# Patient Record
Sex: Female | Born: 1960 | Race: Black or African American | Hispanic: No | Marital: Married | Smoking: Never smoker
Health system: Southern US, Community
[De-identification: ages and names within clinical notes are randomized; demographics above are authoritative.]

## PROBLEM LIST (undated history)

## (undated) DIAGNOSIS — T7840XA Allergy, unspecified, initial encounter: Secondary | ICD-10-CM

## (undated) DIAGNOSIS — G4730 Sleep apnea, unspecified: Secondary | ICD-10-CM

## (undated) DIAGNOSIS — J45909 Unspecified asthma, uncomplicated: Secondary | ICD-10-CM

## (undated) DIAGNOSIS — I10 Essential (primary) hypertension: Secondary | ICD-10-CM

## (undated) DIAGNOSIS — M1990 Unspecified osteoarthritis, unspecified site: Secondary | ICD-10-CM

## (undated) HISTORY — DX: Unspecified osteoarthritis, unspecified site: M19.90

## (undated) HISTORY — DX: Allergy, unspecified, initial encounter: T78.40XA

## (undated) HISTORY — PX: BREAST BIOPSY: SHX20

## (undated) HISTORY — PX: OTHER SURGICAL HISTORY: SHX169

## (undated) HISTORY — DX: Sleep apnea, unspecified: G47.30

## (undated) HISTORY — PX: TRIGGER FINGER RELEASE: SHX641

## (undated) HISTORY — DX: Essential (primary) hypertension: I10

## (undated) HISTORY — DX: Unspecified asthma, uncomplicated: J45.909

---

## 2012-10-27 LAB — HEPATIC FUNCTION PANEL: ALT: 18 (ref 7–35)

## 2012-10-27 LAB — LIPID PANEL
CHOLESTEROL: 156 (ref 0–200)
HDL: 39 (ref 35–70)
LDL CALC: 103
TRIGLYCERIDES: 68 (ref 40–160)

## 2012-10-27 LAB — BASIC METABOLIC PANEL
Creatinine: 0.8 (ref 0.5–1.1)
GLUCOSE: 89
Potassium: 3.8 (ref 3.4–5.3)
SODIUM: 143 (ref 137–147)

## 2012-10-27 LAB — CBC AND DIFFERENTIAL
HEMATOCRIT: 43 (ref 36–46)
HEMOGLOBIN: 15.3 (ref 12.0–16.0)
PLATELETS: 292 (ref 150–399)
WBC: 5.8

## 2012-10-27 LAB — HEMOGLOBIN A1C: Hemoglobin A1C: 4.8

## 2014-05-05 LAB — HM MAMMOGRAPHY

## 2016-11-12 ENCOUNTER — Ambulatory Visit (INDEPENDENT_AMBULATORY_CARE_PROVIDER_SITE_OTHER): Payer: Managed Care, Other (non HMO) | Admitting: Family Medicine

## 2016-11-12 ENCOUNTER — Encounter: Payer: Self-pay | Admitting: Family Medicine

## 2016-11-12 VITALS — BP 130/80 | HR 95 | Resp 12 | Ht 62.0 in | Wt 201.5 lb

## 2016-11-12 DIAGNOSIS — Z6837 Body mass index (BMI) 37.0-37.9, adult: Secondary | ICD-10-CM | POA: Insufficient documentation

## 2016-11-12 DIAGNOSIS — Z9989 Dependence on other enabling machines and devices: Secondary | ICD-10-CM

## 2016-11-12 DIAGNOSIS — I10 Essential (primary) hypertension: Secondary | ICD-10-CM | POA: Diagnosis not present

## 2016-11-12 DIAGNOSIS — J309 Allergic rhinitis, unspecified: Secondary | ICD-10-CM

## 2016-11-12 DIAGNOSIS — G4733 Obstructive sleep apnea (adult) (pediatric): Secondary | ICD-10-CM | POA: Insufficient documentation

## 2016-11-12 DIAGNOSIS — E669 Obesity, unspecified: Secondary | ICD-10-CM | POA: Insufficient documentation

## 2016-11-12 DIAGNOSIS — J302 Other seasonal allergic rhinitis: Secondary | ICD-10-CM | POA: Insufficient documentation

## 2016-11-12 DIAGNOSIS — E876 Hypokalemia: Secondary | ICD-10-CM

## 2016-11-12 DIAGNOSIS — E559 Vitamin D deficiency, unspecified: Secondary | ICD-10-CM | POA: Diagnosis not present

## 2016-11-12 DIAGNOSIS — Z6836 Body mass index (BMI) 36.0-36.9, adult: Secondary | ICD-10-CM

## 2016-11-12 DIAGNOSIS — J3089 Other allergic rhinitis: Secondary | ICD-10-CM

## 2016-11-12 DIAGNOSIS — J4540 Moderate persistent asthma, uncomplicated: Secondary | ICD-10-CM

## 2016-11-12 LAB — BASIC METABOLIC PANEL
BUN: 11 mg/dL (ref 6–23)
CHLORIDE: 107 meq/L (ref 96–112)
CO2: 30 meq/L (ref 19–32)
CREATININE: 0.53 mg/dL (ref 0.40–1.20)
Calcium: 9.3 mg/dL (ref 8.4–10.5)
GFR: 155.07 mL/min (ref >60.00)
Glucose, Bld: 73 mg/dL (ref 70–99)
POTASSIUM: 3.7 meq/L (ref 3.5–5.1)
Sodium: 143 mEq/L (ref 135–145)

## 2016-11-12 LAB — VITAMIN D 25 HYDROXY (VIT D DEFICIENCY, FRACTURES): VITD: 33.06 ng/mL (ref 30.00–100.00)

## 2016-11-12 MED ORDER — ALBUTEROL SULFATE HFA 108 (90 BASE) MCG/ACT IN AERS: 2.0000 | INHALATION_SPRAY | Freq: Four times a day (QID) | RESPIRATORY_TRACT | 1 refills | Status: DC | PRN

## 2016-11-12 MED ORDER — FLUTICASONE-SALMETEROL 250-50 MCG/DOSE IN AEPB: 1.0000 | INHALATION_SPRAY | Freq: Two times a day (BID) | RESPIRATORY_TRACT | 2 refills | Status: DC

## 2016-11-12 NOTE — Progress Notes (Signed)
Pre visit review using our clinic review tool, if applicable. No additional management support is needed unless otherwise documented below in the visit note. 

## 2016-11-12 NOTE — Progress Notes (Signed)
HPI:   [name] is a 56 y.o. female, who is here today to establish care with me.  Former PCP: [place], she just moved to the area in 06/2016. Last preventive routine visit: 11/2015. Pap smear 2017 negative. Colonoscopy at age 28, negative per pt report.   Chronic medical problems: HTN,asthma,OSA,allergies, vit D deficiency among some.  She lives with her husband. Her son lives in [place].  Concerns today: Med refills.   Hypertension:  Dx in her mid 89's.  Currently on HCTZ 25 mg daily and amlodipine 10 mg daily. HypoK+ on KCL 10 meq daily.   She is taking medications as instructed, no side effects reported.  She has not noted unusual headache, visual changes, exertional chest pain, dyspnea,  focal weakness, or edema.  She has not been consistent with regular exercise and a healthy diet since she moved, October last year, planning on going back to a healthier lifestyle.  OSA on CPAP.  Vitamin D deficiency: Currently she is on OTC vitamin D supplementation 2000 units daily.  Asthma: Currently she is on albuterol inhaler, which she uses as needed. Last exacerbation after about a year of not having one was about 1-2 weeks ago. She is on Advair 250-50 mcg bid.  She is also on Singulair 10 mg daily and Flonase intranasal spray daily. She takes Allegra and 80 mg daily.    Review of Systems  Constitutional: Negative for activity change, appetite change, fatigue, fever and unexpected weight change.  HENT: Negative for mouth sores, nosebleeds, sinus pain, sore throat, trouble swallowing and voice change.   Eyes: Negative for pain, redness and visual disturbance.  Respiratory: Negative for cough, shortness of breath and wheezing.   Cardiovascular: Negative for chest pain, palpitations and leg swelling.  Gastrointestinal: Negative for abdominal pain, nausea and vomiting.       Negative for changes in bowel habits.  Genitourinary: Negative for  decreased urine volume and hematuria.  Musculoskeletal: Negative for gait problem and myalgias.  Skin: Negative for rash.  Allergic/Immunologic: Positive for environmental allergies.  Neurological: Negative for syncope, weakness and headaches.  Psychiatric/Behavioral: Negative for confusion. The patient is not nervous/anxious.       No current outpatient prescriptions on file prior to visit.   No current facility-administered medications on file prior to visit.      Past Medical History:  Diagnosis Date  . Allergy   . Hypertension    Not on File  Family History  Problem Relation Age of Onset  . Hyperlipidemia Mother   . Hypertension Mother   . Hypertension Father   . Hyperlipidemia Father   . Diabetes Sister   . Cancer Sister     breast  . Diabetes Sister   . Cancer Sister     breast    Social History   Social History  . Marital status: Married    Spouse name: N/A  . Number of children: N/A  . Years of education: N/A   Social History Main Topics  . Smoking status: Never Smoker  . Smokeless tobacco: Never Used  . Alcohol use None  . Drug use: No  . Sexual activity: Yes   Other Topics Concern  . None   Social History Narrative  . None    Vitals:   11/12/16 0855  BP: 130/80  Pulse: 95  Resp: 12  O2 sat at RA 98%.  Body mass index is 36.85 kg/m.   Physical Exam  Nursing note and  vitals reviewed. Constitutional: She is oriented to person, place, and time. She appears well-developed. No distress.  HENT:  Head: Atraumatic.  Mouth/Throat: Oropharynx is clear and moist and mucous membranes are normal.  Hypertrophic turbinates.  Eyes: Conjunctivae and EOM are normal. Pupils are equal, round, and reactive to light.  Cardiovascular: Normal rate and regular rhythm.   No murmur heard. Pulses:      Dorsalis pedis pulses are 2+ on the right side, and 2+ on the left side.  Respiratory: Effort normal and breath sounds normal. No respiratory distress.    GI: Soft. She exhibits no mass. There is no hepatomegaly. There is no tenderness.  Musculoskeletal: She exhibits no edema or tenderness.  Lymphadenopathy:    She has no cervical adenopathy.  Neurological: She is alert and oriented to person, place, and time. She has normal strength. Coordination and gait normal.  Skin: Skin is warm. No erythema.  Psychiatric: She has a normal mood and affect.  Well groomed, good eye contact.      ASSESSMENT AND PLAN:   Wanda Thornton was seen today for establish care.  Diagnoses and all orders for this visit:  Asthma, extrinsic, moderate persistent, uncomplicated  Overall well controlled. No changes in current management. Given her history of OSA, referral to pulmonology was placed.  -     Ambulatory referral to Pulmonology -     albuterol (PROVENTIL HFA;VENTOLIN HFA) 108 (90 Base) MCG/ACT inhaler; Inhale 2 puffs into the lungs every 6 (six) hours as needed for wheezing or shortness of breath. -     Fluticasone-Salmeterol (ADVAIR) 250-50 MCG/DOSE AEPB; Inhale 1 puff into the lungs 2 (two) times daily.  Vitamin D deficiency  No changes in current management, will follow labs done today and will give further recommendations accordingly.  -     VITAMIN D 25 Hydroxy (Vit-D Deficiency, Fractures)  Hypertension, essential, benign  Adequately controlled. No changes in current management. DASH-low salt diet recommended. Eye exam recommended annually. F/U in 6 months, before if needed.  -     Basic metabolic panel  Chronic allergic rhinitis, unspecified seasonality, unspecified trigger  Stable. Continue current management. Follow-up in 6 months.  OSA on CPAP  She will continue following with pulmonologist.  -     Ambulatory referral to Pulmonology  BMI 36.0-36.9,adult  We discussed benefits of wt loss as well as adverse effects of obesity. Consistency with healthy diet and physical activity recommended.  Hypokalemia  No changes in  current management, will follow labs done today and will give further recommendations accordingly.     Betty G. Martinique, MD  [facility]. [place] office.

## 2016-11-12 NOTE — Patient Instructions (Signed)
A few things to remember from today's visit:   OSA on CPAP - Plan: Ambulatory referral to Pulmonology  Vitamin D deficiency - Plan: VITAMIN D 25 Hydroxy (Vit-D Deficiency, Fractures)  Asthma, extrinsic, moderate persistent, uncomplicated - Plan: Ambulatory referral to Pulmonology, albuterol (PROVENTIL HFA;VENTOLIN HFA) 108 (90 Base) MCG/ACT inhaler, Fluticasone-Salmeterol (ADVAIR) 250-50 MCG/DOSE AEPB  Chronic allergic rhinitis, unspecified seasonality, unspecified trigger  Hypertension, essential, benign - Plan: Basic metabolic panel  No changes today.  We have ordered labs or studies at this visit.  It can take up to 1-2 weeks for results and processing. IF results require follow up or explanation, we will call you with instructions. Clinically stable results will be released to your [facility]. If you have not heard from [place] or cannot find your results in [facility] in 2 weeks please contact our office at [phone].  If you are not yet signed up for [facility], please consider signing up  Please be sure medication list is accurate. If a new problem present, please set up appointment sooner than planned today.

## 2016-12-25 ENCOUNTER — Institutional Professional Consult (permissible substitution): Payer: Managed Care, Other (non HMO) | Admitting: Pulmonary Disease

## 2017-02-05 ENCOUNTER — Other Ambulatory Visit: Payer: Self-pay

## 2017-02-05 DIAGNOSIS — J4540 Moderate persistent asthma, uncomplicated: Secondary | ICD-10-CM

## 2017-02-05 MED ORDER — FLUTICASONE PROPIONATE 50 MCG/ACT NA SUSP: 2.0000 | Freq: Every day | NASAL | 1 refills | Status: DC

## 2017-02-05 MED ORDER — FLUTICASONE-SALMETEROL 250-50 MCG/DOSE IN AEPB: 1.0000 | INHALATION_SPRAY | Freq: Two times a day (BID) | RESPIRATORY_TRACT | 1 refills | Status: DC

## 2017-02-13 ENCOUNTER — Other Ambulatory Visit (INDEPENDENT_AMBULATORY_CARE_PROVIDER_SITE_OTHER): Payer: 59

## 2017-02-13 ENCOUNTER — Ambulatory Visit (INDEPENDENT_AMBULATORY_CARE_PROVIDER_SITE_OTHER): Payer: 59 | Admitting: Internal Medicine

## 2017-02-13 ENCOUNTER — Encounter: Payer: Self-pay | Admitting: Internal Medicine

## 2017-02-13 VITALS — BP 128/88 | HR 90 | Resp 16 | Ht 62.0 in | Wt 206.8 lb

## 2017-02-13 DIAGNOSIS — G4733 Obstructive sleep apnea (adult) (pediatric): Secondary | ICD-10-CM

## 2017-02-13 DIAGNOSIS — J4540 Moderate persistent asthma, uncomplicated: Secondary | ICD-10-CM

## 2017-02-13 DIAGNOSIS — J3089 Other allergic rhinitis: Secondary | ICD-10-CM | POA: Diagnosis not present

## 2017-02-13 DIAGNOSIS — J4530 Mild persistent asthma, uncomplicated: Secondary | ICD-10-CM | POA: Diagnosis not present

## 2017-02-13 DIAGNOSIS — J302 Other seasonal allergic rhinitis: Secondary | ICD-10-CM

## 2017-02-13 DIAGNOSIS — Z9989 Dependence on other enabling machines and devices: Secondary | ICD-10-CM | POA: Diagnosis not present

## 2017-02-13 LAB — CBC WITH DIFFERENTIAL/PLATELET
BASOS ABS: 0.1 10*3/uL (ref 0.0–0.1)
Basophils Relative: 1.1 % (ref 0.0–3.0)
Eosinophils Absolute: 0.3 10*3/uL (ref 0.0–0.7)
Eosinophils Relative: 3.1 % (ref 0.0–5.0)
HCT: 43.5 % (ref 36.0–46.0)
Hemoglobin: 15.3 g/dL — ABNORMAL HIGH (ref 12.0–15.0)
LYMPHS PCT: 26.5 % (ref 12.0–46.0)
Lymphs Abs: 2.5 10*3/uL (ref 0.7–4.0)
MCHC: 35.1 g/dL (ref 30.0–36.0)
MCV: 89.7 fl (ref 78.0–100.0)
Monocytes Absolute: 0.8 10*3/uL (ref 0.1–1.0)
Monocytes Relative: 8.4 % (ref 3.0–12.0)
NEUTROS PCT: 60.9 % (ref 43.0–77.0)
Neutro Abs: 5.8 10*3/uL (ref 1.4–7.7)
PLATELETS: 332 10*3/uL (ref 150.0–400.0)
RBC: 4.85 Mil/uL (ref 3.87–5.11)
RDW: 13.9 % (ref 11.5–15.5)
WBC: 9.6 10*3/uL (ref 4.0–10.5)

## 2017-02-13 NOTE — Patient Instructions (Addendum)
Please see if you can get [place] contact information for [facility] so we can request a copy of your original diagnostic HST sleep study from around 2013. We will have you sign a record release.  Order- new DME to continue CPAP auto 5-15, mask of choice, humidifier, supplies, AirView or download card  Dx OSA  Order- office spirometry    Dx Asthma mild persistent  Order lab- CBC w diff, Allergy profile      Dx Asthma mild persistent

## 2017-02-13 NOTE — Assessment & Plan Note (Signed)
We are requesting her original diagnostic sleep study results for documentation. She is well-controlled and clearly benefiting from CPAP with no changes required. She does need to establish with a DME company. Plan-continue CPAP auto 5-15

## 2017-02-13 NOTE — Assessment & Plan Note (Signed)
Current control measures with environmental precautions, Flonase, Singulair and Zyrte appear to be sufficient

## 2017-02-13 NOTE — Progress Notes (Signed)
02/13/17-56 year old female never smoker consult pulmonary to est care with Dr. Annamaria Boots referring Dr is Dr. Betty Martinique. Medical history of asthma.and obstructive sleep apnea. We have requested diagnostic npsg records. Original home sleep test in [place] at [place] around 2013. She had an unattended sleep study in 2015 but couldn't sleep. Original problems were loud witnessed snoring, nonrestorative sleep and daytime sleepiness. She has been using CPAP since 2013 continuously and reports she sleeps well with this with no snoring and much less daytime sleepiness. Download indicates 100% 4 hour compliance with pressure auto 5-15 and AHI 1.4/hour. Nasal mask. This machine is about 13-year-old. She does need to establish with a local DME company for supplies. ENT surgery-none. She denies history of heart disease. Second Problem: Asthma-adult onset, associated with environmental allergy in [facility] including olive trees. Skin testing was positive for grass, trees and weeds. Qvar was insufficient but Advair 250 controls her quite well, needing rescue inhaler only rarely. She recognizes seasonal and perennial allergic rhinitis symptoms with itching of eyes and nose, nasal congestion and drainage. These are usually controlled with Flonase, Singulair and Zyrtec. 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 Office Spirometry-  Prior to Admission medications   Medication Sig Start Date End Date Taking? Authorizing Provider  albuterol (PROVENTIL HFA;VENTOLIN HFA) 108 (90 Base) MCG/ACT inhaler Inhale 2 puffs into the lungs every 6 (six) hours as needed for wheezing or shortness of breath. 11/12/16  Yes Martinique, Betty G, MD  amLODipine (NORVASC) 10 MG tablet Take 10 mg by mouth daily.   Yes [provider]  CALCIUM CITRATE PO Take 2 tablets by mouth 2 (two) times daily.   Yes [provider]  fexofenadine (ALLEGRA) 180  MG tablet Take 180 mg by mouth daily.   Yes [provider]  fluticasone (FLONASE) 50 MCG/ACT nasal spray Place 2 sprays into both nostrils daily. 02/05/17  Yes Martinique, Betty G, MD  Fluticasone-Salmeterol (ADVAIR) 250-50 MCG/DOSE AEPB Inhale 1 puff into the lungs 2 (two) times daily. 02/05/17  Yes Martinique, Betty G, MD  hydrochlorothiazide (HYDRODIURIL) 25 MG tablet Take 25 mg by mouth daily.   Yes [provider]  montelukast (SINGULAIR) 10 MG tablet Take 10 mg by mouth at bedtime.   Yes [provider]  potassium chloride (K-DUR) 10 MEQ tablet Take 10 mEq by mouth 2 (two) times daily.   Yes [provider]   Past Medical History:  Diagnosis Date  . Allergy   . Hypertension    No past surgical history on file. Family History  Problem Relation Age of Onset  . Hyperlipidemia Mother   . Hypertension Mother   . Hypertension Father   . Hyperlipidemia Father   . Diabetes Sister   . Cancer Sister        breast  . Diabetes Sister   . Cancer Sister        breast   Social History   Social History  . Marital status: Married    Spouse name: N/A  . Number of children: N/A  . Years of education: N/A   Occupational History  . Not on file.   Social History Main Topics  . Smoking status: Never Smoker  . Smokeless tobacco: Never Used  . Alcohol use No  . Drug use: No  . Sexual activity: Yes   Other Topics Concern  . Not on file   Social History Narrative  .  No narrative on file   ROS-see HPI    "+" = POS Constitutional:    weight loss, night sweats, fevers, chills, fatigue, lassitude. HEENT:    headaches, difficulty swallowing, tooth/dental problems, sore throat,       + sneezing, itching, ear ache, + nasal congestion, post nasal drip, snoring CV:    chest pain, orthopnea, PND, swelling in lower extremities, anasarca,                                                         dizziness, palpitations Resp:   + shortness of breath with exertion or at  rest.                productive cough,   non-productive cough, coughing up of blood.              change in color of mucus.  wheezing.   Skin:    rash or lesions. GI:  No-   heartburn, indigestion, abdominal pain, nausea, vomiting, diarrhea,                 change in bowel habits, loss of appetite GU: dysuria, change in color of urine, no urgency or frequency.   flank pain. MS:   joint pain, stiffness, decreased range of motion, back pain. Neuro-     nothing unusual Psych:  change in mood or affect.  depression or anxiety.   memory loss.  OBJ- Physical Exam General- Alert, Oriented, Affect-appropriate, Distress- none acute, + overweight Skin- rash-none, lesions- none, excoriation- none Lymphadenopathy- none Head- atraumatic            Eyes- Gross vision intact, PERRLA, conjunctivae and secretions clear            Ears- Hearing, canals-normal            Nose- + turbinate edema, no-Septal dev, mucus, polyps, erosion, perforation             Throat- Mallampati III-IV , mucosa clear , drainage- none, tonsils- atrophic Neck- flexible , trachea midline, no stridor , thyroid nl, carotid no bruit Chest - symmetrical excursion , unlabored           Heart/CV- RRR , no murmur , no gallop  , no rub, nl s1 s2                           - JVD- none , edema- none, stasis changes- none, varices- none           Lung- clear to P&A, wheeze- none, cough- none , dullness-none, rub- none           Chest wall-  Abd-  Br/ Gen/ Rectal- Not done, not indicated Extrem- cyanosis- none, clubbing, none, atrophy- none, strength- nl Neuro- grossly intact to observation

## 2017-02-13 NOTE — Assessment & Plan Note (Signed)
Control has been good with Advair and occasional use of rescue inhaler. Plan-Office Spirometry, labs to establish importance of allergic triggers-CBC with differential and allergy profile

## 2017-02-14 LAB — RESPIRATORY ALLERGY PROFILE REGION II ~~LOC~~
Allergen, C. Herbarum, M2: <0.1 kU/L
Allergen, Comm Silver Birch, t9: <0.1 kU/L
Allergen, Cottonwood, t14: <0.1 kU/L
Allergen, D pternoyssinus,d7: <0.1 kU/L
Allergen, Mulberry, t76: <0.1 kU/L
Allergen, P. notatum, m1: <0.1 kU/L
Aspergillus fumigatus, m3: <0.1 kU/L
Bermuda Grass: <0.1 kU/L
Cockroach: <0.1 kU/L
Common Ragweed: <0.1 kU/L
Dog Dander: <0.1 kU/L
Elm IgE: 0.14 kU/L — ABNORMAL HIGH
IgE (Immunoglobulin E), Serum: 66 kU/L (ref <115)
Johnson Grass: <0.1 kU/L
Pecan/Hickory Tree IgE: <0.1 kU/L
Rough Pigweed  IgE: <0.1 kU/L
Timothy Grass: 2.74 kU/L — ABNORMAL HIGH

## 2017-02-14 NOTE — Addendum Note (Signed)
Addended by: Parke Poisson E on: 02/14/2017 04:28 PM   Modules accepted: Orders

## 2017-02-14 NOTE — Addendum Note (Signed)
Addended by: Clayborne Dana C on: 02/14/2017 02:50 PM   Modules accepted: Orders

## 2017-02-21 ENCOUNTER — Telehealth: Payer: Self-pay | Admitting: Internal Medicine

## 2017-02-21 NOTE — Telephone Encounter (Signed)
Notes recorded by Deneise Lever, MD on 02/17/2017 at 10:31 AM EDT Allergy labs- - increased allergy antibodies to grass pollen and Elm tree pollen. Total allergy antibodies are not very high. This suggests symptoms may bother mainly in Spring pollen season, but shouldn't be a big issue interfering with CPAP.   Spoke with patient regarding results. Patient verbalized understanding. Nothing further was needed at time of call.

## 2017-05-06 ENCOUNTER — Other Ambulatory Visit: Payer: Self-pay

## 2017-05-06 DIAGNOSIS — J4540 Moderate persistent asthma, uncomplicated: Secondary | ICD-10-CM

## 2017-05-06 MED ORDER — MONTELUKAST SODIUM 10 MG PO TABS: 10.0000 mg | ORAL_TABLET | Freq: Every day | ORAL | 2 refills | Status: DC

## 2017-05-26 ENCOUNTER — Telehealth: Payer: Self-pay | Admitting: Family Medicine

## 2017-05-26 NOTE — Telephone Encounter (Signed)
Pt brought disk of medical records for Wanda Thornton to copy. Please call pt when done so she can come pick it up. [phone]. cb

## 2017-05-28 NOTE — Telephone Encounter (Signed)
Pts husband state the pass code is her date birth

## 2017-06-03 ENCOUNTER — Encounter: Payer: Self-pay | Admitting: Family Medicine

## 2017-06-03 NOTE — Telephone Encounter (Signed)
Pt husband came by and picked up CD's.

## 2017-06-03 NOTE — Telephone Encounter (Signed)
I called and let patient's husband know that the CD's are up front & ready to be picked up.

## 2017-06-17 ENCOUNTER — Telehealth: Payer: Self-pay

## 2017-06-17 ENCOUNTER — Ambulatory Visit (INDEPENDENT_AMBULATORY_CARE_PROVIDER_SITE_OTHER): Payer: 59 | Admitting: Internal Medicine

## 2017-06-17 ENCOUNTER — Encounter: Payer: Self-pay | Admitting: Internal Medicine

## 2017-06-17 DIAGNOSIS — J302 Other seasonal allergic rhinitis: Secondary | ICD-10-CM | POA: Diagnosis not present

## 2017-06-17 DIAGNOSIS — Z23 Encounter for immunization: Secondary | ICD-10-CM

## 2017-06-17 DIAGNOSIS — G4733 Obstructive sleep apnea (adult) (pediatric): Secondary | ICD-10-CM | POA: Diagnosis not present

## 2017-06-17 DIAGNOSIS — J4540 Moderate persistent asthma, uncomplicated: Secondary | ICD-10-CM

## 2017-06-17 DIAGNOSIS — Z1231 Encounter for screening mammogram for malignant neoplasm of breast: Secondary | ICD-10-CM

## 2017-06-17 DIAGNOSIS — Z9989 Dependence on other enabling machines and devices: Secondary | ICD-10-CM

## 2017-06-17 DIAGNOSIS — J3089 Other allergic rhinitis: Secondary | ICD-10-CM

## 2017-06-17 MED ORDER — ALBUTEROL SULFATE HFA 108 (90 BASE) MCG/ACT IN AERS: 2.0000 | INHALATION_SPRAY | Freq: Four times a day (QID) | RESPIRATORY_TRACT | 3 refills | Status: DC | PRN

## 2017-06-17 NOTE — Assessment & Plan Note (Signed)
She was recently back in [place] where local environmental triggers were again noted. Back in [place], she is mostly noticing spring seasonal rhinitis symptoms which is consistent with her allergy profile blood work. Plan-symptomatic therapy with Flonase and Claritin as needed.

## 2017-06-17 NOTE — Telephone Encounter (Signed)
Order can be placed. She can have it done wherever is more convenient for her. I know [facility].  Thanks, BJ

## 2017-06-17 NOTE — Assessment & Plan Note (Signed)
She is describing mild, intermittent, uncomplicated asthma pattern currently. Advair works well for her.

## 2017-06-17 NOTE — Assessment & Plan Note (Signed)
She sleeps better with CPAP and download confirms excellent compliance and control. No changes are needed. Plan-continue auto 5-15

## 2017-06-17 NOTE — Progress Notes (Signed)
[birth year]/17-56 year old female never smoker consult pulmonary to est care with Dr. Annamaria Boots referring Dr is Dr. Betty Martinique. Medical history of asthma.and obstructive sleep apnea. We have requested diagnostic npsg records. Original home sleep test in [place] at [place] around 2013. She had an unattended sleep study in 2015 but couldn't sleep. Original problems were loud witnessed snoring, nonrestorative sleep and daytime sleepiness. She has been using CPAP since 2013 continuously and reports she sleeps well with this with no snoring and much less daytime sleepiness. Download indicates 100% 4 hour compliance with pressure auto 5-15 and AHI 1.4/hour. Nasal mask. This machine is about 40-year-old. She does need to establish with a local DME company for supplies. ENT surgery-none. She denies history of heart disease. Second Problem: Asthma-adult onset, associated with environmental allergy in [facility] including olive trees. Skin testing was positive for grass, trees and weeds. Qvar was insufficient but Advair 250 controls her quite well, needing rescue inhaler only rarely. She recognizes seasonal and perennial allergic rhinitis symptoms with itching of eyes and nose, nasal congestion and drainage. These are usually controlled with Flonase, Singulair and Zyrtec. 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 Office Spirometry- WNL ratio 90%  06/17/17-56 year old female never smoker followed for asthma, allergic rhinitis,  OSA CPAP auto 5-15/ FOLLOWS FOR: DME: [facility]. Pt wears CPAP nightly. DL attached.  Allergy Profile 02/13/17- total IgE 66, specific elevations for Timothy grass and Elm pollen. Eosinophils normal 0.3 K/UL Download confirms 100% compliance averaging 7 hours 46 minutes with AHI 1.6/hour. She says she sleeps "so much better" with CPAP. No more daytime sleepiness. She still feels Advair doesn't nice job of controlling  asthma. No recent wheezing. She has Flonase and Claritin available if needed for spring allergy season.  ROS-see HPI    "+" = POS Constitutional:    weight loss, night sweats, fevers, chills, fatigue, lassitude. HEENT:    headaches, difficulty swallowing, tooth/dental problems, sore throat,        sneezing, itching, ear ache,  nasal congestion, post nasal drip, snoring CV:    chest pain, orthopnea, PND, swelling in lower extremities, anasarca,                                                         dizziness, palpitations Resp:   + shortness of breath with exertion or at rest.                productive cough,   non-productive cough, coughing up of blood.              change in color of mucus.  wheezing.   Skin:    rash or lesions. GI:  No-   heartburn, indigestion, abdominal pain, nausea, vomiting, diarrhea,                 change in bowel habits, loss of appetite GU: dysuria, change in color of urine, no urgency or frequency.   flank pain. MS:   joint pain, stiffness, decreased range of motion, back pain. Neuro-     nothing unusual Psych:  change in mood or affect.  depression or anxiety.   memory loss.  OBJ- Physical Exam  stable baseline exam. She looks comfortable. General- Alert, Oriented, Affect-appropriate, Distress- none  acute, + overweight Skin- rash-none, lesions- none, excoriation- none Lymphadenopathy- none Head- atraumatic            Eyes- Gross vision intact, PERRLA, conjunctivae and secretions clear            Ears- Hearing, canals-normal            Nose- + turbinate edema, no-Septal dev, mucus, polyps, erosion, perforation             Throat- Mallampati III-IV , mucosa clear , drainage- none, tonsils- atrophic Neck- flexible , trachea midline, no stridor , thyroid nl, carotid no bruit Chest - symmetrical excursion , unlabored           Heart/CV- RRR , no murmur , no gallop  , no rub, nl s1 s2                           - JVD- none , edema- none, stasis changes- none,  varices- none           Lung- clear to P&A, wheeze- none, cough- none , dullness-none, rub- none           Chest wall-  Abd-  Br/ Gen/ Rectal- Not done, not indicated Extrem- cyanosis- none, clubbing, none, atrophy- none, strength- nl Neuro- grossly intact to observation

## 2017-06-17 NOTE — Telephone Encounter (Signed)
Patient called to state that she is due for annual mammogram. She would like to know where you would recommend she go and if you could order.  Dr. Martinique - Please advise. Thanks!

## 2017-06-17 NOTE — Patient Instructions (Signed)
Flu vax standard  Script sent refilling ProAir  Ok to continue Advair  We can continue CPAP auto 5-15, mask of choice, humidifier, supplies, AirView for dx OSA  Please call if we can help

## 2017-06-18 NOTE — Telephone Encounter (Signed)
LMTCB to find out if pt has preference for imaging location for mammo

## 2017-06-18 NOTE — Telephone Encounter (Signed)
PT called back and had no preference. Order placed with [facility]. Pt aware someone will call her to schedule. Nothing further needed at this time.

## 2017-06-30 ENCOUNTER — Ambulatory Visit
Admission: RE | Admit: 2017-06-30 | Discharge: 2017-06-30 | Disposition: A | Discharge status: Home / Self Care | Payer: 59 | Source: Ambulatory Visit | Attending: Family Medicine | Admitting: Family Medicine

## 2017-06-30 DIAGNOSIS — Z1231 Encounter for screening mammogram for malignant neoplasm of breast: Secondary | ICD-10-CM

## 2017-08-04 ENCOUNTER — Other Ambulatory Visit: Payer: Self-pay | Admitting: Family Medicine

## 2017-08-14 ENCOUNTER — Other Ambulatory Visit: Payer: Self-pay | Admitting: Family Medicine

## 2017-08-14 DIAGNOSIS — J4540 Moderate persistent asthma, uncomplicated: Secondary | ICD-10-CM

## 2017-08-15 ENCOUNTER — Other Ambulatory Visit: Payer: Self-pay | Admitting: *Deleted

## 2017-08-15 DIAGNOSIS — E876 Hypokalemia: Secondary | ICD-10-CM

## 2017-08-15 DIAGNOSIS — I10 Essential (primary) hypertension: Secondary | ICD-10-CM

## 2017-08-15 MED ORDER — POTASSIUM CHLORIDE ER 10 MEQ PO TBCR: 10.0000 meq | EXTENDED_RELEASE_TABLET | Freq: Two times a day (BID) | ORAL | 2 refills | Status: DC

## 2017-08-15 MED ORDER — HYDROCHLOROTHIAZIDE 25 MG PO TABS: 25.0000 mg | ORAL_TABLET | Freq: Every day | ORAL | 2 refills | Status: DC

## 2017-08-15 MED ORDER — AMLODIPINE BESYLATE 10 MG PO TABS: 10.0000 mg | ORAL_TABLET | Freq: Every day | ORAL | 2 refills | Status: DC

## 2017-08-26 ENCOUNTER — Other Ambulatory Visit: Payer: Self-pay | Admitting: *Deleted

## 2017-08-26 DIAGNOSIS — E876 Hypokalemia: Secondary | ICD-10-CM

## 2017-08-26 MED ORDER — POTASSIUM CHLORIDE ER 10 MEQ PO TBCR: 10.0000 meq | EXTENDED_RELEASE_TABLET | Freq: Two times a day (BID) | ORAL | 2 refills | Status: DC

## 2018-01-26 ENCOUNTER — Other Ambulatory Visit: Payer: Self-pay | Admitting: Family Medicine

## 2018-01-26 DIAGNOSIS — J4540 Moderate persistent asthma, uncomplicated: Secondary | ICD-10-CM

## 2018-02-21 ENCOUNTER — Other Ambulatory Visit: Payer: Self-pay | Admitting: Family Medicine

## 2018-02-21 DIAGNOSIS — J4540 Moderate persistent asthma, uncomplicated: Secondary | ICD-10-CM

## 2018-03-11 ENCOUNTER — Encounter: Payer: Self-pay | Admitting: Family Medicine

## 2018-03-11 ENCOUNTER — Ambulatory Visit (INDEPENDENT_AMBULATORY_CARE_PROVIDER_SITE_OTHER): Payer: 59 | Admitting: Family Medicine

## 2018-03-11 VITALS — BP 120/82 | HR 95 | Temp 98.3°F | Resp 12 | Ht 62.0 in | Wt 200.5 lb

## 2018-03-11 DIAGNOSIS — Z13220 Encounter for screening for lipoid disorders: Secondary | ICD-10-CM | POA: Diagnosis not present

## 2018-03-11 DIAGNOSIS — J4540 Moderate persistent asthma, uncomplicated: Secondary | ICD-10-CM

## 2018-03-11 DIAGNOSIS — Z0000 Encounter for general adult medical examination without abnormal findings: Secondary | ICD-10-CM | POA: Diagnosis not present

## 2018-03-11 DIAGNOSIS — Z9189 Other specified personal risk factors, not elsewhere classified: Secondary | ICD-10-CM | POA: Diagnosis not present

## 2018-03-11 DIAGNOSIS — I10 Essential (primary) hypertension: Secondary | ICD-10-CM

## 2018-03-11 DIAGNOSIS — Z1159 Encounter for screening for other viral diseases: Secondary | ICD-10-CM | POA: Diagnosis not present

## 2018-03-11 DIAGNOSIS — Z131 Encounter for screening for diabetes mellitus: Secondary | ICD-10-CM

## 2018-03-11 LAB — COMPREHENSIVE METABOLIC PANEL
ALBUMIN: 4.3 g/dL (ref 3.5–5.2)
ALT: 15 U/L (ref 0–35)
AST: 16 U/L (ref 0–37)
Alkaline Phosphatase: 105 U/L (ref 39–117)
BUN: 12 mg/dL (ref 6–23)
CALCIUM: 9.4 mg/dL (ref 8.4–10.5)
CHLORIDE: 103 meq/L (ref 96–112)
CO2: 30 meq/L (ref 19–32)
Creatinine, Ser: 0.71 mg/dL (ref 0.40–1.20)
GFR: 110.11 mL/min (ref >60.00)
Glucose, Bld: 90 mg/dL (ref 70–99)
POTASSIUM: 3.4 meq/L — AB (ref 3.5–5.1)
Sodium: 142 mEq/L (ref 135–145)
Total Bilirubin: 1 mg/dL (ref 0.2–1.2)
Total Protein: 7.1 g/dL (ref 6.0–8.3)

## 2018-03-11 LAB — LIPID PANEL
CHOLESTEROL: 170 mg/dL (ref 0–200)
HDL: 36.5 mg/dL — AB (ref >39.00)
LDL CALC: 117 mg/dL — AB (ref 0–99)
NonHDL: 133.39
TRIGLYCERIDES: 83 mg/dL (ref 0.0–149.0)
Total CHOL/HDL Ratio: 5
VLDL: 16.6 mg/dL (ref 0.0–40.0)

## 2018-03-11 LAB — TSH: TSH: 1.45 u[IU]/mL (ref 0.35–4.50)

## 2018-03-11 MED ORDER — MONTELUKAST SODIUM 10 MG PO TABS: 10.0000 mg | ORAL_TABLET | Freq: Every day | ORAL | 3 refills | Status: DC

## 2018-03-11 NOTE — Progress Notes (Signed)
HPI:   Wanda Thornton is a 57 y.o. female, who is here today for her routine physical.  Last CPE: 11/2015  Regular exercise 3 or more time per week: Not consistently due to work schedule. Following a healthy diet: Yes She lives with her husband.  Chronic medical problems: Vit D def,asthma,HTN,OSA,and allergic rhinitis among some.  Pap smear done last in 11/2015, negative. Hx of abnormal pap smears: Negative. Hx of STD's: not in years, was treated for STD in her early 20's.  M: 17 LMP hysterectomy 2005 because fibroid.   Immunization History  Administered Date(s) Administered  . Influenza,inj,Quad PF,6+ Mos 10/16/1998, 06/17/2017  . Pneumococcal Polysaccharide-23 11/07/2015  . Tdap 09/15/2009    Mammogram: 06/2017 Colonoscopy: 4 years ago, DEXA: N/A  Hep C screening: Not done before.  She has no concerns today.  HTN: She is on HCTZ 25 mg daily and Amlodipine 10 mg daily.  Asthma: On Albuterol inh and Advair 250-50 mcg bid. She is also on Singulair 10 mg daily. She does not need Albuterol inh often.  Tolerating medications, no side effects reported.  Review of Systems  Constitutional: Negative for appetite change, fatigue and fever.  HENT: Negative for hearing loss, mouth sores, trouble swallowing and voice change.   Eyes: Negative for redness and visual disturbance.  Respiratory: Negative for cough, shortness of breath and wheezing.   Cardiovascular: Negative for chest pain and leg swelling.  Gastrointestinal: Negative for abdominal pain, nausea and vomiting.       No changes in bowel habits.  Endocrine: Negative for cold intolerance, heat intolerance, polydipsia, polyphagia and polyuria.  Genitourinary: Negative for decreased urine volume, dysuria, hematuria, vaginal bleeding and vaginal discharge.  Musculoskeletal: Negative for gait problem and myalgias.  Skin: Negative for color change and rash.  Allergic/Immunologic: Positive for  environmental allergies.  Neurological: Negative for syncope, weakness and headaches.  Hematological: Negative for adenopathy. Does not bruise/bleed easily.  Psychiatric/Behavioral: Negative for confusion and sleep disturbance. The patient is not nervous/anxious.   All other systems reviewed and are negative.     Current Outpatient Medications on File Prior to Visit  Medication Sig Dispense Refill  . ADVAIR DISKUS 250-50 MCG/DOSE AEPB USE 1 INHALATION TWICE A DAY 180 each 1  . albuterol (PROVENTIL HFA;VENTOLIN HFA) 108 (90 Base) MCG/ACT inhaler Inhale 2 puffs into the lungs every 6 (six) hours as needed for wheezing or shortness of breath. 3 Inhaler 3  . amLODipine (NORVASC) 10 MG tablet Take 1 tablet (10 mg total) daily by mouth. 90 tablet 2  . CALCIUM CITRATE PO Take 2 tablets by mouth 2 (two) times daily.    . fexofenadine (ALLEGRA) 180 MG tablet Take 180 mg by mouth daily.    . fluticasone (FLONASE) 50 MCG/ACT nasal spray USE 2 SPRAYS IN EACH NOSTRIL DAILY 48 g 1  . hydrochlorothiazide (HYDRODIURIL) 25 MG tablet Take 1 tablet (25 mg total) daily by mouth. 90 tablet 2  . potassium chloride (K-DUR) 10 MEQ tablet Take 1 tablet (10 mEq total) by mouth 2 (two) times daily. 90 tablet 2   No current facility-administered medications on file prior to visit.      Past Medical History:  Diagnosis Date  . Allergy   . Hypertension     Past Surgical History:  Procedure Laterality Date  . BREAST BIOPSY      Not on File  Family History  Problem Relation Age of Onset  . Hyperlipidemia Mother   . Hypertension  Mother   . Hypertension Father   . Hyperlipidemia Father   . Diabetes Sister   . Cancer Sister        breast  . Breast cancer Sister   . Diabetes Sister   . Cancer Sister        breast  . Breast cancer Sister   . Breast cancer Maternal Aunt     Social History   Socioeconomic History  . Marital status: Married    Spouse name: Not on file  . Number of children: Not on  file  . Years of education: Not on file  . Highest education level: Not on file  Occupational History  . Not on file  Social Needs  . Financial resource strain: Not on file  . Food insecurity:    Worry: Not on file    Inability: Not on file  . Transportation needs:    Medical: Not on file    Non-medical: Not on file  Tobacco Use  . Smoking status: Never Smoker  . Smokeless tobacco: Never Used  Substance and Sexual Activity  . Alcohol use: No  . Drug use: No  . Sexual activity: Yes  Lifestyle  . Physical activity:    Days per week: Not on file    Minutes per session: Not on file  . Stress: Not on file  Relationships  . Social connections:    Talks on phone: Not on file    Gets together: Not on file    Attends religious service: Not on file    Active member of club or organization: Not on file    Attends meetings of clubs or organizations: Not on file    Relationship status: Not on file  Other Topics Concern  . Not on file  Social History Narrative  . Not on file     Vitals:   03/11/18 0834  BP: 120/82  Pulse: 95  Resp: 12  Temp: 98.3 F (36.8 C)  SpO2: 97%   Body mass index is 36.67 kg/m.   Wt Readings from Last 3 Encounters:  03/11/18 200 lb 8 oz (90.9 kg)  06/17/17 206 lb 3.2 oz (93.5 kg)  02/13/17 206 lb 12.8 oz (93.8 kg)     Physical Exam  Nursing note and vitals reviewed. Constitutional: She is oriented to person, place, and time. She appears well-developed. No distress.  HENT:  Head: Normocephalic and atraumatic.  Right Ear: Hearing, tympanic membrane, external ear and ear canal normal.  Left Ear: Hearing, tympanic membrane, external ear and ear canal normal.  Mouth/Throat: Uvula is midline, oropharynx is clear and moist and mucous membranes are normal.  Eyes: Pupils are equal, round, and reactive to light. Conjunctivae and EOM are normal.  Neck: No tracheal deviation present. No thyromegaly present.  Cardiovascular: Normal rate and regular  rhythm.  No murmur heard. Pulses:      Dorsalis pedis pulses are 2+ on the right side, and 2+ on the left side.  Respiratory: Effort normal and breath sounds normal. No respiratory distress.  GI: Soft. She exhibits no mass. There is no hepatomegaly. There is no tenderness.  Genitourinary:  Genitourinary Comments: Breast: No masses, nipple discharge,or skin abnormalities bilateral.  Musculoskeletal: She exhibits no edema.  No major deformity or signs of synovitis appreciated.  Lymphadenopathy:    She has no cervical adenopathy.       Right: No supraclavicular adenopathy present.       Left: No supraclavicular adenopathy present.  Neurological: She is  alert and oriented to person, place, and time. She has normal strength. No cranial nerve deficit. Coordination and gait normal.  Reflex Scores:      Bicep reflexes are 2+ on the right side and 2+ on the left side.      Patellar reflexes are 2+ on the right side and 2+ on the left side. Skin: Skin is warm. No rash noted. No erythema.  Psychiatric: She has a normal mood and affect. Her speech is normal.  Well groomed, good eye contact.      ASSESSMENT AND PLAN:  Ms. Shanyce Daris was here today annual physical examination.    Orders Placed This Encounter  Procedures  . Comprehensive metabolic panel  . Lipid panel  . Hepatitis C antibody screen  . TSH    Lab Results  Component Value Date   CHOL 170 03/11/2018   HDL 36.50 (L) 03/11/2018   LDLCALC 117 (H) 03/11/2018   TRIG 83.0 03/11/2018   CHOLHDL 5 03/11/2018   Lab Results  Component Value Date   TSH 1.45 03/11/2018   Lab Results  Component Value Date   ALT 15 03/11/2018   AST 16 03/11/2018   ALKPHOS 105 03/11/2018   BILITOT 1.0 03/11/2018   Lab Results  Component Value Date   CREATININE 0.71 03/11/2018   BUN 12 03/11/2018   NA 142 03/11/2018   K 3.4 (L) 03/11/2018   CL 103 03/11/2018   CO2 30 03/11/2018     Routine general medical examination at a  health care facility  We discussed the importance of regular physical activity and healthy diet for prevention of chronic illness and/or complications. Preventive guidelines reviewed. Vaccination up to date.  Ca++ and vit D supplementation recommended. Next CPE in a year.  The 10-year ASCVD risk score Mikey Bussing DC Brooke Bonito., et al., 2013) is: 4.8%   Values used to calculate the score:     Age: 19 years     Sex: Female     Is Non-Hispanic African American: Yes     Diabetic: No     Tobacco smoker: No     Systolic Blood Pressure: 378 mmHg     Is BP treated: Yes     HDL Cholesterol: 36.5 mg/dL     Total Cholesterol: 170 mg/dL  Encounter for HCV screening test for high risk patient -     Hepatitis C antibody screen  Screening for lipid disorders -     Lipid panel  Diabetes mellitus screening -     Comprehensive metabolic panel  Hypertension, essential, benign  Adequately controlled. No changes in current management. DASH-low salt diet to continue. Eye exam recommended annually. F/U in 12 months, before if needed.  -     TSH  Asthma, extrinsic, moderate persistent, uncomplicated  Well controlled. No changes in current management. F/U annually, before if needed.  -     montelukast (SINGULAIR) 10 MG tablet; Take 1 tablet (10 mg total) by mouth at bedtime.      Return in 1 year (on 03/12/2019) for cmp and f/u.      Betty G. Martinique, MD  [facility]. [place] office.

## 2018-03-11 NOTE — Patient Instructions (Addendum)
A few things to remember from today's visit:   Routine general medical examination at a health care facility  Encounter for HCV screening test for high risk patient - Plan: Hepatitis C antibody screen  Screening for lipid disorders - Plan: Lipid panel  Diabetes mellitus screening - Plan: Comprehensive metabolic panel  Hypertension, essential, benign - Plan: TSH  Today you have you routine preventive visit.  At least 150 minutes of moderate exercise per week, daily brisk walking for 15-30 min is a good exercise option. Healthy diet low in saturated (animal) fats and sweets and consisting of fresh fruits and vegetables, lean meats such as fish and white chicken and whole grains.  These are some of recommendations for screening depending of age and risk factors:   - Vaccines:  Tdap vaccine every 10 years.  Shingles vaccine recommended at age 71, could be given after 55 years of age but not sure about insurance coverage.   Pneumonia vaccines:  Prevnar 13 at 65 and Pneumovax at 28. Sometimes Pneumovax is giving earlier if history of smoking, lung disease,diabetes,kidney disease among some.    Screening for diabetes at age 62 and every 3 years.  Cervical cancer prevention:  Pap smear starts at 54 years of age and continues periodically until 55 years old in low risk women. Pap smear every 3 years between 30 and 23 years old. Pap smear every 3-5 years between women 11 and older if pap smear negative and HPV screening negative.   -Breast cancer: Mammogram: There is disagreement between experts about when to start screening in low risk asymptomatic female but recent recommendations are to start screening at 70 and not later than 55 years old , every 1-2 years and after 55 yo q 2 years. Screening is recommended until 55 years old but some women can continue screening depending of healthy issues.   Colon cancer screening: starts at 55 years old until 55 years old.  Cholesterol  disorder screening at age 56 and every 3 years.  Also recommended:  1. Dental visit- Brush and floss your teeth twice daily; visit your dentist twice a year. 2. Eye doctor- Get an eye exam at least every 2 years. 3. Helmet use- Always wear a helmet when riding a bicycle, motorcycle, rollerblading or skateboarding. 4. Safe sex- If you may be exposed to sexually transmitted infections, use a condom. 5. Seat belts- Seat belts can save your live; always wear one. 6. Smoke/Carbon Monoxide detectors- These detectors need to be installed on the appropriate level of your home. Replace batteries at least once a year. 7. Skin cancer- When out in the sun please cover up and use sunscreen 15 SPF or higher. 8. Violence- If anyone is threatening or hurting you, please tell your healthcare provider.  9. Drink alcohol in moderation- Limit alcohol intake to one drink or less per day. Never drink and drive.  Please be sure medication list is accurate. If a new problem present, please set up appointment sooner than planned today.

## 2018-03-12 LAB — HEPATITIS C ANTIBODY
Hepatitis C Ab: NONREACTIVE
SIGNAL TO CUT-OFF: 0.01 (ref <1.00)

## 2018-03-16 ENCOUNTER — Telehealth: Payer: Self-pay | Admitting: Family Medicine

## 2018-03-16 NOTE — Telephone Encounter (Signed)
Copied from [place] [id number]. Topic: Quick Communication - Lab Results >> Mar 16, 2018  1:27 PM Zacarias Pontes, CMA wrote: Called patient to inform them of their lab results. When patient returns call, triage nurse may disclose results. >> Mar 16, 2018  3:07 PM Wynetta Emery, [place] C wrote: Pt is returning call for results.     Pt is currently at a eye Dr apt, she would like a call back at around 4 or so if possible.

## 2018-03-17 NOTE — Telephone Encounter (Signed)
Patient informed per [facility] 03/16/18.

## 2018-03-27 ENCOUNTER — Telehealth: Payer: Self-pay | Admitting: Internal Medicine

## 2018-03-27 ENCOUNTER — Other Ambulatory Visit: Payer: Self-pay | Admitting: Internal Medicine

## 2018-03-27 DIAGNOSIS — G4733 Obstructive sleep apnea (adult) (pediatric): Secondary | ICD-10-CM

## 2018-03-27 DIAGNOSIS — Z9989 Dependence on other enabling machines and devices: Principal | ICD-10-CM

## 2018-03-27 NOTE — Telephone Encounter (Signed)
Patient requesting to change DME from [facility] to [place].  DME order placed.  Left detailed message on voicemail for Patient.

## 2018-04-01 ENCOUNTER — Ambulatory Visit (INDEPENDENT_AMBULATORY_CARE_PROVIDER_SITE_OTHER): Payer: 59 | Admitting: Family Medicine

## 2018-04-01 ENCOUNTER — Encounter: Payer: Self-pay | Admitting: Family Medicine

## 2018-04-01 VITALS — BP 124/82 | HR 95 | Temp 98.4°F | Resp 12 | Ht 62.0 in | Wt 204.2 lb

## 2018-04-01 DIAGNOSIS — M25512 Pain in left shoulder: Secondary | ICD-10-CM | POA: Diagnosis not present

## 2018-04-01 NOTE — Progress Notes (Signed)
ACUTE VISIT   HPI:  Chief Complaint  Patient presents with  . Left shoulder pain    started Monday    Wanda Thornton is a 57 y.o. female, who is here today complaining of 3 days of intermittent left shoulder pain. Sudden onset of "excruciating" pain on anterior aspect of left shoulder and limitation of movement.  In the past she has had left shoulder pain but a it is usually milder. Pain is exacerbated by movement and alleviated by rest. Pain interferes with his sleep, aggravated when she sleeps on left side. Her husband has been helping her to dress because of limitation of movement due to pain.  She states that pain has improved after she felt like something "popped in place", it caused excruciating pain for a few seconds. Pain was 10/10 now it is 3/10.  The day before problem started she remembers "bumping" her shoulder against furniture when she was turning.  She did not have any problem at that time. Yesterday she noted mild tingling sensation on fingers left hand, resolved. She has not noted weakness, joint edema or erythema, deformity, or rash. Negative for cough, wheezing, dyspnea, or chest pain.   She took Motrin last night and has applied local ice. Today she took Tylenol.  Review of Systems  Constitutional: Negative for chills and fever.  Respiratory: Negative for cough, shortness of breath and wheezing.   Cardiovascular: Negative for chest pain, palpitations and leg swelling.  Musculoskeletal: Positive for arthralgias. Negative for joint swelling.  Skin: Negative for rash and wound.  Neurological: Negative for weakness and numbness.  Psychiatric/Behavioral: Positive for sleep disturbance. Negative for confusion.      Current Outpatient Medications on File Prior to Visit  Medication Sig Dispense Refill  . ADVAIR DISKUS 250-50 MCG/DOSE AEPB USE 1 INHALATION TWICE A DAY 180 each 1  . albuterol (PROVENTIL HFA;VENTOLIN HFA) 108 (90 Base) MCG/ACT  inhaler Inhale 2 puffs into the lungs every 6 (six) hours as needed for wheezing or shortness of breath. 3 Inhaler 3  . amLODipine (NORVASC) 10 MG tablet Take 1 tablet (10 mg total) daily by mouth. 90 tablet 2  . CALCIUM CITRATE PO Take 2 tablets by mouth 2 (two) times daily.    . fexofenadine (ALLEGRA) 180 MG tablet Take 180 mg by mouth daily.    . fluticasone (FLONASE) 50 MCG/ACT nasal spray USE 2 SPRAYS IN EACH NOSTRIL DAILY 48 g 1  . hydrochlorothiazide (HYDRODIURIL) 25 MG tablet Take 1 tablet (25 mg total) daily by mouth. 90 tablet 2  . montelukast (SINGULAIR) 10 MG tablet Take 1 tablet (10 mg total) by mouth at bedtime. 90 tablet 3  . potassium chloride (K-DUR) 10 MEQ tablet Take 1 tablet (10 mEq total) by mouth 2 (two) times daily. 90 tablet 2   No current facility-administered medications on file prior to visit.      Past Medical History:  Diagnosis Date  . Allergy   . Hypertension    Not on File  Social History   Socioeconomic History  . Marital status: Married    Spouse name: Not on file  . Number of children: Not on file  . Years of education: Not on file  . Highest education level: Not on file  Occupational History  . Not on file  Social Needs  . Financial resource strain: Not on file  . Food insecurity:    Worry: Not on file    Inability: Not on file  .  Transportation needs:    Medical: Not on file    Non-medical: Not on file  Tobacco Use  . Smoking status: Never Smoker  . Smokeless tobacco: Never Used  Substance and Sexual Activity  . Alcohol use: No  . Drug use: No  . Sexual activity: Yes  Lifestyle  . Physical activity:    Days per week: Not on file    Minutes per session: Not on file  . Stress: Not on file  Relationships  . Social connections:    Talks on phone: Not on file    Gets together: Not on file    Attends religious service: Not on file    Active member of club or organization: Not on file    Attends meetings of clubs or organizations:  Not on file    Relationship status: Not on file  Other Topics Concern  . Not on file  Social History Narrative  . Not on file    Vitals:   04/01/18 0938  BP: 124/82  Pulse: 95  Resp: 12  Temp: 98.4 F (36.9 C)  SpO2: 98%   Body mass index is 37.36 kg/m.   Physical Exam  Nursing note and vitals reviewed. Constitutional: She is oriented to person, place, and time. She appears well-developed. She does not appear ill. No distress.  HENT:  Head: Normocephalic and atraumatic.  Eyes: Conjunctivae are normal.  Cardiovascular: Normal rate and regular rhythm.  Pulses:      Radial pulses are 2+ on the left side.  Respiratory: Effort normal and breath sounds normal. No respiratory distress.  GI: Soft. She exhibits no mass. There is no tenderness.  Musculoskeletal: She exhibits no edema.  Left shoulder: No deformity, edema, or erythema appreciated.No muscle atrophy. Luan Pulling' test neg, drop arm rotator cuff test neg, empty can supraspinatus test neg, cross body adduction test neg, lift-Off Subscapularis test neg. ROM limited, abduction mainly. Pain elicited with external rotation,palpation of anterior aspect, abduction, and  + impingement.  Lymphadenopathy:    She has no cervical adenopathy.       Left: No supraclavicular adenopathy present.  Neurological: She is alert and oriented to person, place, and time. She has normal strength. Gait normal.  Skin: Skin is warm. No rash noted. No erythema.  Psychiatric: She has a normal mood and affect.  Well groomed, good eye contact.     ASSESSMENT AND PLAN:   Wanda Thornton was seen today for left shoulder pain.  Diagnoses and all orders for this visit:  Acute pain of left shoulder -     Ambulatory referral to Physical Therapy    We discussed possible etiologies, most rotator cuff maneuvers negative, so I do not think she would benefit from subacromial steroid injection at this time.    ?Mild rotator cuff tendinitis,long head biceps  tendinitis, OA among some to consider. I do not think imaging is needed today. Recommend continuing OTC ibuprofen 400 to 600 mg 3 times daily with food for up to 7 days.  We discussed some side effects of NSAIDs, recommend checking BP at home. Continue localized. Topical OTC IcyHot or Aspercreme with lidocaine may help. PT referral was placed but in the meantime recommend range of motion exercises.    Return if symptoms worsen or fail to improve.     Gumaro Brightbill G. Martinique, MD  [facility]. [place] office.

## 2018-04-01 NOTE — Patient Instructions (Addendum)
A few things to remember from today's visit:   Acute pain of left shoulder - Plan: Ambulatory referral to Physical Therapy  While physical therapy is being arranged range of motion exercises recommended, IcyHot or Aspercreme with lidocaine, local ice. Ibuprofen over-the-counter 400 to 600 mg 3 times per day with food, you need to change your blood pressure because this medication can increase blood pressure.  Please be sure medication list is accurate. If a new problem present, please set up appointment sooner than planned today.

## 2018-04-16 ENCOUNTER — Encounter: Payer: Self-pay | Admitting: Physical Therapy

## 2018-04-16 ENCOUNTER — Ambulatory Visit: Payer: 59 | Attending: Family Medicine | Admitting: Physical Therapy

## 2018-04-16 ENCOUNTER — Other Ambulatory Visit: Payer: Self-pay

## 2018-04-16 DIAGNOSIS — M25612 Stiffness of left shoulder, not elsewhere classified: Secondary | ICD-10-CM | POA: Insufficient documentation

## 2018-04-16 DIAGNOSIS — M25512 Pain in left shoulder: Secondary | ICD-10-CM | POA: Insufficient documentation

## 2018-04-16 DIAGNOSIS — M6281 Muscle weakness (generalized): Secondary | ICD-10-CM | POA: Diagnosis present

## 2018-04-16 NOTE — Patient Instructions (Addendum)
   Wanda Thornton would benefit from a standing-option desk to be used for standing at least 3-5 minutes every hour during the work day.          [name] PT [facility] [address] [place], [place] [address] Phone # [phone] Fax [phone]

## 2018-04-16 NOTE — Therapy (Signed)
[facility] [address], [place], [place], [address] Phone: [phone]   Fax:  [phone]  Physical Therapy Evaluation  Patient Details  Name: Wanda Thornton MRN: [id number] Date of Birth: 12/15/1960 Referring Provider: Dr. Betty Martinique    Encounter Date: 04/16/2018  PT End of Session - 04/16/18 1603    Visit Number  1    Date for PT Re-Evaluation  06/11/18    PT Start Time  0845    PT Stop Time  0930    PT Time Calculation (min)  45 min    Activity Tolerance  Patient tolerated treatment well       Past Medical History:  Diagnosis Date  . Allergy   . Hypertension     Past Surgical History:  Procedure Laterality Date  . BREAST BIOPSY      There were no vitals filed for this visit.   Subjective Assessment - 04/16/18 0849    Subjective  Left anterior shoulder pain since July 1st.  Excruciating pain and unable to elevate to 90 degrees.  Had to have her husband drive her because of pain and limited motion.  Reaching for phone bothered.   Left  hand dominant.  Left side sleeper which she feels aggravates her.  Now primarily sore and ROM has improved since on vacation last week.      Pertinent History  Right hand trigger finger surgery 4 fingers,  Left hand bracing no surgery;  Ergo set up at work; sleeps on wedge CPAP    Limitations  House hold activities    Diagnostic tests  no x-rays    Patient Stated Goals  get my motion back without pain;  what to do to prevent it;  exercises to do;  what is causing it?    Currently in Pain?  Yes    Pain Score  3     Pain Location  Shoulder    Pain Orientation  Left;Anterior    Pain Type  Acute pain    Pain Onset  1 to 4 weeks ago    Pain Frequency  Intermittent    Aggravating Factors   Left side sleeping;  sitting with shoulders hiked at work, reaching out to close car door, internally rotation     Pain Relieving Factors  ice; hot shower         [facility] PT  Assessment - 04/16/18 0001      Assessment   Medical Diagnosis  left shoulder pain     Referring Provider  Dr. Betty Martinique     Onset Date/Surgical Date  03/30/18    Hand Dominance  Left    Next MD Visit  as needed    Prior Therapy  for hands      Precautions   Precautions  None      Restrictions   Weight Bearing Restrictions  No      Balance Screen   Has the patient fallen in the past 6 months  No    Has the patient had a decrease in activity level because of a fear of falling?   No    Is the patient reluctant to leave their home because of a fear of falling?   No      Home Film/video editor residence    Living Arrangements  Spouse/significant other      Prior Function   Level of Gray  Full time employment    Pharmacologist work    Leisure  read, on tablet, walk, go to movies      Observation/Other Assessments   Focus on Therapeutic Outcomes (FOTO)   50% limitation       Posture/Postural Control   Posture/Postural Control  Postural limitations    Postural Limitations  Rounded Shoulders;Forward head      AROM   Right Shoulder Flexion  146 Degrees    Right Shoulder ABduction  160 Degrees    Right Shoulder Internal Rotation  -- T4    Right Shoulder External Rotation  76 Degrees    Left Shoulder Flexion  135 Degrees    Left Shoulder ABduction  134 Degrees    Left Shoulder Internal Rotation  -- L5    Left Shoulder External Rotation  83 Degrees      Strength   Right Shoulder Flexion  4+/5    Right Shoulder Extension  4+/5    Right Shoulder ABduction  4+/5    Right Shoulder Internal Rotation  4+/5    Right Shoulder External Rotation  4+/5    Left Shoulder Flexion  3+/5    Left Shoulder Extension  4/5    Left Shoulder ABduction  3-/5    Left Shoulder Internal Rotation  4-/5    Left Shoulder External Rotation  4-/5      Palpation   Palpation comment  bicipital groove tenderness       Hawkins-Kennedy test   Findings  Positive    Side  Left      Belly Press   Findings  Negative      Empty Can test   Findings  Positive    Side  Left      Lag time at 0 degrees   Findings  Positive    Side  Left      Drop Arm test   Findings  Positive    Side  Left    Comment  pain but good control                 Objective measurements completed on examination: See above findings.              PT Education - 04/16/18 0926    Education Details   Access Code: [email or web address] scapular retractions, postural correction sitting;  upper trap stretch     Person(s) Educated  Patient    Methods  Explanation;Demonstration;Handout    Comprehension  Verbalized understanding;Returned demonstration       PT Short Term Goals - 04/16/18 1805      PT SHORT TERM GOAL #1   Title  The patient will demonstrate basic concepts of postural correction to correct shoulder alignment and initial HEP    Time  4    Period  Weeks    Status  New    Target Date  05/14/18      PT SHORT TERM GOAL #2   Title  The patient will report a 30% reduction in left shoulder pain with work activities and driving    Time  4    Period  Weeks    Status  New      PT SHORT TERM GOAL #3   Title  The patient will have improved shoulder flexion and abduction to 145 degrees needed for reaching the car door    Time  4    Period  Days    Status  New  PT SHORT TERM GOAL #4   Title  The patient will have improved internal rotation ROM to 50 degrees and decreased pain with horizontal adduction needed for turning the steering wheel    Time  4    Period  Weeks    Status  New        PT Long Term Goals - 04/16/18 1811      PT LONG TERM GOAL #1   Title  The patient will be independent with safe self progression of HEP    Time  8    Period  Weeks    Status  New    Target Date  06/11/18      PT LONG TERM GOAL #2   Title  The patient will report a 60% improvement in left shoulder pain with  dressing, driving, reaching and work ADLS    Time  8    Period  Weeks    Status  New      PT LONG TERM GOAL #3   Title  Left shoulder elevation to 150 degrees needed for reaching    Time  8    Period  Weeks    Status  New      PT LONG TERM GOAL #4   Title  Left glenoumeral and scapular strength to grossly 4/5 to 4+/5 needed for lifting/carrying light to medium objects    Time  8    Period  Weeks    Status  New      PT LONG TERM GOAL #5   Title  FOTO functional outcome score improved from 50% limitation to 33% indicating improved function with less pain    Time  8    Period  Weeks    Status  New             Plan - 04/16/18 0926    Clinical Impression Statement  The patient reports the onset of left anterior shoulder pain (dominant arm) on July 1st for no clear reason.  She reports the pain was severe and her ROM was limited to to 90 degrees elevation.  She reports sidelying, reaching for things on her desk at work, turning the steering wheel and pulling the car door shut continue to be painful.  Her left shoulder ROM is significantly limited with flexion, abduction and internal rotation.  Decreased strength as well particularly with flexion and abduction.  Decreased scapular strength contributing to glenohumeral muscle overuse.  +Painful arc, active compression test, pain with drop arm lowering.  Poor sitting posture affects glenohumeral alignment.  Tenderness anterior shoulder.  She would benefit from PT to address these deficits.      History and Personal Factors relevant to plan of care:  good home support; minimal co-morbidities    Clinical Presentation  Stable    Clinical Decision Making  Low    Rehab Potential  Good    PT Frequency  2x / week    PT Duration  8 weeks    PT Treatment/Interventions  ADLs/Self Care Home Management;Iontophoresis 4mg /ml Dexamethasone;Cryotherapy;Electrical Stimulation;Ultrasound;Moist Heat;Therapeutic activities;Therapeutic  exercise;Patient/family education;Neuromuscular re-education;Manual techniques;Dry needling;Taping    PT Next Visit Plan  ionto if cert signed; scapular strengthening;  initiate shoulder ROM ex and add to HEP;  KT tape    PT Home Exercise Plan   Access Code: [email or web address]     Recommended Other Services  stand up desk for work     [employer] and Agree with Plan of Care  Patient  Patient will benefit from skilled therapeutic intervention in order to improve the following deficits and impairments:  Pain, Postural dysfunction, Decreased activity tolerance, Decreased range of motion, Decreased strength, Impaired UE functional use  Visit Diagnosis: Acute pain of left shoulder - Plan: PT plan of care cert/re-cert  Stiffness of left shoulder, not elsewhere classified - Plan: PT plan of care cert/re-cert  Muscle weakness (generalized) - Plan: PT plan of care cert/re-cert     Problem List Patient Active Problem List   Diagnosis Date Noted  . OSA on CPAP 11/12/2016  . Vitamin D deficiency 11/12/2016  . Asthma, extrinsic, moderate persistent, uncomplicated 57/97/2820  . Seasonal and perennial allergic rhinitis 11/12/2016  . Hypertension, essential, benign 11/12/2016  . BMI 36.0-36.9,adult 11/12/2016   [name], PT 04/16/18 6:18 PM Phone: [phone] Fax: [phone]  [name] 04/16/2018, 6:17 PM  [facility] [address] [place], [place], [address] Phone: [phone]   Fax:  [phone]  Name: Wanda Thornton MRN: [id number] Date of Birth: [birth year]

## 2018-04-19 ENCOUNTER — Other Ambulatory Visit: Payer: Self-pay | Admitting: Family Medicine

## 2018-04-19 ENCOUNTER — Ambulatory Visit (HOSPITAL_COMMUNITY)
Admission: EM | Admit: 2018-04-19 | Discharge: 2018-04-19 | Disposition: A | Discharge status: Home / Self Care | Payer: 59 | Attending: Family Medicine | Admitting: Family Medicine

## 2018-04-19 ENCOUNTER — Encounter (HOSPITAL_COMMUNITY): Payer: Self-pay

## 2018-04-19 DIAGNOSIS — T887XXA Unspecified adverse effect of drug or medicament, initial encounter: Secondary | ICD-10-CM

## 2018-04-19 DIAGNOSIS — L500 Allergic urticaria: Secondary | ICD-10-CM

## 2018-04-19 DIAGNOSIS — L509 Urticaria, unspecified: Secondary | ICD-10-CM

## 2018-04-19 DIAGNOSIS — T7840XA Allergy, unspecified, initial encounter: Secondary | ICD-10-CM

## 2018-04-19 MED ORDER — HYDROXYZINE HCL 25 MG PO TABS: 25.0000 mg | ORAL_TABLET | Freq: Four times a day (QID) | ORAL | 0 refills | Status: DC | PRN

## 2018-04-19 MED ORDER — METHYLPREDNISOLONE ACETATE 80 MG/ML IJ SUSP
80.0000 mg | Freq: Once | INTRAMUSCULAR | Status: AC
  Administered 2018-04-19: 80 mg via INTRAMUSCULAR

## 2018-04-19 MED ORDER — METHYLPREDNISOLONE ACETATE 80 MG/ML IJ SUSP
INTRAMUSCULAR | Status: AC
  Filled 2018-04-19: qty 1

## 2018-04-19 NOTE — ED Triage Notes (Signed)
Pt presents with rash over entire body. Reports new exfoliant at home. Friend recently also had a rash and was seen here. Pt is also on Amoxicillin for a tooth problem.

## 2018-04-19 NOTE — Discharge Instructions (Addendum)
Take the hydroxyzine for itching Update your allergy list to include amoxicillin Return if you are worse or have more swelling/trouble breathing

## 2018-04-19 NOTE — ED Provider Notes (Signed)
[facility]    CSN: [id number] Arrival date & time: 04/19/18  1556     History   Chief Complaint Chief Complaint  Patient presents with  . Rash    HPI Wanda Thornton is a 57 y.o. female.   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 Allergy   . Hypertension     Patient Active Problem List   Diagnosis Date Noted  . OSA on CPAP 11/12/2016  . Vitamin D deficiency 11/12/2016  . Asthma, extrinsic, moderate persistent, uncomplicated 62/37/6283  . Seasonal and perennial allergic rhinitis 11/12/2016  . Hypertension, essential, benign 11/12/2016  . BMI 36.0-36.9,adult 11/12/2016    Past Surgical History:  Procedure Laterality Date  . BREAST BIOPSY      OB History   None      Home Medications    Prior to Admission medications   Medication Sig Start Date End Date Taking? Authorizing Provider  ADVAIR DISKUS 250-50 MCG/DOSE AEPB USE 1 INHALATION TWICE A DAY 01/26/18   Martinique, Betty G, MD  albuterol (PROVENTIL HFA;VENTOLIN HFA) 108 (90 Base) MCG/ACT inhaler Inhale 2 puffs into the lungs every 6 (six) hours as needed for wheezing or shortness of breath. 06/17/17   Baird Lyons D, MD  amLODipine (NORVASC) 10 MG tablet Take 1 tablet (10 mg total) daily by mouth. 08/15/17   Martinique, Betty G, MD  CALCIUM CITRATE PO Take 2 tablets by mouth 2 (two) times daily.    [provider]  fexofenadine (ALLEGRA) 180 MG tablet Take 180 mg by mouth daily.    [provider]  fluticasone (FLONASE) 50 MCG/ACT nasal spray USE 2 SPRAYS IN EACH NOSTRIL DAILY 08/04/17   Martinique, Betty G, MD  hydrochlorothiazide (HYDRODIURIL) 25 MG tablet Take 1 tablet (25 mg total) daily by mouth. 08/15/17   Martinique, Betty G, MD  hydrOXYzine (ATARAX/VISTARIL) 25 MG tablet Take 1-2 tablets  (25-50 mg total) by mouth every 6 (six) hours as needed for itching. 04/19/18   Raylene Everts, MD  montelukast (SINGULAIR) 10 MG tablet Take 1 tablet (10 mg total) by mouth at bedtime. 03/11/18   Martinique, Betty G, MD  potassium chloride (K-DUR) 10 MEQ tablet Take 1 tablet (10 mEq total) by mouth 2 (two) times daily. 08/26/17   Martinique, Betty G, MD    Family History Family History  Problem Relation Age of Onset  . Hyperlipidemia Mother   . Hypertension Mother   . Hypertension Father   . Hyperlipidemia Father   . Diabetes Sister   . Cancer Sister        breast  . Breast cancer Sister   . Diabetes Sister   . Cancer Sister        breast  . Breast cancer Sister   . Breast cancer Maternal Aunt     Social History Social History   Tobacco Use  . Smoking status: Never Smoker  . Smokeless tobacco: Never Used  Substance Use Topics  . Alcohol use: No  . Drug use: No     Allergies   Amoxicillin; Bactrim [sulfamethoxazole-trimethoprim]; Clindamycin/lincomycin; Hydrocodone; Keflex [cephalexin]; and Lisinopril   Review of Systems Review of Systems  Constitutional: Negative for chills and fever.  HENT: Negative for ear pain and sore throat.   Eyes: Negative for pain  and visual disturbance.  Respiratory: Negative for cough and shortness of breath.   Cardiovascular: Negative for chest pain and palpitations.  Gastrointestinal: Negative for abdominal pain and vomiting.  Genitourinary: Negative for dysuria and hematuria.  Musculoskeletal: Negative for arthralgias and back pain.  Skin: Positive for rash. Negative for color change.  Neurological: Negative for seizures and syncope.  All other systems reviewed and are negative.    Physical Exam Triage Vital Signs ED Triage Vitals  Enc Vitals Group     BP 04/19/18 1625 (!) 152/88     Pulse Rate 04/19/18 1625 93     Resp 04/19/18 1625 18     Temp 04/19/18 1625 98.5 F (36.9 C)     Temp src --      SpO2 04/19/18 1625 99 %      Weight --      Height --      Head Circumference --      Peak Flow --      Pain Score 04/19/18 1626 0     Pain Loc --      Pain Edu? --      Excl. in [place]? --    No data found.  Updated Vital Signs BP (!) 152/88   Pulse 93   Temp 98.5 F (36.9 C)   Resp 18   SpO2 99%      Physical Exam  Constitutional: She appears well-developed and well-nourished. No distress.  HENT:  Head: Normocephalic and atraumatic.  Right Ear: External ear normal.  Left Ear: External ear normal.  Mouth/Throat: Oropharynx is clear and moist.  Oropharynx clear  Eyes: Pupils are equal, round, and reactive to light. Conjunctivae are normal.  Neck: Normal range of motion.  Cardiovascular: Normal rate, regular rhythm and normal heart sounds.  Pulmonary/Chest: Effort normal. No respiratory distress.  Lungs are clear  Abdominal: Soft. She exhibits no distension.  Musculoskeletal: Normal range of motion. She exhibits no edema.  Lymphadenopathy:    She has no cervical adenopathy.  Neurological: She is alert.  Skin: Skin is warm and dry.  Legs, trunk, arms, and few on neck-covered and scattered urticarial wheals that measure 2 to 3 cm across  Psychiatric: She has a normal mood and affect. Her behavior is normal.     UC Treatments / Results  Labs (all labs ordered are listed, but only abnormal results are displayed) Labs Reviewed - No data to display  EKG None  Radiology No results found.  Procedures Procedures (including critical care time)  Medications Ordered in UC Medications  methylPREDNISolone acetate (DEPO-MEDROL) injection 80 mg (80 mg Intramuscular Given 04/19/18 1732)    Initial Impression / Assessment and Plan / UC Course  I have reviewed the triage vital signs and the nursing notes.  Pertinent labs & imaging results that were available during my care of the patient were reviewed by me and considered in my medical decision making (see chart for details).     Discussed likely  penicillin allergy.  Discussed that the earlier courses of amoxicillin she may have had likely sensitizer to this eventual reaction.  It is possible she is on amoxicillin and she developed urticaria for another reason, but the most likely reason is a drug allergy.  If needed, she can have allergy skin testing in the future. Final Clinical Impressions(s) / UC Diagnoses   Final diagnoses:  Urticaria  Allergic reaction, initial encounter     Discharge Instructions     Take the hydroxyzine for itching  Update your allergy list to include amoxicillin Return if you are worse or have more swelling/trouble breathing    ED Prescriptions    Medication Sig Dispense Auth. Provider   hydrOXYzine (ATARAX/VISTARIL) 25 MG tablet Take 1-2 tablets (25-50 mg total) by mouth every 6 (six) hours as needed for itching. 20 tablet Raylene Everts, MD     Controlled Substance Prescriptions [place] Controlled Substance Registry consulted? Not Applicable   Raylene Everts, MD 04/19/18 2018

## 2018-04-20 ENCOUNTER — Other Ambulatory Visit: Payer: Self-pay

## 2018-04-20 ENCOUNTER — Encounter (HOSPITAL_COMMUNITY): Payer: Self-pay | Admitting: Emergency Medicine

## 2018-04-20 ENCOUNTER — Emergency Department (HOSPITAL_COMMUNITY)
Admission: EM | Admit: 2018-04-20 | Discharge: 2018-04-20 | Disposition: A | Discharge status: Home / Self Care | Payer: 59 | Attending: Emergency Medicine | Admitting: Emergency Medicine

## 2018-04-20 ENCOUNTER — Ambulatory Visit: Payer: 59 | Admitting: Physical Therapy

## 2018-04-20 DIAGNOSIS — Z79899 Other long term (current) drug therapy: Secondary | ICD-10-CM | POA: Diagnosis not present

## 2018-04-20 DIAGNOSIS — Z880 Allergy status to penicillin: Secondary | ICD-10-CM | POA: Diagnosis not present

## 2018-04-20 DIAGNOSIS — Z885 Allergy status to narcotic agent status: Secondary | ICD-10-CM | POA: Insufficient documentation

## 2018-04-20 DIAGNOSIS — I10 Essential (primary) hypertension: Secondary | ICD-10-CM | POA: Insufficient documentation

## 2018-04-20 DIAGNOSIS — R21 Rash and other nonspecific skin eruption: Secondary | ICD-10-CM | POA: Diagnosis present

## 2018-04-20 DIAGNOSIS — T7840XA Allergy, unspecified, initial encounter: Secondary | ICD-10-CM | POA: Insufficient documentation

## 2018-04-20 MED ORDER — METHYLPREDNISOLONE SODIUM SUCC 125 MG IJ SOLR
80.0000 mg | Freq: Once | INTRAMUSCULAR | Status: AC
  Administered 2018-04-20: 80 mg via INTRAVENOUS
  Filled 2018-04-20: qty 2

## 2018-04-20 MED ORDER — SODIUM CHLORIDE 0.9 % IV BOLUS
500.0000 mL | Freq: Once | INTRAVENOUS | Status: AC
  Administered 2018-04-20: 500 mL via INTRAVENOUS

## 2018-04-20 MED ORDER — DIPHENHYDRAMINE HCL 50 MG/ML IJ SOLN
25.0000 mg | Freq: Once | INTRAMUSCULAR | Status: AC
  Administered 2018-04-20: 25 mg via INTRAVENOUS
  Filled 2018-04-20: qty 1

## 2018-04-20 MED ORDER — EPINEPHRINE 0.3 MG/0.3ML IJ SOAJ: 0.3000 mg | Freq: Once | INTRAMUSCULAR | 1 refills | Status: AC

## 2018-04-20 MED ORDER — PREDNISONE 10 MG (21) PO TBPK: ORAL_TABLET | ORAL | 0 refills | Status: DC

## 2018-04-20 MED ORDER — FAMOTIDINE 20 MG PO TABS: 20.0000 mg | ORAL_TABLET | Freq: Two times a day (BID) | ORAL | 0 refills | Status: DC

## 2018-04-20 MED ORDER — DIPHENHYDRAMINE HCL 25 MG PO TABS: 25.0000 mg | ORAL_TABLET | Freq: Four times a day (QID) | ORAL | 0 refills | Status: DC

## 2018-04-20 MED ORDER — FAMOTIDINE IN NACL 20-0.9 MG/50ML-% IV SOLN
20.0000 mg | Freq: Once | INTRAVENOUS | Status: AC
  Administered 2018-04-20: 20 mg via INTRAVENOUS
  Filled 2018-04-20: qty 50

## 2018-04-20 NOTE — ED Notes (Signed)
Patient verbalizes understanding of discharge instructions. Opportunity for questioning and answers were provided. Armband removed by staff, pt discharged from ED ambulatory.   

## 2018-04-20 NOTE — ED Notes (Signed)
ED Provider at bedside. 

## 2018-04-20 NOTE — ED Provider Notes (Signed)
[facility] EMERGENCY DEPARTMENT Provider Note   CSN: [id number] Arrival date & time: 04/20/18  0354     History   Chief Complaint Chief Complaint  Patient presents with  . Allergic Reaction    HPI Wanda Thornton is a 57 y.o. female.  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  Patient states that she was started on amoxicillin approximately 10 days ago for a dental infection.  States that she was supposed to take the last dose of her medication yesterday.  Patient went to urgent care for rash to her entire body.  She was given steroid injection in the office.  Patient states that she went home this evening and the rash significantly worsened.  She felt like her ears were swelling.  Patient at one time felt that her throat was closing however this completely resolved.  Patient did take a hydroxyzine at approximately 2:00 this morning.  Patient reports intense pruritus with worsening rash.  Denies any difficulties breathing or swallowing at this time.  Denies any associated wheezing.  Patient denies any nausea or vomiting.  Denies shortness of breath.  Denies any other associated symptoms.  The reason the patient came to the ED was worsening rash. Past Medical History:  Diagnosis Date  . Allergy   . Hypertension     Patient Active Problem List   Diagnosis Date Noted  . OSA on CPAP 11/12/2016  . Vitamin D deficiency 11/12/2016  . Asthma, extrinsic, moderate persistent, uncomplicated 84/13/2440  . Seasonal and perennial allergic rhinitis 11/12/2016  . Hypertension, essential, benign 11/12/2016  . BMI 36.0-36.9,adult 11/12/2016    Past Surgical History:  Procedure Laterality Date  . BREAST BIOPSY       OB History   None      Home Medications    Prior to Admission medications   Medication Sig Start Date End Date Taking? Authorizing Provider  ADVAIR DISKUS  250-50 MCG/DOSE AEPB USE 1 INHALATION TWICE A DAY 01/26/18   Martinique, Betty G, MD  albuterol (PROVENTIL HFA;VENTOLIN HFA) 108 (90 Base) MCG/ACT inhaler Inhale 2 puffs into the lungs every 6 (six) hours as needed for wheezing or shortness of breath. 06/17/17   Baird Lyons D, MD  amLODipine (NORVASC) 10 MG tablet Take 1 tablet (10 mg total) daily by mouth. 08/15/17   Martinique, Betty G, MD  CALCIUM CITRATE PO Take 2 tablets by mouth 2 (two) times daily.    [provider]  fexofenadine (ALLEGRA) 180 MG tablet Take 180 mg by mouth daily.    [provider]  fluticasone (FLONASE) 50 MCG/ACT nasal spray USE 2 SPRAYS IN EACH NOSTRIL DAILY 08/04/17   Martinique, Betty G, MD  hydrochlorothiazide (HYDRODIURIL) 25 MG tablet Take 1 tablet (25 mg total) daily by mouth. 08/15/17   Martinique, Betty G, MD  hydrOXYzine (ATARAX/VISTARIL) 25 MG tablet Take 1-2 tablets (25-50 mg total) by mouth every 6 (six) hours as needed for itching. 04/19/18   Raylene Everts, MD  montelukast (SINGULAIR) 10 MG tablet Take 1 tablet (10 mg total) by mouth at bedtime. 03/11/18   Martinique, Betty G, MD  potassium chloride (K-DUR) 10 MEQ tablet Take 1 tablet (10 mEq total) by mouth 2 (two) times daily. 08/26/17   Martinique, Betty G, MD    Family History Family History  Problem Relation Age of Onset  . Hyperlipidemia Mother   . Hypertension Mother   . Hypertension Father   .  Hyperlipidemia Father   . Diabetes Sister   . Cancer Sister        breast  . Breast cancer Sister   . Diabetes Sister   . Cancer Sister        breast  . Breast cancer Sister   . Breast cancer Maternal Aunt     Social History Social History   Tobacco Use  . Smoking status: Never Smoker  . Smokeless tobacco: Never Used  Substance Use Topics  . Alcohol use: No  . Drug use: No     Allergies   Amoxicillin; Bactrim [sulfamethoxazole-trimethoprim]; Clindamycin/lincomycin; Hydrocodone; Keflex [cephalexin]; and Lisinopril   Review of  Systems Review of Systems  Constitutional: Negative for chills and fever.  HENT: Negative for congestion and trouble swallowing.   Respiratory: Negative for shortness of breath and wheezing.   Gastrointestinal: Negative for abdominal pain, nausea and vomiting.  Skin: Positive for rash.  Neurological: Negative for dizziness, light-headedness and headaches.     Physical Exam Updated Vital Signs BP (!) 152/95 (BP Location: Right Arm)   Pulse (!) 106   Temp 98.3 F (36.8 C) (Oral)   Resp 18   SpO2 100%   Physical Exam  Constitutional: She is oriented to person, place, and time. She appears well-developed and well-nourished.  Non-toxic appearance. No distress.  HENT:  Head: Normocephalic and atraumatic.  Nose: Nose normal.  Mouth/Throat: Oropharynx is clear and moist.  Oropharynx is clear.  No angioedema of tongue or lips.  Managing secretions tolerating airway.  Speaking complete sentences.  No mucosal involvement.  Eyes: Pupils are equal, round, and reactive to light. Conjunctivae are normal. Right eye exhibits no discharge. Left eye exhibits no discharge.  Neck: Normal range of motion. Neck supple.  Cardiovascular: Normal rate, regular rhythm, normal heart sounds and intact distal pulses. Exam reveals no gallop and no friction rub.  No murmur heard. Pulmonary/Chest: Effort normal and breath sounds normal. No stridor. No respiratory distress. She has no wheezes. She has no rales. She exhibits no tenderness.  Abdominal: Soft. Bowel sounds are normal. There is no tenderness. There is no rebound and no guarding.  Musculoskeletal: Normal range of motion. She exhibits no tenderness.  Lymphadenopathy:    She has no cervical adenopathy.  Neurological: She is alert and oriented to person, place, and time.  Skin: Skin is warm and dry. Capillary refill takes less than 2 seconds. There is erythema.  Patient has urticarial-like rash to entire body worse in arms, legs, anterior chest and face.   Intense pruritus noted.  Psychiatric: Her behavior is normal. Judgment and thought content normal.  Nursing note and vitals reviewed.    ED Treatments / Results  Labs (all labs ordered are listed, but only abnormal results are displayed) Labs Reviewed - No data to display  EKG None  Radiology No results found.  Procedures Procedures (including critical care time)  Medications Ordered in ED Medications  famotidine (PEPCID) IVPB 20 mg premix (has no administration in time range)  sodium chloride 0.9 % bolus 500 mL (500 mLs Intravenous New Bag/Given 04/20/18 0533)  methylPREDNISolone sodium succinate (SOLU-MEDROL) 125 mg/2 mL injection 80 mg (80 mg Intravenous Given 04/20/18 0531)  diphenhydrAMINE (BENADRYL) injection 25 mg (25 mg Intravenous Given 04/20/18 0532)     Initial Impression / Assessment and Plan / ED Course  I have reviewed the triage vital signs and the nursing notes.  Pertinent labs & imaging results that were available during my care of the patient were  reviewed by me and considered in my medical decision making (see chart for details).     She presents to the ED for possible allergic reaction to amoxicillin.  Seen in urgent care earlier today given steroid shot with little improvement.  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  On exam patient has urticarial-like rash to the entire body.  Oropharynx is clear.  No angioedema.  No wheezing noted.  Patient appears to be in no acute distress.  Managing secretions tolerating airway.  Vital signs are reassuring.  Patient will be given Benadryl, fluids, steroids and Pepcid in the ED.  No indication for epi at this time.  Will reassess in the patient's symptoms not improving will consider adding epinephrine.  She has no mucosal involvement and is afebrile.  Doubt SJS.   Care handoff to provider. Pt has pending at this time reassessment after medication. Care dicussed and plan agreed upon with oncoming PA. Pt updated on  plan of care and is currently hemodynamically stable at this time with normal vs.       Final Clinical Impressions(s) / ED Diagnoses   Final diagnoses:  Allergic reaction, initial encounter    ED Discharge Orders    None       Aaron Edelman 04/20/18 1062    Ripley Fraise, MD 04/29/18 1139

## 2018-04-20 NOTE — ED Provider Notes (Signed)
Wanda Thornton is a 57 y.o. female, presenting to the ED with allergic reaction.  She states she can think of to possible culprits.  She was on the last day of a 10-day course of amoxicillin when symptoms began.  She has also been using a new face and body scrub about 3 times in the last week.  She states her rash started in the areas where she has been able to reach with the scrub.   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  Patient states that she was started on amoxicillin approximately 10 days ago for a dental infection.  States that she was supposed to take the last dose of her medication yesterday.  Patient went to urgent care for rash to her entire body.  She was given steroid injection in the office.  Patient states that she went home this evening and the rash significantly worsened.  She felt like her ears were swelling.  Patient at one time felt that her throat was closing however this completely resolved.  Patient did take a hydroxyzine at approximately 2:00 this morning.  Patient reports intense pruritus with worsening rash.  Denies any difficulties breathing or swallowing at this time.  Denies any associated wheezing.  Patient denies any nausea or vomiting.  Denies shortness of breath.  Denies any other associated symptoms.  The reason the patient came to the ED was worsening rash."  Past Medical History:  Diagnosis Date  . Allergy   . Hypertension      Physical Exam  BP (!) 152/95 (BP Location: Right Arm)   Pulse (!) 106   Temp 98.3 F (36.8 C) (Oral)   Resp 18   SpO2 100%   Physical Exam  Constitutional: She appears well-developed and well-nourished. No distress.  HENT:  Head: Normocephalic and atraumatic.  Mouth/Throat: Oropharynx is clear and moist.  No swelling noted to the face or neck.  Angioedema.  Eyes: Conjunctivae are normal.  Neck: Neck  supple.  Cardiovascular: Normal rate, regular rhythm, normal heart sounds and intact distal pulses.  Pulmonary/Chest: Effort normal and breath sounds normal. No respiratory distress.  Patient speaks in full sentences without difficulty.  No increased work of breathing.  No noted hoarseness.  Abdominal: Soft. There is no tenderness. There is no guarding.  Musculoskeletal: She exhibits no edema.  Lymphadenopathy:    She has no cervical adenopathy.  Neurological: She is alert.  Skin: Skin is warm and dry. Rash noted. She is not diaphoretic.  Urticarial rash to the trunk and extremities.  No vesicles or pustules.  No noted involvement of the face.  No intraoral lesions noted.  Psychiatric: She has a normal mood and affect. Her behavior is normal.  Nursing note and vitals reviewed.   ED Course/Procedures   Clinical Course as of Apr 21 827  Mon Apr 20, 2018  0705 Patient voices improvement in her condition.  Rash seems to have improved and itching has resolved.  Denies shortness of breath, swelling in the face or throat, N/V/D, or any other additional complaints.   [SJ]    Clinical Course User Index [SJ] Joy, Shawn C, PA-C    Procedures  Results for orders placed or performed in visit on 03/11/18  Comprehensive metabolic panel  Result Value Ref Range   Sodium 142 135 - 145 mEq/L   Potassium 3.4 (L) 3.5 - 5.1 mEq/L   Chloride 103 96 -  112 mEq/L   CO2 30 19 - 32 mEq/L   Glucose, Bld 90 70 - 99 mg/dL   BUN 12 6 - 23 mg/dL   Creatinine, Ser 0.71 0.40 - 1.20 mg/dL   Total Bilirubin 1.0 0.2 - 1.2 mg/dL   Alkaline Phosphatase 105 39 - 117 U/L   AST 16 0 - 37 U/L   ALT 15 0 - 35 U/L   Total Protein 7.1 6.0 - 8.3 g/dL   Albumin 4.3 3.5 - 5.2 g/dL   Calcium 9.4 8.4 - 10.5 mg/dL   GFR 110.11 >60.00 mL/min  Lipid panel  Result Value Ref Range   Cholesterol 170 0 - 200 mg/dL   Triglycerides 83.0 0.0 - 149.0 mg/dL   HDL 36.50 (L) >39.00 mg/dL   VLDL 16.6 0.0 - 40.0 mg/dL   LDL  Cholesterol 117 (H) 0 - 99 mg/dL   Total CHOL/HDL Ratio 5    NonHDL 133.39   Hepatitis C antibody screen  Result Value Ref Range   Hepatitis C Ab NON-REACTIVE NON-REACTI   SIGNAL TO CUT-OFF 0.01 <1.00  TSH  Result Value Ref Range   TSH 1.45 0.35 - 4.50 uIU/mL   No results found.  MDM   Took patient care handoff report from Chrissie [facility]) [place], [place].  Patient presents with what appears to be an allergic reaction.  She had improvement during her ED course.  She will continue with treatment with an oral regimen of Benadryl, Pepcid, and prednisone.  She has been prescribed an EpiPen and usage instructions were discussed. The patient was given instructions for home care as well as return precautions. Patient voices understanding of these instructions, accepts the plan, and is comfortable with discharge.    Vitals:   04/20/18 0358 04/20/18 0705  BP: (!) 152/95 (!) 144/86  Pulse: (!) 106 96  Resp: 18   Temp: 98.3 F (36.8 C)   TempSrc: Oral   SpO2: 100% 100%      Layla Maw 04/20/18 0831    Ripley Fraise, MD 04/21/18 [phone]

## 2018-04-20 NOTE — Discharge Instructions (Addendum)
Allergic Reaction Instructions:  Benadryl: Take 25 mg of Benadryl every 6 hours for the next 24 hours.  Use caution as Benadryl can make you drowsy. Pepcid: Take the Pepcid, as prescribed, over the next 3 days. Prednisone: Take prednisone, as prescribed, until finished. Epinephrine: Keep an EpiPen with you at all times.  The EpiPen is to be used for anaphylaxis, a severe allergic reaction.  Refer to the attached instructions for signs of anaphylaxis.  Follow-up with your primary care provider on this matter.  Allergy testing with an allergist may be warranted.  Return to the ED for worsening symptoms, shortness of breath, chest pain, palpitations, persistent vomiting, facial or throat swelling, or any other major concerns.

## 2018-04-20 NOTE — ED Triage Notes (Signed)
C/o allergic reaction, hives all over, feeling like ears are swollen and throat feels like it is beginning to swell.  Denies SOB.  Pt has known allergy to cephalosporins and was on last day of amoxicillin for dental infection.  Reports rash that started yesterday.  Seen at [facility] and given steroid injection and Hydroxyzine.  Pt states symptoms are much worse now.

## 2018-04-20 NOTE — ED Notes (Signed)
Pt has hives all over body at this time starting from neck all the ways down her back and to her legs at this time.

## 2018-04-22 ENCOUNTER — Encounter: Payer: 59 | Admitting: Physical Therapy

## 2018-04-22 ENCOUNTER — Other Ambulatory Visit: Payer: Self-pay | Admitting: *Deleted

## 2018-04-22 MED ORDER — POTASSIUM CHLORIDE CRYS ER 10 MEQ PO TBCR: 10.0000 meq | EXTENDED_RELEASE_TABLET | Freq: Two times a day (BID) | ORAL | 1 refills | Status: DC

## 2018-04-23 ENCOUNTER — Ambulatory Visit: Payer: 59 | Admitting: Physical Therapy

## 2018-04-23 ENCOUNTER — Encounter: Payer: Self-pay | Admitting: Physical Therapy

## 2018-04-23 DIAGNOSIS — M25612 Stiffness of left shoulder, not elsewhere classified: Secondary | ICD-10-CM

## 2018-04-23 DIAGNOSIS — M6281 Muscle weakness (generalized): Secondary | ICD-10-CM

## 2018-04-23 DIAGNOSIS — M25512 Pain in left shoulder: Secondary | ICD-10-CM

## 2018-04-23 NOTE — Patient Instructions (Addendum)
         Access Code: [email or web address]  URL: [email or web address]  Date: 04/23/2018  Prepared by: [name]   Exercises  Supine Shoulder Flexion Overhead with Dowel - 10 reps - 1 sets - 1x daily - 7x weekly  Supine Shoulder Horizontal Abduction Adduction AAROM with Dowel - 10 reps - 1 sets - 1x daily - 7x weekly  Supine Shoulder External Rotation AAROM with Dowel - 10 reps - 1 sets - 1x daily - 7x weekly  Standing Single Arm Row with Resistance Thumb Up - 10 reps - 1 sets - 1x daily - 7x weekly  Single Arm Shoulder Extension with Resistance - 10 reps - 1 sets - 1x daily - 7x weekly          [facility] [address] [place], [place] [address] Phone # [phone] Fax [phone]

## 2018-04-23 NOTE — Therapy (Signed)
[facility] [address], [place], [place], [address] Phone: [phone]   Fax:  [phone]  Physical Therapy Treatment  Patient Details  Name: Wanda Thornton MRN: [id number] Date of Birth: 1961/07/30 Referring Provider: Dr. Betty Martinique    Encounter Date: 04/23/2018  PT End of Session - 04/23/18 1948    Visit Number  2    Date for PT Re-Evaluation  06/11/18    PT Start Time  0847    PT Stop Time  0930    PT Time Calculation (min)  43 min    Activity Tolerance  Patient tolerated treatment well       Past Medical History:  Diagnosis Date  . Allergy   . Hypertension     Past Surgical History:  Procedure Laterality Date  . BREAST BIOPSY      There were no vitals filed for this visit.  Subjective Assessment - 04/23/18 0853    Subjective  On antibiotic for tooth infection.  I had an allergic reaction to amoxicillin.  Had a steroid shot and pills for itching at Urgent Care.  Later that night it got worse and my leg started to swell.  Went to ED.    Had IV steroids and Bendadryl.  I'm taking an oral steroid twice.  The steroid has helped my shoulder.      Pertinent History  Right hand trigger finger surgery 4 fingers,  Left hand bracing no surgery;  Ergo set up at work; sleeps on wedge CPAP    Currently in Pain?  No/denies    Pain Score  0-No pain    Pain Orientation  Left;Anterior                       [facility] Adult PT Treatment/Exercise - 04/23/18 0001      Shoulder Exercises: Supine   Horizontal ABduction  AAROM;Left;10 reps UE Ranger    External Rotation  AAROM;Left;10 reps    Flexion  AAROM;Left;10 reps UE Ranger      Shoulder Exercises: Standing   Extension  Strengthening;Left;10 reps;Theraband    Theraband Level (Shoulder Extension)  Level 1 (Yellow)    Row  Left;10 reps;Theraband    Theraband Level (Shoulder Row)  Level 1 (Yellow)    Other Standing Exercises  UE Ranger on table  10x flexion    Other Standing Exercises  left railing slides 10x             PT Education - 04/23/18 1947    Education Details  supine cane ex; yellow band rows and extensions   Access Code: [email or web address]     Person(s) Educated  Patient    Methods  Explanation;Demonstration;Handout    Comprehension  Verbalized understanding;Returned demonstration       PT Short Term Goals - 04/16/18 1805      PT SHORT TERM GOAL #1   Title  The patient will demonstrate basic concepts of postural correction to correct shoulder alignment and initial HEP    Time  4    Period  Weeks    Status  New    Target Date  05/14/18      PT SHORT TERM GOAL #2   Title  The patient will report a 30% reduction in left shoulder pain with work activities and driving    Time  4    Period  Weeks    Status  New      PT SHORT TERM  GOAL #3   Title  The patient will have improved shoulder flexion and abduction to 145 degrees needed for reaching the car door    Time  4    Period  Days    Status  New      PT SHORT TERM GOAL #4   Title  The patient will have improved internal rotation ROM to 50 degrees and decreased pain with horizontal adduction needed for turning the steering wheel    Time  4    Period  Weeks    Status  New        PT Long Term Goals - 04/16/18 1811      PT LONG TERM GOAL #1   Title  The patient will be independent with safe self progression of HEP    Time  8    Period  Weeks    Status  New    Target Date  06/11/18      PT LONG TERM GOAL #2   Title  The patient will report a 60% improvement in left shoulder pain with dressing, driving, reaching and work ADLS    Time  8    Period  Weeks    Status  New      PT LONG TERM GOAL #3   Title  Left shoulder elevation to 150 degrees needed for reaching    Time  8    Period  Weeks    Status  New      PT LONG TERM GOAL #4   Title  Left glenoumeral and scapular strength to grossly 4/5 to 4+/5 needed for lifting/carrying light to medium  objects    Time  8    Period  Weeks    Status  New      PT LONG TERM GOAL #5   Title  FOTO functional outcome score improved from 50% limitation to 33% indicating improved function with less pain    Time  8    Period  Weeks    Status  New            Plan - 04/23/18 6073    Clinical Impression Statement  The patient reports she had an allergic reaction to an antibiotic and has had a steroid injection, and continues to be on an oral steroid.  Will hold iontophoresis in PT since she has had these steroids.  She has improved shoulder ROM in all planes.  With verbal and tactile cues she is able to activate scapular retractors and depressors  needed for glenohumeral support.  Shoulder pain intensity is low with all ex.      Rehab Potential  Good    PT Frequency  2x / week    PT Treatment/Interventions  ADLs/Self Care Home Management;Iontophoresis 4mg /ml Dexamethasone;Cryotherapy;Electrical Stimulation;Ultrasound;Moist Heat;Therapeutic activities;Therapeutic exercise;Patient/family education;Neuromuscular re-education;Manual techniques;Dry needling;Taping    PT Next Visit Plan  complete work form for standing desk recommendation; joint mobs,  UE Ranger on wall;  progress scapular strengthening;  KT tape as needed    PT Home Exercise Plan   Access Code: [email or web address]        Patient will benefit from skilled therapeutic intervention in order to improve the following deficits and impairments:  Pain, Postural dysfunction, Decreased activity tolerance, Decreased range of motion, Decreased strength, Impaired UE functional use  Visit Diagnosis: Acute pain of left shoulder  Stiffness of left shoulder, not elsewhere classified  Muscle weakness (generalized)     Problem List Patient Active Problem List  Diagnosis Date Noted  . OSA on CPAP 11/12/2016  . Vitamin D deficiency 11/12/2016  . Asthma, extrinsic, moderate persistent, uncomplicated 22/56/7209  . Seasonal and perennial allergic  rhinitis 11/12/2016  . Hypertension, essential, benign 11/12/2016  . BMI 36.0-36.9,adult 11/12/2016   [name], PT 04/23/18 7:59 PM Phone: [phone] Fax: [phone]  [name] 04/23/2018, 7:59 PM  [facility] [address], [place], [place], [address] Phone: [phone]   Fax:  [phone]  Name: Chonita Gadea MRN: [id number] Date of Birth: 30-Jul-1961

## 2018-04-28 ENCOUNTER — Ambulatory Visit: Payer: 59 | Admitting: Physical Therapy

## 2018-04-28 ENCOUNTER — Encounter: Payer: Self-pay | Admitting: Physical Therapy

## 2018-04-28 DIAGNOSIS — M6281 Muscle weakness (generalized): Secondary | ICD-10-CM

## 2018-04-28 DIAGNOSIS — M25512 Pain in left shoulder: Secondary | ICD-10-CM | POA: Diagnosis not present

## 2018-04-28 DIAGNOSIS — M25612 Stiffness of left shoulder, not elsewhere classified: Secondary | ICD-10-CM

## 2018-04-28 NOTE — Therapy (Signed)
[facility] [address] [place], [place], [address] Phone: [phone]   Fax:  [phone]  Physical Therapy Treatment  Patient Details  Name: Wanda Thornton MRN: [id number] Date of Birth: 1961-01-18 Referring Provider: Dr. Betty Martinique    Encounter Date: 04/28/2018  PT End of Session - 04/28/18 1651    Visit Number  3    Date for PT Re-Evaluation  06/11/18    PT Start Time  1615    PT Stop Time  1653    PT Time Calculation (min)  38 min    Activity Tolerance  Patient tolerated treatment well       Past Medical History:  Diagnosis Date  . Allergy   . Hypertension     Past Surgical History:  Procedure Laterality Date  . BREAST BIOPSY      There were no vitals filed for this visit.  Subjective Assessment - 04/28/18 1616    Subjective  Finishing last few days of steroid for antibiotic allergic reaction.      Pertinent History  Right hand trigger finger surgery 4 fingers,  Left hand bracing no surgery;  Ergo set up at work; sleeps on wedge CPAP    Currently in Pain?  Yes    Pain Score  1     Pain Orientation  Left                       [facility] Adult PT Treatment/Exercise - 04/28/18 0001      Shoulder Exercises: Standing   External Rotation  Strengthening;Left;15 reps;Theraband    Theraband Level (Shoulder External Rotation)  Level 1 (Yellow)    Internal Rotation  Strengthening;Left;15 reps;Theraband    Theraband Level (Shoulder Internal Rotation)  Level 1 (Yellow)    Other Standing Exercises  UE Ranger on floor 10x    Other Standing Exercises  wall push ups 10x      Shoulder Exercises: Pulleys   Flexion  2 minutes      Shoulder Exercises: ROM/Strengthening   Other ROM/Strengthening Exercises  yellow band steering wheel motion 10x    Other ROM/Strengthening Exercises  ball on wall circles 10x each way.        Manual Therapy   Joint Mobilization  glenohumeral joint mobs grade 3  distraction, inferior, AP mob with movement     Scapular Mobilization  medial and lateral glides; resisted scapular retraction/depression 10x    Kinesiotex  Facilitate Muscle      Kinesiotix   Facilitate Muscle   3 strips anterior, posterior deltoid and lateral strip                PT Short Term Goals - 04/16/18 1805      PT SHORT TERM GOAL #1   Title  The patient will demonstrate basic concepts of postural correction to correct shoulder alignment and initial HEP    Time  4    Period  Weeks    Status  New    Target Date  05/14/18      PT SHORT TERM GOAL #2   Title  The patient will report a 30% reduction in left shoulder pain with work activities and driving    Time  4    Period  Weeks    Status  New      PT SHORT TERM GOAL #3   Title  The patient will have improved shoulder flexion and abduction to 145 degrees needed  for reaching the car door    Time  4    Period  Days    Status  New      PT SHORT TERM GOAL #4   Title  The patient will have improved internal rotation ROM to 50 degrees and decreased pain with horizontal adduction needed for turning the steering wheel    Time  4    Period  Weeks    Status  New        PT Long Term Goals - 04/16/18 1811      PT LONG TERM GOAL #1   Title  The patient will be independent with safe self progression of HEP    Time  8    Period  Weeks    Status  New    Target Date  06/11/18      PT LONG TERM GOAL #2   Title  The patient will report a 60% improvement in left shoulder pain with dressing, driving, reaching and work ADLS    Time  8    Period  Weeks    Status  New      PT LONG TERM GOAL #3   Title  Left shoulder elevation to 150 degrees needed for reaching    Time  8    Period  Weeks    Status  New      PT LONG TERM GOAL #4   Title  Left glenoumeral and scapular strength to grossly 4/5 to 4+/5 needed for lifting/carrying light to medium objects    Time  8    Period  Weeks    Status  New      PT LONG TERM  GOAL #5   Title  FOTO functional outcome score improved from 50% limitation to 33% indicating improved function with less pain    Time  8    Period  Weeks    Status  New            Plan - 04/28/18 1657    Clinical Impression Statement  The patient reports mild discomfort with low level exercises.  Therapist closely monitoring response and modifying as needed.  Form completed for her work requesting variable standing desk.  Improving passive and active assisted shoulder ROM.      Rehab Potential  Good    PT Frequency  2x / week    PT Duration  8 weeks    PT Treatment/Interventions  ADLs/Self Care Home Management;Iontophoresis 4mg /ml Dexamethasone;Cryotherapy;Electrical Stimulation;Ultrasound;Moist Heat;Therapeutic activities;Therapeutic exercise;Patient/family education;Neuromuscular re-education;Manual techniques;Dry needling;Taping    PT Next Visit Plan  recheck ROM;  assess response to KT tape;  add yellow band internal and external rotation;  pulleys, try UE Ranger on wall        Patient will benefit from skilled therapeutic intervention in order to improve the following deficits and impairments:  Pain, Postural dysfunction, Decreased activity tolerance, Decreased range of motion, Decreased strength, Impaired UE functional use  Visit Diagnosis: Acute pain of left shoulder  Stiffness of left shoulder, not elsewhere classified  Muscle weakness (generalized)     Problem List Patient Active Problem List   Diagnosis Date Noted  . OSA on CPAP 11/12/2016  . Vitamin D deficiency 11/12/2016  . Asthma, extrinsic, moderate persistent, uncomplicated 79/11/4095  . Seasonal and perennial allergic rhinitis 11/12/2016  . Hypertension, essential, benign 11/12/2016  . BMI 36.0-36.9,adult 11/12/2016   [name], PT 04/28/18 5:03 PM Phone: [phone] Fax: [phone]  [name] 04/28/2018, 5:02 PM  [facility] [address] [place], [place], [address] Phone: [phone]   Fax:  [phone]  Name: Wanda Thornton MRN: [id number] Date of Birth: 1961/06/24

## 2018-04-30 ENCOUNTER — Ambulatory Visit: Payer: 59 | Attending: Family Medicine | Admitting: Physical Therapy

## 2018-04-30 ENCOUNTER — Encounter: Payer: Self-pay | Admitting: Physical Therapy

## 2018-04-30 DIAGNOSIS — M6281 Muscle weakness (generalized): Secondary | ICD-10-CM

## 2018-04-30 DIAGNOSIS — M25612 Stiffness of left shoulder, not elsewhere classified: Secondary | ICD-10-CM | POA: Diagnosis present

## 2018-04-30 DIAGNOSIS — M25512 Pain in left shoulder: Secondary | ICD-10-CM | POA: Diagnosis present

## 2018-04-30 NOTE — Therapy (Signed)
[facility] [address], [place], [place], [address] Phone: [phone]   Fax:  [phone]  Physical Therapy Treatment  Patient Details  Name: Wanda Thornton MRN: [id number] Date of Birth: September 14, 1961 Referring Provider: Dr. Betty Martinique    Encounter Date: 04/30/2018  PT End of Session - 04/30/18 0828    Visit Number  4    Date for PT Re-Evaluation  06/11/18    PT Start Time  0803    PT Stop Time  0841    PT Time Calculation (min)  38 min    Activity Tolerance  Patient tolerated treatment well       Past Medical History:  Diagnosis Date  . Allergy   . Hypertension     Past Surgical History:  Procedure Laterality Date  . BREAST BIOPSY      There were no vitals filed for this visit.  Subjective Assessment - 04/30/18 0808    Subjective  The tape really helped.  I got my standing desk approved.  I bought a cane to do shoulder assisted ex.      Currently in Pain?  No/denies    Pain Score  0-No pain    Pain Location  Shoulder    Pain Orientation  Left    Pain Type  Acute pain    Aggravating Factors   closing car door;  sleeping    Pain Relieving Factors  tape, prednisone         [facility] PT Assessment - 04/30/18 0001      AROM   Left Shoulder Flexion  145 Degrees    Left Shoulder ABduction  125 Degrees    Left Shoulder Internal Rotation  -- bra line    Left Shoulder External Rotation  84 Degrees      Strength   Left Shoulder Flexion  3+/5    Left Shoulder Extension  4/5    Left Shoulder ABduction  3/5    Left Shoulder Internal Rotation  4-/5    Left Shoulder External Rotation  4-/5                   [facility] Adult PT Treatment/Exercise - 04/30/18 0001      Shoulder Exercises: Standing   External Rotation  Strengthening;Left;15 reps;Theraband    Theraband Level (Shoulder External Rotation)  Level 1 (Yellow)    Internal Rotation  Strengthening;Left;15 reps;Theraband    Theraband  Level (Shoulder Internal Rotation)  Level 1 (Yellow)    Extension  Strengthening;Left;10 reps;Theraband    Theraband Level (Shoulder Extension)  Level 1 (Yellow)    Row  Left;10 reps;Theraband    Theraband Level (Shoulder Row)  Level 1 (Yellow)      Shoulder Exercises: Pulleys   Flexion  2 minutes    Scaption  1 minute      Shoulder Exercises: ROM/Strengthening   Ranger  on wall L0 15x     Other ROM/Strengthening Exercises  counter top internal rotation rocking 10x    Other ROM/Strengthening Exercises  red ball roll on wall for flexion/elevation 10x      Manual Therapy   Joint Mobilization  glenohumeral joint mobs grade 3 distraction, inferior, AP mob with movement              PT Education - 04/30/18 0819    Education Details   Access Code: [email or web address]   band internal and external rotation     Person(s) Educated  Patient  Methods  Explanation;Demonstration;Handout    Comprehension  Verbalized understanding;Returned demonstration       PT Short Term Goals - 04/16/18 1805      PT SHORT TERM GOAL #1   Title  The patient will demonstrate basic concepts of postural correction to correct shoulder alignment and initial HEP    Time  4    Period  Weeks    Status  New    Target Date  05/14/18      PT SHORT TERM GOAL #2   Title  The patient will report a 30% reduction in left shoulder pain with work activities and driving    Time  4    Period  Weeks    Status  New      PT SHORT TERM GOAL #3   Title  The patient will have improved shoulder flexion and abduction to 145 degrees needed for reaching the car door    Time  4    Period  Days    Status  New      PT SHORT TERM GOAL #4   Title  The patient will have improved internal rotation ROM to 50 degrees and decreased pain with horizontal adduction needed for turning the steering wheel    Time  4    Period  Weeks    Status  New        PT Long Term Goals - 04/16/18 1811      PT LONG TERM GOAL #1   Title  The patient  will be independent with safe self progression of HEP    Time  8    Period  Weeks    Status  New    Target Date  06/11/18      PT LONG TERM GOAL #2   Title  The patient will report a 60% improvement in left shoulder pain with dressing, driving, reaching and work ADLS    Time  8    Period  Weeks    Status  New      PT LONG TERM GOAL #3   Title  Left shoulder elevation to 150 degrees needed for reaching    Time  8    Period  Weeks    Status  New      PT LONG TERM GOAL #4   Title  Left glenoumeral and scapular strength to grossly 4/5 to 4+/5 needed for lifting/carrying light to medium objects    Time  8    Period  Weeks    Status  New      PT LONG TERM GOAL #5   Title  FOTO functional outcome score improved from 50% limitation to 33% indicating improved function with less pain    Time  8    Period  Weeks    Status  New            Plan - 04/30/18 0867    Clinical Impression Statement  Improved shoulder flexion and internal rotation ROM.  She reports good relief with KT taping.  Improved glenohumeral and joint mobility.  Verbal and tactile cues to encourage scapular retraction and depression.      Rehab Potential  Good    PT Frequency  2x / week    PT Duration  8 weeks    PT Treatment/Interventions  ADLs/Self Care Home Management;Iontophoresis 4mg /ml Dexamethasone;Cryotherapy;Electrical Stimulation;Ultrasound;Moist Heat;Therapeutic activities;Therapeutic exercise;Patient/family education;Neuromuscular re-education;Manual techniques;Dry needling;Taping    PT Next Visit Plan  try prone Is, Ys, Ts;  UE  Ranger on wall;  pulleys;  review band ex as needed;  KT tape    PT Home Exercise Plan   Access Code: [email or web address]        Patient will benefit from skilled therapeutic intervention in order to improve the following deficits and impairments:  Pain, Postural dysfunction, Decreased activity tolerance, Decreased range of motion, Decreased strength, Impaired UE functional use  Visit  Diagnosis: Acute pain of left shoulder  Stiffness of left shoulder, not elsewhere classified  Muscle weakness (generalized)     Problem List Patient Active Problem List   Diagnosis Date Noted  . OSA on CPAP 11/12/2016  . Vitamin D deficiency 11/12/2016  . Asthma, extrinsic, moderate persistent, uncomplicated 46/95/0722  . Seasonal and perennial allergic rhinitis 11/12/2016  . Hypertension, essential, benign 11/12/2016  . BMI 36.0-36.9,adult 11/12/2016   [name], PT 04/30/18 6:57 PM Phone: [phone] Fax: [phone] [name] 04/30/2018, 6:57 PM  [facility] [address], [place], [place], [address] Phone: [phone]   Fax:  [phone]  Name: Wanda Thornton MRN: [id number] Date of Birth: [birth year]

## 2018-05-05 ENCOUNTER — Encounter: Payer: Self-pay | Admitting: Physical Therapy

## 2018-05-05 ENCOUNTER — Ambulatory Visit: Payer: 59 | Admitting: Physical Therapy

## 2018-05-05 DIAGNOSIS — M25512 Pain in left shoulder: Secondary | ICD-10-CM | POA: Diagnosis not present

## 2018-05-05 DIAGNOSIS — M25612 Stiffness of left shoulder, not elsewhere classified: Secondary | ICD-10-CM

## 2018-05-05 DIAGNOSIS — M6281 Muscle weakness (generalized): Secondary | ICD-10-CM

## 2018-05-05 NOTE — Therapy (Signed)
[facility] [address] [place], [place], [address] Phone: [phone]   Fax:  [phone]  Physical Therapy Treatment  Patient Details  Name: Wanda Thornton MRN: [id number] Date of Birth: 1961-08-15 Referring Provider: Dr. Betty Martinique    Encounter Date: 05/05/2018  PT End of Session - 05/05/18 1709    Visit Number  5    Date for PT Re-Evaluation  06/11/18    PT Start Time  1620    PT Stop Time  1700    PT Time Calculation (min)  40 min    Activity Tolerance  Patient tolerated treatment well       Past Medical History:  Diagnosis Date  . Allergy   . Hypertension     Past Surgical History:  Procedure Laterality Date  . BREAST BIOPSY      There were no vitals filed for this visit.  Subjective Assessment - 05/05/18 1621    Subjective  Some difficulty with night pain so slept in the recliner.   Today it has been OK.  Reports good compliance with HEP.      Pertinent History  Right hand trigger finger surgery 4 fingers,  Left hand bracing no surgery;  Ergo set up at work; sleeps on wedge CPAP    Patient Stated Goals  get my motion back without pain;  what to do to prevent it;  exercises to do;  what is causing it?    Currently in Pain?  No/denies    Pain Score  0-No pain    Pain Location  Shoulder    Pain Orientation  Left                       [facility] Adult PT Treatment/Exercise - 05/05/18 0001      Shoulder Exercises: Prone   Extension  AROM;Left;10 reps    Horizontal ABduction 1  AROM;Left;10 reps    Other Prone Exercises  row 10x      Shoulder Exercises: Standing   Other Standing Exercises  ball on wall flexion 5x      Shoulder Exercises: Pulleys   Flexion  2 minutes    Scaption  2 minutes      Shoulder Exercises: ROM/Strengthening   Ranger  on wall L15 10x     Pushups  10 reps single arm push ups on beach ball    Other ROM/Strengthening Exercises  beach ball on wall A to  Z    Other ROM/Strengthening Exercises  review of HEP to discuss spacing and what to focus on       Kinesiotix   Facilitate Muscle   3 strips anterior, posterior deltoid and lateral strip                PT Short Term Goals - 04/16/18 1805      PT SHORT TERM GOAL #1   Title  The patient will demonstrate basic concepts of postural correction to correct shoulder alignment and initial HEP    Time  4    Period  Weeks    Status  New    Target Date  05/14/18      PT SHORT TERM GOAL #2   Title  The patient will report a 30% reduction in left shoulder pain with work activities and driving    Time  4    Period  Weeks    Status  New      PT SHORT  TERM GOAL #3   Title  The patient will have improved shoulder flexion and abduction to 145 degrees needed for reaching the car door    Time  4    Period  Days    Status  New      PT SHORT TERM GOAL #4   Title  The patient will have improved internal rotation ROM to 50 degrees and decreased pain with horizontal adduction needed for turning the steering wheel    Time  4    Period  Weeks    Status  New        PT Long Term Goals - 04/16/18 1811      PT LONG TERM GOAL #1   Title  The patient will be independent with safe self progression of HEP    Time  8    Period  Weeks    Status  New    Target Date  06/11/18      PT LONG TERM GOAL #2   Title  The patient will report a 60% improvement in left shoulder pain with dressing, driving, reaching and work ADLS    Time  8    Period  Weeks    Status  New      PT LONG TERM GOAL #3   Title  Left shoulder elevation to 150 degrees needed for reaching    Time  8    Period  Weeks    Status  New      PT LONG TERM GOAL #4   Title  Left glenoumeral and scapular strength to grossly 4/5 to 4+/5 needed for lifting/carrying light to medium objects    Time  8    Period  Weeks    Status  New      PT LONG TERM GOAL #5   Title  FOTO functional outcome score improved from 50% limitation to  33% indicating improved function with less pain    Time  8    Period  Weeks    Status  New            Plan - 05/05/18 1709    Clinical Impression Statement  The patient has much improved active ROM in all planes.  She reports good relief with KT taping and has no pain today although she has had some night pain.  Good carryover and compliance with HEP although she reports she may be overdoing it at times.  Reviewed her current HEP and discussed areas of focus and decreasing reps on some ex.   On track to meet STGs.      Rehab Potential  Good    PT Frequency  2x / week    PT Duration  8 weeks    PT Treatment/Interventions  ADLs/Self Care Home Management;Iontophoresis 4mg /ml Dexamethasone;Cryotherapy;Electrical Stimulation;Ultrasound;Moist Heat;Therapeutic activities;Therapeutic exercise;Patient/family education;Neuromuscular re-education;Manual techniques;Dry needling;Taping    PT Next Visit Plan   prone Is, Ys, Ts;  UE Ranger on wall;  pulleys;  try plate weights;  check ROM for STGs        Patient will benefit from skilled therapeutic intervention in order to improve the following deficits and impairments:  Pain, Postural dysfunction, Decreased activity tolerance, Decreased range of motion, Decreased strength, Impaired UE functional use  Visit Diagnosis: Acute pain of left shoulder  Stiffness of left shoulder, not elsewhere classified  Muscle weakness (generalized)     Problem List Patient Active Problem List   Diagnosis Date Noted  . OSA on CPAP 11/12/2016  .  Vitamin D deficiency 11/12/2016  . Asthma, extrinsic, moderate persistent, uncomplicated 83/38/2505  . Seasonal and perennial allergic rhinitis 11/12/2016  . Hypertension, essential, benign 11/12/2016  . BMI 36.0-36.9,adult 11/12/2016   Wanda Thornton, PT 05/05/18 5:13 PM Phone: [phone] Fax: [phone]  [name] 05/05/2018, 5:13 PM  [facility] [address], [place], [place], [address] Phone: [phone]   Fax:  [phone]  Name: Wanda Thornton MRN: [id number] Date of Birth: [birth year]

## 2018-05-07 ENCOUNTER — Encounter: Payer: Self-pay | Admitting: Physical Therapy

## 2018-05-07 ENCOUNTER — Ambulatory Visit: Payer: 59 | Admitting: Physical Therapy

## 2018-05-07 DIAGNOSIS — M25612 Stiffness of left shoulder, not elsewhere classified: Secondary | ICD-10-CM

## 2018-05-07 DIAGNOSIS — M25512 Pain in left shoulder: Secondary | ICD-10-CM | POA: Diagnosis not present

## 2018-05-07 DIAGNOSIS — M6281 Muscle weakness (generalized): Secondary | ICD-10-CM

## 2018-05-07 NOTE — Therapy (Signed)
[facility] [address], [place], [place], [address] Phone: [phone]   Fax:  [phone]  Physical Therapy Treatment  Patient Details  Name: Wanda Thornton MRN: [id number] Date of Birth: December 26, 1960 Referring Provider: Dr. Betty Martinique    Encounter Date: 05/07/2018  PT End of Session - 05/07/18 0839    Visit Number  6    Date for PT Re-Evaluation  06/11/18    PT Start Time  0812   patient late   PT Stop Time  0844    PT Time Calculation (min)  32 min    Activity Tolerance  Patient tolerated treatment well       Past Medical History:  Diagnosis Date  . Allergy   . Hypertension     Past Surgical History:  Procedure Laterality Date  . BREAST BIOPSY      There were no vitals filed for this visit.  Subjective Assessment - 05/07/18 0813    Subjective  Arrives late.  No shoulder pain this morning.  I got my standing desk for work yesterday.      Patient Stated Goals  get my motion back without pain;  what to do to prevent it;  exercises to do;  what is causing it?    Currently in Pain?  No/denies    Pain Score  0-No pain    Pain Type  Acute pain         [facility] PT Assessment - 05/07/18 0001      AROM   Left Shoulder Flexion  160 Degrees    Left Shoulder ABduction  163 Degrees    Left Shoulder Internal Rotation  --   bra line T8   Left Shoulder External Rotation  84 Degrees      Strength   Left Shoulder Flexion  4-/5    Left Shoulder Extension  4/5    Left Shoulder ABduction  4-/5    Left Shoulder Internal Rotation  4-/5    Left Shoulder External Rotation  4-/5                   [facility] Adult PT Treatment/Exercise - 05/07/18 0001      Shoulder Exercises: Prone   Extension  AROM;Left;10 reps    Horizontal ABduction 1  AROM;Left;10 reps   scaption at 100 degrees    Other Prone Exercises  row 10x      Shoulder Exercises: Standing   Other Standing Exercises  doorway pec stretch  3 positions 20 sec each      Shoulder Exercises: Pulleys   Flexion  2 minutes    Scaption  2 minutes   internal rotation with pulleys 1 min     Shoulder Exercises: ROM/Strengthening   Ranger  on wall L17 10x     Other ROM/Strengthening Exercises  UBE 3 min backwards only      Shoulder Exercises: Power [employer]  10 reps    Row Limitations  15#    Other Power Tower Exercises  lat bar 15# 10x             PT Education - 05/07/18 [phone]    Education Details   Access Code: [email or web address]  prone scaption     Person(s) Educated  Patient    Methods  Explanation;Demonstration;Handout    Comprehension  Verbalized understanding;Returned demonstration       PT Short Term Goals - 05/07/18 1500      PT SHORT  TERM GOAL #1   Title  The patient will demonstrate basic concepts of postural correction to correct shoulder alignment and initial HEP    Status  Achieved      PT SHORT TERM GOAL #2   Title  The patient will report a 30% reduction in left shoulder pain with work activities and driving    Time  4    Period  Weeks    Status  On-going      PT SHORT TERM GOAL #3   Title  The patient will have improved shoulder flexion and abduction to 145 degrees needed for reaching the car door    Status  Achieved      PT SHORT TERM GOAL #4   Title  The patient will have improved internal rotation ROM to 50 degrees and decreased pain with horizontal adduction needed for turning the steering wheel    Status  Achieved        PT Long Term Goals - 04/16/18 1811      PT LONG TERM GOAL #1   Title  The patient will be independent with safe self progression of HEP    Time  8    Period  Weeks    Status  New    Target Date  06/11/18      PT LONG TERM GOAL #2   Title  The patient will report a 60% improvement in left shoulder pain with dressing, driving, reaching and work ADLS    Time  8    Period  Weeks    Status  New      PT LONG TERM GOAL #3   Title  Left shoulder elevation to 150 degrees  needed for reaching    Time  8    Period  Weeks    Status  New      PT LONG TERM GOAL #4   Title  Left glenoumeral and scapular strength to grossly 4/5 to 4+/5 needed for lifting/carrying light to medium objects    Time  8    Period  Weeks    Status  New      PT LONG TERM GOAL #5   Title  FOTO functional outcome score improved from 50% limitation to 33% indicating improved function with less pain    Time  8    Period  Weeks    Status  New            Plan - 05/07/18 4098    Clinical Impression Statement  The patient has much improved shoulder flexion and abduction ROM.  She is able to perform a progression of glenohumeral and scapular muscle strengthening without an exacerbation of pain.  Verbal and tactile cues to decrease compensatory shoulder hike and to encourage middle and lower trap muscle activation.  Good response to KT tape for muscular support.      Rehab Potential  Good    PT Frequency  2x / week    PT Duration  8 weeks    PT Treatment/Interventions  ADLs/Self Care Home Management;Iontophoresis 4mg /ml Dexamethasone;Cryotherapy;Electrical Stimulation;Ultrasound;Moist Heat;Therapeutic activities;Therapeutic exercise;Patient/family education;Neuromuscular re-education;Manual techniques;Dry needling;Taping    PT Next Visit Plan   prone Is, Ys, Ts;  UE Ranger on wall;  pulleys;  plate weights;  add pectoral stretch to HEP;  KT;  hold on ionto since just finished steroid    PT Home Exercise Plan   Access Code: [email or web address]        Patient will benefit from skilled therapeutic  intervention in order to improve the following deficits and impairments:  Pain, Postural dysfunction, Decreased activity tolerance, Decreased range of motion, Decreased strength, Impaired UE functional use  Visit Diagnosis: Acute pain of left shoulder  Stiffness of left shoulder, not elsewhere classified  Muscle weakness (generalized)     Problem List Patient Active Problem List   Diagnosis Date  Noted  . OSA on CPAP 11/12/2016  . Vitamin D deficiency 11/12/2016  . Asthma, extrinsic, moderate persistent, uncomplicated 62/69/4854  . Seasonal and perennial allergic rhinitis 11/12/2016  . Hypertension, essential, benign 11/12/2016  . BMI 36.0-36.9,adult 11/12/2016     [name], PT 05/07/18 3:04 PM Phone: [phone] Fax: [phone]  [name] 05/07/2018, 3:02 PM  [facility] [address] [place], [place], [address] Phone: [phone]   Fax:  [phone]  Name: Alanna Storti MRN: [id number] Date of Birth: 07-04-1961

## 2018-05-07 NOTE — Patient Instructions (Signed)
Access Code: [email or web address]  URL: [email or web address]  Date: 05/07/2018  Prepared by: [name]   Exercises  Supine Shoulder Flexion Overhead with Dowel - 10 reps - 1 sets - 1x daily - 7x weekly  Supine Shoulder Horizontal Abduction Adduction AAROM with Dowel - 10 reps - 1 sets - 1x daily - 7x weekly  Supine Shoulder External Rotation AAROM with Dowel - 10 reps - 1 sets - 1x daily - 7x weekly  Standing Single Arm Row with Resistance Thumb Up - 10 reps - 1 sets - 1x daily - 7x weekly  Single Arm Shoulder Extension with Resistance - 10 reps - 1 sets - 1x daily - 7x weekly  Shoulder Internal Rotation with Resistance - 10 reps - 3 sets - 1x daily - 7x weekly  Shoulder External Rotation with Anchored Resistance - 10 reps - 1 sets - 1x daily - 7x weekly  Prone Shoulder Row - 10 reps - 1 sets - 1x daily - 7x weekly  Prone Shoulder Extension - Single Arm - 10 reps - 1 sets - 1x daily - 7x weekly  Prone Shoulder Horizontal Abduction - 10 reps - 1 sets - 1x daily - 7x weekly  Prone Single Arm Shoulder Y - 10 reps - 1 sets - 1x daily - 7x weekly        [facility] [address], [place], [place] [address] Phone # [phone] Fax [phone]

## 2018-05-12 ENCOUNTER — Encounter: Payer: 59 | Admitting: Physical Therapy

## 2018-05-14 ENCOUNTER — Encounter: Payer: Self-pay | Admitting: Physical Therapy

## 2018-05-14 ENCOUNTER — Ambulatory Visit: Payer: 59 | Admitting: Physical Therapy

## 2018-05-14 DIAGNOSIS — M25512 Pain in left shoulder: Secondary | ICD-10-CM | POA: Diagnosis not present

## 2018-05-14 DIAGNOSIS — M6281 Muscle weakness (generalized): Secondary | ICD-10-CM

## 2018-05-14 DIAGNOSIS — M25612 Stiffness of left shoulder, not elsewhere classified: Secondary | ICD-10-CM

## 2018-05-14 NOTE — Therapy (Signed)
[facility] [address] [place], [place], [address] Phone: [phone]   Fax:  [phone]  Physical Therapy Treatment  Patient Details  Name: Wanda Thornton MRN: [id number] Date of Birth: [birth year] Referring Provider: Dr. Betty Martinique    Encounter Date: 05/14/2018  PT End of Session - 05/14/18 0922    Visit Number  7    Date for PT Re-Evaluation  06/11/18    PT Start Time  0805    PT Stop Time  1779    PT Time Calculation (min)  39 min    Activity Tolerance  Patient tolerated treatment well       Past Medical History:  Diagnosis Date  . Allergy   . Hypertension     Past Surgical History:  Procedure Laterality Date  . BREAST BIOPSY      There were no vitals filed for this visit.  Subjective Assessment - 05/14/18 0812    Subjective  I like the exercises lying down.  Been doing cane stretches and the ex's on the wall.  Pain has been good.  I was able to pick up my co-worker's child without pain.  Doing better on shutting the car door.   I like my standing desk at work.  I'm 80-90% better.      Patient Stated Goals  get my motion back without pain;  what to do to prevent it;  exercises to do;  what is causing it?    Currently in Pain?  No/denies    Pain Score  0-No pain    Pain Location  Shoulder    Pain Orientation  Left    Pain Type  Acute pain    Pain Frequency  Intermittent    Aggravating Factors   sleeping but probably job stress     Pain Relieving Factors  tape                        [facility] Adult PT Treatment/Exercise - 05/14/18 0001      Shoulder Exercises: Prone   Extension  AROM;Left;10 reps    Horizontal ABduction 1  AROM;Left;10 reps   scaption at 100 degrees    Other Prone Exercises  scaption thumb up 10x      Shoulder Exercises: Standing   Other Standing Exercises  diagonal extensions power tower 10x each; shoulder extension 15# power tower 10x    Other Standing  Exercises  doorway pec stretch 3 positions 20 sec each      Shoulder Exercises: Pulleys   Flexion  2 minutes    Scaption  2 minutes      Shoulder Exercises: ROM/Strengthening   Ranger  on wall L20 10x     Other ROM/Strengthening Exercises  UBE 3 min backwards only      Shoulder Exercises: Power [employer]  20 reps   vertical and 2nd set with horizontal grip    Row Limitations  15#    Other Power Tower Exercises  lat bar 20# 10x2      K[employer]   Facilitate Muscle   3 strips anterior, posterior deltoid and lateral strip                PT Short Term Goals - 05/14/18 0817      PT SHORT TERM GOAL #1   Title  The patient will demonstrate basic concepts of postural correction to correct shoulder alignment and initial HEP  Status  Achieved      PT SHORT TERM GOAL #2   Title  The patient will report a 30% reduction in left shoulder pain with work activities and driving    Status  Achieved      PT [place] #3   Title  The patient will have improved shoulder flexion and abduction to 145 degrees needed for reaching the car door    Status  Achieved      PT SHORT TERM GOAL #4   Title  The patient will have improved internal rotation ROM to 50 degrees and decreased pain with horizontal adduction needed for turning the steering wheel    Status  Achieved        PT Long Term Goals - 04/16/18 1811      PT LONG TERM GOAL #1   Title  The patient will be independent with safe self progression of HEP    Time  8    Period  Weeks    Status  New    Target Date  06/11/18      PT LONG TERM GOAL #2   Title  The patient will report a 60% improvement in left shoulder pain with dressing, driving, reaching and work ADLS    Time  8    Period  Weeks    Status  New      PT LONG TERM GOAL #3   Title  Left shoulder elevation to 150 degrees needed for reaching    Time  8    Period  Weeks    Status  New      PT LONG TERM GOAL #4   Title  Left glenoumeral and scapular  strength to grossly 4/5 to 4+/5 needed for lifting/carrying light to medium objects    Time  8    Period  Weeks    Status  New      PT LONG TERM GOAL #5   Title  FOTO functional outcome score improved from 50% limitation to 33% indicating improved function with less pain    Time  8    Period  Weeks    Status  New            Plan - 05/14/18 0086    Clinical Impression Statement  80-90% better overall.  Demonstrates good compliance with current  HEP with focus on scapular stabilization strengthening. Minimal cues needed for middle and lower trap activation.  Minimal upper trap compensation.  ROM continues to improve.      Rehab Potential  Good    PT Frequency  2x / week    PT Duration  8 weeks    PT Treatment/Interventions  ADLs/Self Care Home Management;Iontophoresis 4mg /ml Dexamethasone;Cryotherapy;Electrical Stimulation;Ultrasound;Moist Heat;Therapeutic activities;Therapeutic exercise;Patient/family education;Neuromuscular re-education;Manual techniques;Dry needling;Taping    PT Next Visit Plan   prone Is, Ys, Ts;  UE Ranger on wall;  pulleys;  plate weights;   KT;  hold on ionto since just finished steroid; decrease frequency to once a week    PT Home Exercise Plan   Access Code: [email or web address]        Patient will benefit from skilled therapeutic intervention in order to improve the following deficits and impairments:  Pain, Postural dysfunction, Decreased activity tolerance, Decreased range of motion, Decreased strength, Impaired UE functional use  Visit Diagnosis: Acute pain of left shoulder  Stiffness of left shoulder, not elsewhere classified  Muscle weakness (generalized)     Problem List Patient Active Problem List  Diagnosis Date Noted  . OSA on CPAP 11/12/2016  . Vitamin D deficiency 11/12/2016  . Asthma, extrinsic, moderate persistent, uncomplicated 60/47/9987  . Seasonal and perennial allergic rhinitis 11/12/2016  . Hypertension, essential, benign 11/12/2016  .  BMI 36.0-36.9,adult 11/12/2016   [name], PT 05/14/18 9:23 PM Phone: [phone] Fax: [phone] [name] 05/14/2018, Sandi Mariscal PM  [facility] [address], [place], [place], [address] Phone: [phone]   Fax:  [phone]  Name: Rosaura Bolon MRN: [id number] Date of Birth: 07-30-1961

## 2018-05-15 ENCOUNTER — Other Ambulatory Visit: Payer: Self-pay | Admitting: Family Medicine

## 2018-05-15 ENCOUNTER — Other Ambulatory Visit (INDEPENDENT_AMBULATORY_CARE_PROVIDER_SITE_OTHER): Payer: 59

## 2018-05-15 DIAGNOSIS — E876 Hypokalemia: Secondary | ICD-10-CM

## 2018-05-15 LAB — POTASSIUM: POTASSIUM: 3.5 meq/L (ref 3.5–5.1)

## 2018-05-18 ENCOUNTER — Telehealth: Payer: Self-pay

## 2018-05-18 NOTE — Telephone Encounter (Signed)
Message sent to Dr. Jordan for approval. 

## 2018-05-18 NOTE — Telephone Encounter (Signed)
Copied from [place] [id number]. Topic: Referral - Request >> May 18, 2018  3:32 PM Yvette Rack wrote: Reason for CRM: pt calling wanting ti get a referral for a mammogram

## 2018-05-19 ENCOUNTER — Encounter: Payer: 59 | Admitting: Physical Therapy

## 2018-05-19 ENCOUNTER — Other Ambulatory Visit: Payer: Self-pay | Admitting: Family Medicine

## 2018-05-19 DIAGNOSIS — Z1231 Encounter for screening mammogram for malignant neoplasm of breast: Secondary | ICD-10-CM

## 2018-05-19 NOTE — Telephone Encounter (Signed)
Left detailed message letting patient know that all she had to do was call [facility] and schedule her appointment.

## 2018-05-19 NOTE — Telephone Encounter (Signed)
She does not need a referral for mammogram, she can call and arrange appointment at the same place mammogram was done last year. Due for mammogram in 07/2018.  Thanks, BJ

## 2018-05-21 ENCOUNTER — Ambulatory Visit: Payer: 59 | Admitting: Physical Therapy

## 2018-05-21 ENCOUNTER — Encounter: Payer: Self-pay | Admitting: Physical Therapy

## 2018-05-21 DIAGNOSIS — M25612 Stiffness of left shoulder, not elsewhere classified: Secondary | ICD-10-CM

## 2018-05-21 DIAGNOSIS — M6281 Muscle weakness (generalized): Secondary | ICD-10-CM

## 2018-05-21 DIAGNOSIS — M25512 Pain in left shoulder: Secondary | ICD-10-CM

## 2018-05-21 NOTE — Therapy (Signed)
[facility] [address], [place], [place], [address] Phone: [phone]   Fax:  [phone]  Physical Therapy Treatment  Patient Details  Name: Wanda Thornton MRN: [id number] Date of Birth: [birth year] Referring Provider: Dr. Betty Martinique    Encounter Date: 05/21/2018  PT End of Session - 05/21/18 0823    Visit Number  8    Date for PT Re-Evaluation  06/11/18    PT Start Time  0810   pt late   PT Stop Time  0844    PT Time Calculation (min)  34 min    Activity Tolerance  Patient tolerated treatment well       Past Medical History:  Diagnosis Date  . Allergy   . Hypertension     Past Surgical History:  Procedure Laterality Date  . BREAST BIOPSY      There were no vitals filed for this visit.  Subjective Assessment - 05/21/18 0808    Subjective  Doing well.  I went to the gym.  I liked the pull down bar without difficulty 15 reps.      Patient Stated Goals  get my motion back without pain;  what to do to prevent it;  exercises to do;  what is causing it?    Currently in Pain?  No/denies   just a twinge   Pain Score  0-No pain    Pain Location  Shoulder    Pain Orientation  Left    Pain Type  Acute pain         [facility] PT Assessment - 05/21/18 0001      AROM   Left Shoulder Flexion  170 Degrees    Left Shoulder ABduction  178 Degrees    Left Shoulder Internal Rotation  --   T8   Left Shoulder External Rotation  84 Degrees      Strength   Left Shoulder Flexion  4/5    Left Shoulder Extension  4/5    Left Shoulder ABduction  4/5    Left Shoulder Internal Rotation  4/5    Left Shoulder External Rotation  4/5                   [facility] Adult PT Treatment/Exercise - 05/21/18 0001      Shoulder Exercises: Standing   Extension  Strengthening;Left;15 reps    Theraband Level (Shoulder Extension)  Level 3 (Green)    Other Standing Exercises  green band diagonals D1/D2 15x, tricep  extensions green band 15x      Shoulder Exercises: Pulleys   Flexion  2 minutes    Scaption  2 minutes      Shoulder Exercises: ROM/Strengthening   Other ROM/Strengthening Exercises  UBE 3 min backwards only      Shoulder Exercises: Stretch   Other Shoulder Stretches  3 way doorway arm stretch 20 sec holds      Shoulder Exercises: Power [employer]  20 reps   vertical and 2nd set with horizontal grip    Row Limitations  20#    Other Power Tower Exercises  20# overhead pull downs standing 15x      Kinesiotix   Facilitate Muscle   3 strips anterior, posterior deltoid and lateral strip                PT Short Term Goals - 05/14/18 0817      PT SHORT TERM GOAL #1   Title  The patient will demonstrate basic concepts of postural correction to correct shoulder alignment and initial HEP    Status  Achieved      PT SHORT TERM GOAL #2   Title  The patient will report a 30% reduction in left shoulder pain with work activities and driving    Status  Achieved      PT SHORT TERM GOAL #3   Title  The patient will have improved shoulder flexion and abduction to 145 degrees needed for reaching the car door    Status  Achieved      PT SHORT TERM GOAL #4   Title  The patient will have improved internal rotation ROM to 50 degrees and decreased pain with horizontal adduction needed for turning the steering wheel    Status  Achieved        PT Long Term Goals - 04/16/18 1811      PT LONG TERM GOAL #1   Title  The patient will be independent with safe self progression of HEP    Time  8    Period  Weeks    Status  New    Target Date  06/11/18      PT LONG TERM GOAL #2   Title  The patient will report a 60% improvement in left shoulder pain with dressing, driving, reaching and work ADLS    Time  8    Period  Weeks    Status  New      PT LONG TERM GOAL #3   Title  Left shoulder elevation to 150 degrees needed for reaching    Time  8    Period  Weeks    Status  New       PT LONG TERM GOAL #4   Title  Left glenoumeral and scapular strength to grossly 4/5 to 4+/5 needed for lifting/carrying light to medium objects    Time  8    Period  Weeks    Status  New      PT LONG TERM GOAL #5   Title  FOTO functional outcome score improved from 50% limitation to 33% indicating improved function with less pain    Time  8    Period  Weeks    Status  New            Plan - 05/21/18 1740    Clinical Impression Statement  The patient continues to progress well with ROM gains in all planes and with improved glenohumeral and scapular strength.  She demonstrates good compliance with her HEP.  Minimal cues needed for scapular retraction and depression.  Anticipate she will meet remaining goals in 2-3 visits.  All STGs met.     Rehab Potential  Good    PT Frequency  2x / week    PT Duration  8 weeks    PT Treatment/Interventions  ADLs/Self Care Home Management;Iontophoresis 21m/ml Dexamethasone;Cryotherapy;Electrical Stimulation;Ultrasound;Moist Heat;Therapeutic activities;Therapeutic exercise;Patient/family education;Neuromuscular re-education;Manual techniques;Dry needling;Taping    PT Next Visit Plan   prone Is, Ys, Ts;  UE Ranger on wall;  pulleys;  plate weights;   KT;  hold on ionto since just finished steroid; decrease frequency to once a week    PT Home Exercise Plan   Access Code: [email or web address]       Patient will benefit from skilled therapeutic intervention in order to improve the following deficits and impairments:  Pain, Postural dysfunction, Decreased activity tolerance, Decreased range of motion, Decreased strength, Impaired UE functional  use  Visit Diagnosis: Acute pain of left shoulder  Stiffness of left shoulder, not elsewhere classified  Muscle weakness (generalized)     Problem List Patient Active Problem List   Diagnosis Date Noted  . OSA on CPAP 11/12/2016  . Vitamin D deficiency 11/12/2016  . Asthma, extrinsic, moderate persistent,  uncomplicated 70/62/3762  . Seasonal and perennial allergic rhinitis 11/12/2016  . Hypertension, essential, benign 11/12/2016  . BMI 36.0-36.9,adult 11/12/2016   [name], PT 05/21/18 5:44 PM Phone: [phone] Fax: [phone]  [name] 05/21/2018, 5:44 PM  [facility] [address] [place], [place], [address] Phone: [phone]   Fax:  [phone]  Name: Indiana Gamero MRN: [id number] Date of Birth: 21-Jan-1961

## 2018-05-26 ENCOUNTER — Encounter: Payer: 59 | Admitting: Physical Therapy

## 2018-05-28 ENCOUNTER — Encounter: Payer: Self-pay | Admitting: Physical Therapy

## 2018-05-28 ENCOUNTER — Ambulatory Visit: Payer: 59 | Admitting: Physical Therapy

## 2018-05-28 DIAGNOSIS — M6281 Muscle weakness (generalized): Secondary | ICD-10-CM

## 2018-05-28 DIAGNOSIS — M25512 Pain in left shoulder: Secondary | ICD-10-CM

## 2018-05-28 DIAGNOSIS — M25612 Stiffness of left shoulder, not elsewhere classified: Secondary | ICD-10-CM

## 2018-05-28 NOTE — Patient Instructions (Addendum)
   Face mirror:   1#    Lift forward thumb up to shoulder level   10x   Lift Y shape forward to shoulder level thumb up 10x   Lift out to the side T shape to shoulder level thumb up 10x    Access Code: [email or web address]  URL: [email or web address]  Date: 05/28/2018  Prepared by: [name]   Exercises  Supine Shoulder Flexion Overhead with Dowel - 10 reps - 1 sets - 1x daily - 7x weekly  Supine Shoulder Horizontal Abduction Adduction AAROM with Dowel - 10 reps - 1 sets - 1x daily - 7x weekly  Supine Shoulder External Rotation AAROM with Dowel - 10 reps - 1 sets - 1x daily - 7x weekly  Standing Single Arm Row with Resistance Thumb Up - 10 reps - 1 sets - 1x daily - 7x weekly  Single Arm Shoulder Extension with Resistance - 10 reps - 1 sets - 1x daily - 7x weekly  Shoulder Internal Rotation with Resistance - 10 reps - 3 sets - 1x daily - 7x weekly  Shoulder External Rotation with Anchored Resistance - 10 reps - 1 sets - 1x daily - 7x weekly  Prone Shoulder Row - 10 reps - 1 sets - 1x daily - 7x weekly  Prone Shoulder Extension - Single Arm - 10 reps - 1 sets - 1x daily - 7x weekly  Prone Shoulder Horizontal Abduction - 10 reps - 1 sets - 1x daily - 7x weekly  Prone Single Arm Shoulder Y - 10 reps - 1 sets - 1x daily - 7x weekly  Standing Shoulder Flexion to 90 Degrees with Dumbbells - 10 reps - 1 sets - 1x daily - 7x weekly  Scaption with Dumbbells - 10 reps - 1 sets - 1x daily - 7x weekly  Shoulder Abduction with Dumbbells - Thumbs Up - 10 reps - 1 sets - 1x daily - 7x weekly    [facility] [address], [place], [place] [address] Phone # [phone] Fax [phone]

## 2018-05-28 NOTE — Therapy (Signed)
[facility] [address] [place], [place], [address] Phone: [phone]   Fax:  [phone]  Physical Therapy Treatment  Patient Details  Name: Wanda Thornton MRN: [id number] Date of Birth: 1961-03-16 Referring Provider: Dr. Betty Martinique    Encounter Date: 05/28/2018  PT End of Session - 05/28/18 0805    Visit Number  9    Date for PT Re-Evaluation  06/11/18    PT Start Time  0805    PT Stop Time  0843    PT Time Calculation (min)  38 min    Activity Tolerance  Patient tolerated treatment well       Past Medical History:  Diagnosis Date  . Allergy   . Hypertension     Past Surgical History:  Procedure Laterality Date  . BREAST BIOPSY      There were no vitals filed for this visit.  Subjective Assessment - 05/28/18 0809    Subjective  My shoulder was just a little sore but I think it's from exercising.  Going to the gym but just doing the ones where I back back or down.  Sleeping OK.  Reaching behind the back easier.      Patient Stated Goals  get my motion back without pain;  what to do to prevent it;  exercises to do;  what is causing it?    Currently in Pain?  Yes    Pain Score  1     Pain Location  Shoulder    Pain Orientation  Left    Pain Type  Acute pain         [facility] PT Assessment - 05/28/18 0001      AROM   Left Shoulder Flexion  170 Degrees    Left Shoulder External Rotation  85 Degrees                   [facility] Adult PT Treatment/Exercise - 05/28/18 0001      Shoulder Exercises: Prone   Extension  Strengthening;Left;10 reps;Weights    Extension Weight (lbs)  1    Horizontal ABduction 1  Strengthening;Left;10 reps;Weights    Horizontal ABduction 1 Weight (lbs)  1    Other Prone Exercises  scaption thumb up 10x 1#      Shoulder Exercises: Standing   Other Standing Exercises  red band horizontal abduction 15x    Other Standing Exercises  3 way towel slides on wall  10x       Shoulder Exercises: ROM/Strengthening   Ranger  on wall L20 10x     Pushups  10 reps   single arm   Other ROM/Strengthening Exercises  UBE 2 min backwards, 2 min forward     Other ROM/Strengthening Exercises  wall backward C walk 2x 5       Shoulder Exercises: Power [employer]  20 reps   vertical and 2nd set with horizontal grip    Row Limitations  20# vertical and horizontal     Other Power Engineer, water  20# overhead pull downs standing 20x             PT Education - 05/28/18 0843    Education Details   Access Code: [email or web address]     Person(s) Educated  Patient    Methods  Explanation;Demonstration;Handout    Comprehension  Returned demonstration;Verbalized understanding       PT Short Term Goals - 05/14/18 0817      PT  SHORT TERM GOAL #1   Title  The patient will demonstrate basic concepts of postural correction to correct shoulder alignment and initial HEP    Status  Achieved      PT SHORT TERM GOAL #2   Title  The patient will report a 30% reduction in left shoulder pain with work activities and driving    Status  Achieved      PT SHORT TERM GOAL #3   Title  The patient will have improved shoulder flexion and abduction to 145 degrees needed for reaching the car door    Status  Achieved      PT SHORT TERM GOAL #4   Title  The patient will have improved internal rotation ROM to 50 degrees and decreased pain with horizontal adduction needed for turning the steering wheel    Status  Achieved        PT Long Term Goals - 04/16/18 1811      PT LONG TERM GOAL #1   Title  The patient will be independent with safe self progression of HEP    Time  8    Period  Weeks    Status  New    Target Date  06/11/18      PT LONG TERM GOAL #2   Title  The patient will report a 60% improvement in left shoulder pain with dressing, driving, reaching and work ADLS    Time  8    Period  Weeks    Status  New      PT LONG TERM GOAL #3   Title  Left shoulder  elevation to 150 degrees needed for reaching    Time  8    Period  Weeks    Status  New      PT LONG TERM GOAL #4   Title  Left glenoumeral and scapular strength to grossly 4/5 to 4+/5 needed for lifting/carrying light to medium objects    Time  8    Period  Weeks    Status  New      PT LONG TERM GOAL #5   Title  FOTO functional outcome score improved from 50% limitation to 33% indicating improved function with less pain    Time  8    Period  Weeks    Status  New            Plan - 05/28/18 0947    Clinical Impression Statement  The patient continues to progress well with shoulder ROM in all planes, glenohumeral and scapular strength and return to function.  Minimal cues needed for thumb up positioning and to activate scapular stabilizers.  Should meet remaining goals in 1-2 visits.      Rehab Potential  Good    PT Frequency  2x / week    PT Duration  8 weeks    PT Next Visit Plan   standing  Is, Ys, Ts;  UE Ranger on wall;  pulleys;  plate weights;   KT;  hold on ionto since just finished steroid; decrease frequency to once a week    PT Home Exercise Plan   Access Code: [email or web address]        Patient will benefit from skilled therapeutic intervention in order to improve the following deficits and impairments:  Pain, Postural dysfunction, Decreased activity tolerance, Decreased range of motion, Decreased strength, Impaired UE functional use  Visit Diagnosis: Acute pain of left shoulder  Stiffness of left shoulder, not elsewhere classified  Muscle  weakness (generalized)     Problem List Patient Active Problem List   Diagnosis Date Noted  . OSA on CPAP 11/12/2016  . Vitamin D deficiency 11/12/2016  . Asthma, extrinsic, moderate persistent, uncomplicated 70/34/0352  . Seasonal and perennial allergic rhinitis 11/12/2016  . Hypertension, essential, benign 11/12/2016  . BMI 36.0-36.9,adult 11/12/2016   [name], PT 05/28/18 8:47 AM Phone: [phone] Fax:  [phone] [name] 05/28/2018, 8:46 AM  [facility] [address], [place], [place], [address] Phone: [phone]   Fax:  [phone]  Name: Rahcel Shutes MRN: [id number] Date of Birth: [birth year]

## 2018-06-02 ENCOUNTER — Encounter: Payer: Self-pay | Admitting: Physical Therapy

## 2018-06-02 ENCOUNTER — Ambulatory Visit: Payer: 59 | Attending: Family Medicine | Admitting: Physical Therapy

## 2018-06-02 DIAGNOSIS — M25612 Stiffness of left shoulder, not elsewhere classified: Secondary | ICD-10-CM

## 2018-06-02 DIAGNOSIS — M6281 Muscle weakness (generalized): Secondary | ICD-10-CM

## 2018-06-02 DIAGNOSIS — M25512 Pain in left shoulder: Secondary | ICD-10-CM

## 2018-06-02 NOTE — Therapy (Signed)
[facility] [address], [place], [place], [address] Phone: [phone]   Fax:  [phone]  Physical Therapy Treatment  Patient Details  Name: Wanda Thornton MRN: [id number] Date of Birth: Feb 21, 1961 Referring Provider: Dr. Betty Martinique    Encounter Date: 06/02/2018  PT End of Session - 06/02/18 0818    Visit Number  10    Date for PT Re-Evaluation  06/11/18    PT Start Time  0808    PT Stop Time  0846    PT Time Calculation (min)  38 min    Activity Tolerance  Patient tolerated treatment well       Past Medical History:  Diagnosis Date  . Allergy   . Hypertension     Past Surgical History:  Procedure Laterality Date  . BREAST BIOPSY      There were no vitals filed for this visit.  Subjective Assessment - 06/02/18 0808    Subjective  A little sore in the shoulder but not really hurting.  My headset is not working so I'm having to pick up the phone.      Patient Stated Goals  get my motion back without pain;  what to do to prevent it;  exercises to do;  what is causing it?    Currently in Pain?  No/denies    Pain Score  0-No pain    Pain Location  Shoulder    Pain Orientation  Left                       [facility] Adult PT Treatment/Exercise - 06/02/18 0001      Shoulder Exercises: Prone   Extension  Strengthening;Left;15 reps;Weights    Extension Weight (lbs)  1    Horizontal ABduction 1  Strengthening;Left;15 reps;Weights    Horizontal ABduction 1 Weight (lbs)  1    Other Prone Exercises  scaption thumb up 15x 1#      Shoulder Exercises: Standing   Other Standing Exercises  3 ways 1# 10x each with mirror feedback     Other Standing Exercises  ball on wall A to Z       Shoulder Exercises: Pulleys   Flexion  2 minutes    Scaption  2 minutes      Shoulder Exercises: ROM/Strengthening   Ranger  on wall L22 10x     Pushups  20 reps   on counter; side step 10x   Other  ROM/Strengthening Exercises  UBE 2 min backwards, 2 min forward     Other ROM/Strengthening Exercises  yellow band 3 ways 10x      Shoulder Exercises: Power Warehouse manager Limitations  20# vertical and horizontal     Other Power Engineer, water  lat bar pull down 20# 2x 10               PT Short Term Goals - 05/14/18 0817      PT SHORT TERM GOAL #1   Title  The patient will demonstrate basic concepts of postural correction to correct shoulder alignment and initial HEP    Status  Achieved      PT SHORT TERM GOAL #2   Title  The patient will report a 30% reduction in left shoulder pain with work activities and driving    Status  Achieved      PT SHORT TERM GOAL #3   Title  The patient will have improved shoulder flexion  and abduction to 145 degrees needed for reaching the car door    Status  Achieved      PT SHORT TERM GOAL #4   Title  The patient will have improved internal rotation ROM to 50 degrees and decreased pain with horizontal adduction needed for turning the steering wheel    Status  Achieved        PT Long Term Goals - 04/16/18 1811      PT LONG TERM GOAL #1   Title  The patient will be independent with safe self progression of HEP    Time  8    Period  Weeks    Status  New    Target Date  06/11/18      PT LONG TERM GOAL #2   Title  The patient will report a 60% improvement in left shoulder pain with dressing, driving, reaching and work ADLS    Time  8    Period  Weeks    Status  New      PT LONG TERM GOAL #3   Title  Left shoulder elevation to 150 degrees needed for reaching    Time  8    Period  Weeks    Status  New      PT LONG TERM GOAL #4   Title  Left glenoumeral and scapular strength to grossly 4/5 to 4+/5 needed for lifting/carrying light to medium objects    Time  8    Period  Weeks    Status  New      PT LONG TERM GOAL #5   Title  FOTO functional outcome score improved from 50% limitation to 33% indicating improved function with less  pain    Time  8    Period  Weeks    Status  New            Plan - 06/02/18 0820    Clinical Impression Statement  The patient is progressing well with glenohumeral and scapular ROM and strength.  She reports no pain during movement but notes shoulder soreness at rest.  Minimal verbal cues for scapular muscle activation.  Muscular fatigue reported.  Check progress towards goals next visit for probable discharge from PT.      Rehab Potential  Good    PT Frequency  2x / week    PT Duration  8 weeks    PT Treatment/Interventions  ADLs/Self Care Home Management;Iontophoresis 4mg /ml Dexamethasone;Cryotherapy;Electrical Stimulation;Ultrasound;Moist Heat;Therapeutic activities;Therapeutic exercise;Patient/family education;Neuromuscular re-education;Manual techniques;Dry needling;Taping    PT Next Visit Plan   check progress toward goals for probable discharge next visit;  standing  Is, Ys, Ts;  UE Ranger on wall;  pulleys;  plate weights;   KT;  hold on ionto since just finished steroid; decrease frequency to once a week    PT Home Exercise Plan   Access Code: [email or web address]        Patient will benefit from skilled therapeutic intervention in order to improve the following deficits and impairments:  Pain, Postural dysfunction, Decreased activity tolerance, Decreased range of motion, Decreased strength, Impaired UE functional use  Visit Diagnosis: Acute pain of left shoulder  Stiffness of left shoulder, not elsewhere classified  Muscle weakness (generalized)     Problem List Patient Active Problem List   Diagnosis Date Noted  . OSA on CPAP 11/12/2016  . Vitamin D deficiency 11/12/2016  . Asthma, extrinsic, moderate persistent, uncomplicated 53/29/9242  . Seasonal and perennial allergic rhinitis 11/12/2016  . Hypertension, essential,  benign 11/12/2016  . BMI 36.0-36.9,adult 11/12/2016   [name], PT 06/02/18 8:42 AM Phone: [phone] Fax: [phone]  [name] 06/02/2018, 8:41 AM  [facility] [address], [place], [place], [address] Phone: [phone]   Fax:  [phone]  Name: Wanda Thornton MRN: [id number] Date of Birth: 06-May-1961

## 2018-06-05 ENCOUNTER — Other Ambulatory Visit: Payer: Self-pay | Admitting: Family Medicine

## 2018-06-05 DIAGNOSIS — I10 Essential (primary) hypertension: Secondary | ICD-10-CM

## 2018-06-11 ENCOUNTER — Encounter: Payer: Self-pay | Admitting: Physical Therapy

## 2018-06-11 ENCOUNTER — Ambulatory Visit: Payer: 59 | Admitting: Physical Therapy

## 2018-06-11 DIAGNOSIS — M25612 Stiffness of left shoulder, not elsewhere classified: Secondary | ICD-10-CM

## 2018-06-11 DIAGNOSIS — M25512 Pain in left shoulder: Secondary | ICD-10-CM | POA: Diagnosis not present

## 2018-06-11 DIAGNOSIS — M6281 Muscle weakness (generalized): Secondary | ICD-10-CM

## 2018-06-11 NOTE — Therapy (Signed)
[facility] [address] [place], [place], [address] Phone: [phone]   Fax:  [phone]  Physical Therapy Treatment/Discharge Summary  Patient Details  Name: Wanda Thornton MRN: [id number] Date of Birth: 10-05-1960 Referring Provider: Dr. Betty Thornton    Encounter Date: 06/11/2018  PT End of Session - 06/11/18 1042    Visit Number  11    Date for PT Re-Evaluation  06/11/18    PT Start Time  1016    PT Stop Time  1055    PT Time Calculation (min)  39 min       Past Medical History:  Diagnosis Date  . Allergy   . Hypertension     Past Surgical History:  Procedure Laterality Date  . BREAST BIOPSY      There were no vitals filed for this visit.  Subjective Assessment - 06/11/18 1020    Subjective  Shoulder has been feeling OK.  Just a little sore but not nearly like it was.  Went to the gym over the weekend.  Did fine with arm machines at the gym.      Pertinent History  Right hand trigger finger surgery 4 fingers,  Left hand bracing no surgery;  Ergo set up at work; sleeps on wedge CPAP    Patient Stated Goals  get my motion back without pain;  what to do to prevent it;  exercises to do;  what is causing it?    Currently in Pain?  No/denies    Pain Score  0-No pain    Aggravating Factors   only a twinge here and there; jarring movements         [facility] PT Assessment - 06/11/18 0001      Observation/Other Assessments   Focus on Therapeutic Outcomes (FOTO)   26% limitation       AROM   Left Shoulder Flexion  175 Degrees    Left Shoulder ABduction  178 Degrees    Left Shoulder Internal Rotation  --   T8   Left Shoulder External Rotation  88 Degrees      Strength   Left Shoulder Flexion  4+/5    Left Shoulder Extension  4+/5    Left Shoulder ABduction  4/5    Left Shoulder Internal Rotation  4/5    Left Shoulder External Rotation  4/5      Hawkins-Kennedy test   Findings  Positive      Empty Can test   Findings  Negative      Lag time at 0 degrees   Findings  Negative      Drop Arm test   Findings  Negative                   [facility] Adult PT Treatment/Exercise - 06/11/18 0001      Shoulder Exercises: Prone   Extension  Strengthening;Left;10 reps;Weights    Extension Weight (lbs)  2    Horizontal ABduction 1  Strengthening;Left;10 reps;Weights    Horizontal ABduction 1 Weight (lbs)  2    Other Prone Exercises  scaption thumb up 15x 1#      Shoulder Exercises: Standing   Other Standing Exercises  3 ways 2# 10x each with mirror feedback     Other Standing Exercises  bent rows 2# 10x      Shoulder Exercises: Pulleys   Flexion  2 minutes    Scaption  2 minutes      Shoulder  Exercises: ROM/Strengthening   UBE (Upper Arm Bike)  2 forward/2 backward    Other ROM/Strengthening Exercises  ball circles clockwise and counter clockwise 10x each way     Other ROM/Strengthening Exercises  yellow band 3 ways 10x               PT Short Term Goals - 06/11/18 1037      PT SHORT TERM GOAL #1   Title  The patient will demonstrate basic concepts of postural correction to correct shoulder alignment and initial HEP    Status  Achieved      PT SHORT TERM GOAL #2   Title  The patient will report a 30% reduction in left shoulder pain with work activities and driving    Status  Achieved      PT SHORT TERM GOAL #3   Title  The patient will have improved shoulder flexion and abduction to 145 degrees needed for reaching the car door      PT SHORT TERM GOAL #4   Title  The patient will have improved internal rotation ROM to 50 degrees and decreased pain with horizontal adduction needed for turning the steering wheel    Status  Achieved        PT Long Term Goals - 06/11/18 1036      PT LONG TERM GOAL #1   Title  The patient will be independent with safe self progression of HEP    Status  Achieved      PT LONG TERM GOAL #2   Title  The patient will  report a 60% improvement in left shoulder pain with dressing, driving, reaching and work ADLS    Baseline  00%    Status  Achieved      PT LONG TERM GOAL #3   Title  Left shoulder elevation to 150 degrees needed for reaching    Status  Achieved      PT LONG TERM GOAL #4   Title  Left glenoumeral and scapular strength to grossly 4/5 to 4+/5 needed for lifting/carrying light to medium objects    Status  Achieved      PT LONG TERM GOAL #5   Title  FOTO functional outcome score improved from 50% limitation to 33% indicating improved function with less pain    Status  Achieved            Plan - 06/11/18 1052    Clinical Impression Statement  The patient reports an overall improvement in left shoulder pain and function at 80%.  Her shoulder ROM is full and painless in all ranges.  Her glenohumeral and scapular strength is good 4 to 4+/5.  Her FOTO functional outcome score has improved from a 50% limitation to 33%.  She has met all rehab goals and is independent with a home exercise program.  Discharge from PT at this time.         Patient will benefit from skilled therapeutic intervention in order to improve the following deficits and impairments:     Visit Diagnosis: Acute pain of left shoulder  Stiffness of left shoulder, not elsewhere classified  Muscle weakness (generalized)  PHYSICAL THERAPY DISCHARGE SUMMARY  Visits from Start of Care: 11 Current functional level related to goals / functional outcomes: See clinical impression above   Remaining deficits: As above   Education / Equipment: HEP Plan: Patient agrees to discharge.  Patient goals were met. Patient is being discharged due to meeting the stated rehab goals.  ?????  Problem List Patient Active Problem List   Diagnosis Date Noted  . OSA on CPAP 11/12/2016  . Vitamin D deficiency 11/12/2016  . Asthma, extrinsic, moderate persistent, uncomplicated 00/92/3300  . Seasonal and perennial allergic  rhinitis 11/12/2016  . Hypertension, essential, benign 11/12/2016  . BMI 36.0-36.9,adult 11/12/2016   Wanda Thornton, PT 06/11/18 2:07 PM Phone: [phone] Fax: [phone]  Wanda Thornton 06/11/2018, 2:05 PM  [facility] [address] [place], [place], [address] Phone: [phone]   Fax:  [phone]  Name: Wanda Thornton MRN: [id number] Date of Birth: [birth year]

## 2018-06-11 NOTE — Therapy (Signed)
[facility] [address], [place], [place], [address] Phone: [phone]   Fax:  [phone]  Patient Details  Name: [name] MRN: [id number] Date of Birth: 1960-11-11 Referring Provider:  Martinique, Betty G, MD  Encounter Date: 06/11/2018   [name] 06/11/2018, 11:45 AM  [facility] [address], [place], [place], [address] Phone: [phone]   Fax:  [phone]

## 2018-06-17 ENCOUNTER — Other Ambulatory Visit (INDEPENDENT_AMBULATORY_CARE_PROVIDER_SITE_OTHER): Payer: 59

## 2018-06-17 ENCOUNTER — Encounter: Payer: Self-pay | Admitting: Internal Medicine

## 2018-06-17 ENCOUNTER — Ambulatory Visit (INDEPENDENT_AMBULATORY_CARE_PROVIDER_SITE_OTHER): Payer: 59 | Admitting: Internal Medicine

## 2018-06-17 VITALS — BP 122/64 | HR 91 | Ht 62.0 in | Wt 199.6 lb

## 2018-06-17 DIAGNOSIS — Z23 Encounter for immunization: Secondary | ICD-10-CM | POA: Diagnosis not present

## 2018-06-17 DIAGNOSIS — J4540 Moderate persistent asthma, uncomplicated: Secondary | ICD-10-CM | POA: Diagnosis not present

## 2018-06-17 DIAGNOSIS — Z880 Allergy status to penicillin: Secondary | ICD-10-CM

## 2018-06-17 DIAGNOSIS — L500 Allergic urticaria: Secondary | ICD-10-CM

## 2018-06-17 DIAGNOSIS — Z9989 Dependence on other enabling machines and devices: Secondary | ICD-10-CM

## 2018-06-17 DIAGNOSIS — G4733 Obstructive sleep apnea (adult) (pediatric): Secondary | ICD-10-CM | POA: Diagnosis not present

## 2018-06-17 DIAGNOSIS — T50905A Adverse effect of unspecified drugs, medicaments and biological substances, initial encounter: Secondary | ICD-10-CM

## 2018-06-17 LAB — CBC
HEMATOCRIT: 42.4 % (ref 36.0–46.0)
Hemoglobin: 14.9 g/dL (ref 12.0–15.0)
MCHC: 35 g/dL (ref 30.0–36.0)
MCV: 90.4 fl (ref 78.0–100.0)
PLATELETS: 305 10*3/uL (ref 150.0–400.0)
RBC: 4.7 Mil/uL (ref 3.87–5.11)
RDW: 14 % (ref 11.5–15.5)
WBC: 6.5 10*3/uL (ref 4.0–10.5)

## 2018-06-17 NOTE — Progress Notes (Signed)
HPI female never smoker followed for asthma, allergic rhinitis,  OSA Allergy Profile 02/13/17- total IgE 66, specific elevations for Timothy grass and Elm pollen. Eosinophils normal 0.3 K/UL [facility]PSG [facility] [place]/22/15-AHI 11.8/hour, desaturation to 82%, body weight 167 pounds  ------------------------------------------------------------------------------ 06/17/17-57 year old female never smoker followed for asthma, allergic rhinitis,  OSA CPAP auto 5-15/ FOLLOWS FOR: DME: [facility]. Pt wears CPAP nightly. DL attached.  Allergy Profile 02/13/17- total IgE 66, specific elevations for Timothy grass and Elm pollen. Eosinophils normal 0.3 K/UL Download confirms 100% compliance averaging 7 hours 46 minutes with AHI 1.6/hour. She says she sleeps "so much better" with CPAP. No more daytime sleepiness. She still feels Advair is doing a nice job of controlling asthma. No recent wheezing. She has Flonase and Claritin available if needed for spring allergy season.  06/17/2018- 57 year old female never smoker followed for asthma, allergic rhinitis,  OSA CPAP auto 5-15/ [facility] -----follow up OSA, CPAP working good, does want to change to [facility], needs new mask and filters Advair 250, Singulair, albuterol HFA, Allegra 180, Flonase She followed advice and raised head of bed with bricks-big help for her breathing at night. CPAP download 100% compliance AHI 1.0/hour.  Sleeps well with this.  No snoring. She reports little asthma since moving here from [place] 2 years ago.  Uses rescue inhaler maybe once a month. She asked about penicillin allergy.  Amoxicillin from dentist associated with hives but she had also been using a topical herbal preparation.  Discussed availability of penicillin allergy assay.  ROS-see HPI    "+" = positive  Constitutional:    weight loss, night sweats, fevers, chills, fatigue, lassitude. HEENT:    headaches, difficulty swallowing, tooth/dental problems, sore throat,   sneezing, itching, ear ache,  nasal congestion, post nasal drip, snoring CV:    chest pain, orthopnea, PND, swelling in lower extremities, anasarca,                                                      dizziness, palpitations Resp:   + shortness of breath with exertion or at rest.                productive cough,   non-productive cough, coughing up of blood.              change in color of mucus.  wheezing.   Skin:    rash or lesions. GI:  No-   heartburn, indigestion, abdominal pain, nausea, vomiting, diarrhea,                 change in bowel habits, loss of appetite GU: dysuria, change in color of urine, no urgency or frequency.   flank pain. MS:   joint pain, stiffness, decreased range of motion, back pain. Neuro-     nothing unusual Psych:  change in mood or affect.  depression or anxiety.   memory loss.  OBJ- Physical Exam  stable baseline exam. She looks comfortable. General- Alert, Oriented, Affect-appropriate, Distress- none acute, + overweight Skin- rash-none, lesions- none, excoriation- none Lymphadenopathy- none Head- atraumatic            Eyes- Gross vision intact, PERRLA, conjunctivae and secretions clear            Ears- Hearing, canals-normal            Nose- +  turbinate edema, no-Septal dev, mucus, polyps, erosion, perforation             Throat- Mallampati III-IV , mucosa clear , drainage- none, tonsils- atrophic Neck- flexible , trachea midline, no stridor , thyroid nl, carotid no bruit Chest - symmetrical excursion , unlabored           Heart/CV- RRR , no murmur , no gallop  , no rub, nl s1 s2                           - JVD- none , edema- none, stasis changes- none, varices- none           Lung- clear to P&A, wheeze- none, cough- none , dullness-none, rub- none           Chest wall-  Abd-  Br/ Gen/ Rectal- Not done, not indicated Extrem- cyanosis- none, clubbing, none, atrophy- none, strength- nl Neuro- grossly intact to observation

## 2018-06-17 NOTE — Patient Instructions (Signed)
Order- flu vax- standard  Order- lab- CBC w diff, Penicillin allergy assay- major and minor, also amoxacillin   Dx penicillin allergy  Order- DME [facility]-  Please replace mask of choice, filters/ supplies, continue auto 5-15, humidifier, AirView

## 2018-06-18 DIAGNOSIS — T50905A Adverse effect of unspecified drugs, medicaments and biological substances, initial encounter: Secondary | ICD-10-CM | POA: Insufficient documentation

## 2018-06-18 DIAGNOSIS — L500 Allergic urticaria: Secondary | ICD-10-CM | POA: Insufficient documentation

## 2018-06-18 NOTE — Assessment & Plan Note (Signed)
She had urticaria after 2 doses of amoxicillin for dental work, but was using a topical herbal skin product at the same time. Plan-blood test for penicillin/amoxicillin antibodies

## 2018-06-18 NOTE — Assessment & Plan Note (Signed)
Filly benefiting from CPAP with no associated problems, improved sleep and not snoring. Plan-continue CPAP auto 5-15, replace mask and filters

## 2018-06-18 NOTE — Assessment & Plan Note (Signed)
Mild intermittent well-controlled uncomplicated.  Significant improvement since she moved to [place] from [place].

## 2018-06-25 ENCOUNTER — Telehealth: Payer: Self-pay | Admitting: Internal Medicine

## 2018-06-25 LAB — ALLERGEN PENICILLIN G IGE
CLASS: 0
PENICILLIN G IGE ALLERGEN: <0.1 kU/L

## 2018-06-25 LAB — ALLERGEN PENICILLIN V (MINOR): CLASS: 0

## 2018-06-25 LAB — ALLERGEN AMOXICILLIN
Amoxicillin IgE Class: 0
Amoxicillin IgE kU/L: <0.1 kU/L

## 2018-06-25 NOTE — Telephone Encounter (Signed)
Notes recorded by Deneise Lever, MD on 06/25/2018 at 8:56 AM EDT Lab- Penicillin and amoxacillin allergy antibodies measured at zero. There is very little chance of significant allergic reaction to penicillins or related antibiotics.  Pt is aware of results and voiced her understanding.  Nothing further is needed.

## 2018-07-01 ENCOUNTER — Ambulatory Visit
Admission: RE | Admit: 2018-07-01 | Discharge: 2018-07-01 | Disposition: A | Discharge status: Home / Self Care | Payer: 59 | Source: Ambulatory Visit | Attending: Family Medicine | Admitting: Family Medicine

## 2018-07-01 DIAGNOSIS — Z1231 Encounter for screening mammogram for malignant neoplasm of breast: Secondary | ICD-10-CM

## 2018-07-25 ENCOUNTER — Other Ambulatory Visit: Payer: Self-pay | Admitting: Family Medicine

## 2018-07-25 DIAGNOSIS — J4540 Moderate persistent asthma, uncomplicated: Secondary | ICD-10-CM

## 2018-09-28 ENCOUNTER — Other Ambulatory Visit: Payer: Self-pay | Admitting: Family Medicine

## 2018-11-01 ENCOUNTER — Other Ambulatory Visit: Payer: Self-pay | Admitting: Internal Medicine

## 2018-11-01 DIAGNOSIS — J4540 Moderate persistent asthma, uncomplicated: Secondary | ICD-10-CM

## 2018-12-15 ENCOUNTER — Encounter: Payer: Self-pay | Admitting: Internal Medicine

## 2018-12-16 ENCOUNTER — Encounter: Payer: Self-pay | Admitting: Internal Medicine

## 2018-12-16 ENCOUNTER — Ambulatory Visit (INDEPENDENT_AMBULATORY_CARE_PROVIDER_SITE_OTHER): Payer: 59 | Admitting: Internal Medicine

## 2018-12-16 ENCOUNTER — Other Ambulatory Visit: Payer: Self-pay

## 2018-12-16 VITALS — BP 128/74 | HR 100 | Ht 62.0 in | Wt 197.8 lb

## 2018-12-16 DIAGNOSIS — G4733 Obstructive sleep apnea (adult) (pediatric): Secondary | ICD-10-CM | POA: Diagnosis not present

## 2018-12-16 DIAGNOSIS — J3089 Other allergic rhinitis: Secondary | ICD-10-CM | POA: Diagnosis not present

## 2018-12-16 DIAGNOSIS — J302 Other seasonal allergic rhinitis: Secondary | ICD-10-CM | POA: Diagnosis not present

## 2018-12-16 DIAGNOSIS — J4540 Moderate persistent asthma, uncomplicated: Secondary | ICD-10-CM | POA: Diagnosis not present

## 2018-12-16 DIAGNOSIS — Z9989 Dependence on other enabling machines and devices: Secondary | ICD-10-CM

## 2018-12-16 MED ORDER — METHYLPREDNISOLONE ACETATE 80 MG/ML IJ SUSP
80.0000 mg | Freq: Once | INTRAMUSCULAR | Status: AC
  Administered 2018-12-16: 80 mg via INTRAMUSCULAR

## 2018-12-16 NOTE — Patient Instructions (Signed)
Order- depo 80     Dx seasonal and perennial allergic rhinitis  We can continue CPAP auto 5-15, mask of choice,humidifier, supplies, AirView/ card  We can continue current meds  Pease call if we can help

## 2018-12-16 NOTE — Assessment & Plan Note (Signed)
Continues to benefit from CPAP with good compliance and control. Plan- continue CPAP auto 8-15

## 2018-12-16 NOTE — Assessment & Plan Note (Signed)
Exacerbation consistent with tree pollen allergy.  Plan- flonase, antihistamines, depomedrol

## 2018-12-16 NOTE — Assessment & Plan Note (Signed)
Mild exacerbation c/w allergy season- tree pollens. Typical time of year for her. Plan continue current meds. Depo 80.

## 2018-12-16 NOTE — Progress Notes (Signed)
HPI female never smoker followed for asthma, allergic rhinitis,  OSA Allergy Profile 02/13/17- total IgE 66, specific elevations for Timothy grass and Elm pollen. Eosinophils normal 0.3 K/UL [facility], [place]/22/15-AHI 11.8/hour, desaturation to 82%, body weight 167 pounds  ------------------------------------------------------------------------------ 06/17/2018- 58 year old female never smoker followed for asthma, allergic rhinitis,  OSA CPAP auto 5-15/ [facility] -----follow up OSA, CPAP working good, does want to change to [facility], needs new mask and filters Advair 250, Singulair, albuterol HFA, Allegra 180, Flonase She followed advice and raised head of bed with bricks-big help for her breathing at night. CPAP download 100% compliance AHI 1.0/hour.  Sleeps well with this.  No snoring. She reports little asthma since moving here from [place] 2 years ago.  Uses rescue inhaler maybe once a month. She asked about penicillin allergy.  Amoxicillin from dentist associated with hives but she had also been using a topical herbal preparation.  Discussed availability of penicillin allergy assay.  12/16/2018- 58 year old female never smoker followed for asthma, allergic rhinitis,  OSA CPAP auto 5-15/ [facility] Download 100% compliance, AHI 1.0/ hr -----pt presents w/ seasonal allergies; OSA on CPAP, working well for her, uses well every night Advair 250, Singulair, albuterol HFA, Allegra 180, Flonase, Atarax, Benadryl,  Very pleased still with CPAP- no concerns. Increased itching, sneezing, mild chest tightness and wheeze. Blames pollen, which is high. Has used rescue albuterol twice in past 4 days.  ROS-see HPI    "+" = positive  Constitutional:    weight loss, night sweats, fevers, chills, fatigue, lassitude. HEENT:    headaches, difficulty swallowing, tooth/dental problems, sore throat,       + sneezing, +itching, ear ache,  +nasal congestion, post nasal drip, snoring CV:    chest pain,  orthopnea, PND, swelling in lower extremities, anasarca,                                                      dizziness, palpitations Resp:   + shortness of breath with exertion or at rest.                productive cough,   non-productive cough, coughing up of blood.              change in color of mucus.  +wheezing.   Skin:    rash or lesions. GI:  No-   heartburn, indigestion, abdominal pain, nausea, vomiting, diarrhea,                 change in bowel habits, loss of appetite GU: dysuria, change in color of urine, no urgency or frequency.   flank pain. MS:   joint pain, stiffness, decreased range of motion, back pain. Neuro-     nothing unusual Psych:  change in mood or affect.  depression or anxiety.   memory loss.  OBJ- Physical Exam  stable baseline exam. She looks comfortable. General- Alert, Oriented, Affect-appropriate, Distress- none acute, + overweight Skin- rash-none, lesions- none, excoriation- none Lymphadenopathy- none Head- atraumatic            Eyes- Gross vision intact, PERRLA, conjunctivae and secretions clear            Ears- Hearing, canals-normal            Nose- + turbinate edema, no-Septal dev, mucus, polyps, erosion, perforation  Throat- Mallampati III-IV , mucosa clear , drainage- none, tonsils- atrophic Neck- flexible , trachea midline, no stridor , thyroid nl, carotid no bruit Chest - symmetrical excursion , unlabored           Heart/CV- RRR , no murmur , no gallop  , no rub, nl s1 s2                           - JVD- none , edema- none, stasis changes- none, varices- none           Lung- clear to P&A, wheeze- none, cough- none , dullness-none, rub- none           Chest wall-  Abd-  Br/ Gen/ Rectal- Not done, not indicated Extrem- cyanosis- none, clubbing, none, atrophy- none, strength- nl Neuro- grossly intact to observation

## 2019-03-13 ENCOUNTER — Other Ambulatory Visit: Payer: Self-pay | Admitting: Family Medicine

## 2019-03-13 DIAGNOSIS — J4540 Moderate persistent asthma, uncomplicated: Secondary | ICD-10-CM

## 2019-03-16 ENCOUNTER — Telehealth: Payer: Self-pay | Admitting: Internal Medicine

## 2019-03-16 DIAGNOSIS — G4733 Obstructive sleep apnea (adult) (pediatric): Secondary | ICD-10-CM

## 2019-03-16 NOTE — Telephone Encounter (Signed)
Order- DME [facility] Romans- please replace old CPAP machine which is wearing out. Auto 5-15, mask of choice,humidifier, supplies, AirView/ card

## 2019-03-16 NOTE — Telephone Encounter (Addendum)
Order placed, left a detailed message on pt machine advising her that the CPAP order was placed to [facility].

## 2019-03-16 NOTE — Telephone Encounter (Signed)
Spoke with pt, she states she feels like her Cpap machine is going out. She has to keep cutting off and restarting it for it to work. It is making very loud noises and would like an order to be sent to [place] for a new machine. CY can we place order for New CPap? Please advise.    Patient Instructions by Deneise Lever, MD at 12/16/2018 9:00 AM Author: Deneise Lever, MD Author Type: Physician Filed: 12/16/2018 9:24 AM  Note Status: Signed Cosign: Cosign Not Required Encounter Date: 12/16/2018  Editor: Deneise Lever, MD (Physician)    Order- depo 83     Dx seasonal and perennial allergic rhinitis  We can continue CPAP auto 5-15, mask of choice,humidifier, supplies, AirView/ card  We can continue current meds  Pease call if we can help

## 2019-03-17 NOTE — Telephone Encounter (Signed)
Patient checking on the status of montelukast (SINGULAIR) 10 MG tablet refill request sent in on 03/13/2019, please advise  [place], [place] [phone] (Phone) [phone] (Fax)

## 2019-03-23 ENCOUNTER — Telehealth: Payer: Self-pay | Admitting: Internal Medicine

## 2019-03-23 NOTE — Telephone Encounter (Signed)
The order states to send to [place] and I sent to [facility] by mistake.  I sent message to [facility] asking them to disregard order  I sent a "high priority" order to [facility] & explained to them order was placed on 6/16 and I sent to another dme by mistake.  I called the pt and explained what I did and she said she appreciated my call & she understood.  Nothing further needed.

## 2019-03-23 NOTE — Telephone Encounter (Signed)
Spoke with patient. She stated that she as checking on the status of her CPAP order that was supposed to been sent to [place]. She received a call from [facility] stating that they had received the order for the CPAP. She does not want to use [facility].   PCCs, can one of you all send her order to [place]? Please advise. Thanks!

## 2019-05-12 ENCOUNTER — Telehealth: Payer: Self-pay | Admitting: Family Medicine

## 2019-05-12 ENCOUNTER — Other Ambulatory Visit: Payer: Self-pay | Admitting: *Deleted

## 2019-05-12 DIAGNOSIS — Z1231 Encounter for screening mammogram for malignant neoplasm of breast: Secondary | ICD-10-CM

## 2019-05-12 NOTE — Telephone Encounter (Signed)
Copied from [place] [phone]. Topic: Referral - Request for Referral >> May 12, 2019  2:41 PM Sheran Luz wrote: Has patient seen PCP for this complaint? Yes Referral for which specialty: Mammogram  Preferred provider/office: [facility] st office (?) Reason for referral: mammogram

## 2019-05-12 NOTE — Telephone Encounter (Signed)
Referral placed as requested.

## 2019-06-11 ENCOUNTER — Other Ambulatory Visit: Payer: Self-pay | Admitting: Family Medicine

## 2019-06-11 DIAGNOSIS — I10 Essential (primary) hypertension: Secondary | ICD-10-CM

## 2019-06-29 ENCOUNTER — Encounter: Payer: Self-pay | Admitting: Family Medicine

## 2019-06-29 ENCOUNTER — Encounter: Payer: Self-pay | Admitting: Gastroenterology

## 2019-06-29 ENCOUNTER — Ambulatory Visit (INDEPENDENT_AMBULATORY_CARE_PROVIDER_SITE_OTHER): Payer: 59 | Admitting: Family Medicine

## 2019-06-29 ENCOUNTER — Other Ambulatory Visit: Payer: Self-pay

## 2019-06-29 VITALS — BP 118/80 | HR 58 | Temp 97.3°F | Resp 16 | Ht 62.99 in | Wt 197.0 lb

## 2019-06-29 DIAGNOSIS — E785 Hyperlipidemia, unspecified: Secondary | ICD-10-CM | POA: Diagnosis not present

## 2019-06-29 DIAGNOSIS — E559 Vitamin D deficiency, unspecified: Secondary | ICD-10-CM | POA: Diagnosis not present

## 2019-06-29 DIAGNOSIS — E049 Nontoxic goiter, unspecified: Secondary | ICD-10-CM

## 2019-06-29 DIAGNOSIS — Z0000 Encounter for general adult medical examination without abnormal findings: Secondary | ICD-10-CM | POA: Diagnosis not present

## 2019-06-29 DIAGNOSIS — Z131 Encounter for screening for diabetes mellitus: Secondary | ICD-10-CM

## 2019-06-29 DIAGNOSIS — Z23 Encounter for immunization: Secondary | ICD-10-CM | POA: Diagnosis not present

## 2019-06-29 DIAGNOSIS — I10 Essential (primary) hypertension: Secondary | ICD-10-CM | POA: Diagnosis not present

## 2019-06-29 DIAGNOSIS — Z1211 Encounter for screening for malignant neoplasm of colon: Secondary | ICD-10-CM

## 2019-06-29 LAB — HEMOGLOBIN A1C: Hgb A1c MFr Bld: 4.6 % (ref 4.6–6.5)

## 2019-06-29 LAB — COMPREHENSIVE METABOLIC PANEL
ALT: 18 U/L (ref 0–35)
AST: 17 U/L (ref 0–37)
Albumin: 4.4 g/dL (ref 3.5–5.2)
Alkaline Phosphatase: 100 U/L (ref 39–117)
BUN: 10 mg/dL (ref 6–23)
CO2: 28 mEq/L (ref 19–32)
Calcium: 9.7 mg/dL (ref 8.4–10.5)
Chloride: 104 mEq/L (ref 96–112)
Creatinine, Ser: 0.66 mg/dL (ref 0.40–1.20)
GFR: 112.17 mL/min (ref >60.00)
Glucose, Bld: 87 mg/dL (ref 70–99)
Potassium: 3.6 mEq/L (ref 3.5–5.1)
Sodium: 142 mEq/L (ref 135–145)
Total Bilirubin: 1.2 mg/dL (ref 0.2–1.2)
Total Protein: 7.1 g/dL (ref 6.0–8.3)

## 2019-06-29 LAB — LIPID PANEL
Cholesterol: 171 mg/dL (ref 0–200)
HDL: 43.7 mg/dL (ref >39.00)
LDL Cholesterol: 110 mg/dL — ABNORMAL HIGH (ref 0–99)
NonHDL: 127.28
Total CHOL/HDL Ratio: 4
Triglycerides: 86 mg/dL (ref 0.0–149.0)
VLDL: 17.2 mg/dL (ref 0.0–40.0)

## 2019-06-29 LAB — VITAMIN D 25 HYDROXY (VIT D DEFICIENCY, FRACTURES): VITD: 43.74 ng/mL (ref 30.00–100.00)

## 2019-06-29 LAB — TSH: TSH: 1.4 u[IU]/mL (ref 0.35–4.50)

## 2019-06-29 MED ORDER — AMLODIPINE BESYLATE 10 MG PO TABS: 10.0000 mg | ORAL_TABLET | Freq: Every day | ORAL | 3 refills | Status: DC

## 2019-06-29 NOTE — Patient Instructions (Addendum)
Today you have you routine preventive visit.  Routine general medical examination at a health care facility  Hypertension, essential, benign - Plan: TSH, amLODipine (NORVASC) 10 MG tablet  Diabetes mellitus screening - Plan: Comprehensive metabolic panel, Hemoglobin A1c  Dyslipidemia (high LDL; low HDL) - Plan: Lipid panel  Enlarged thyroid gland - Plan: TSH, US THYROID    At least 150 minutes of moderate exercise per week, daily brisk walking for 15-30 min is a good exercise option. Healthy diet low in saturated (animal) fats and sweets and consisting of fresh fruits and vegetables, lean meats such as fish and white chicken and whole grains.  These are some of recommendations for screening depending of age and risk factors:   - Vaccines:  Tdap vaccine every 10 years.  Shingles vaccine recommended at age 70, could be given after 56 years of age but not sure about insurance coverage.   Pneumonia vaccines:  Prevnar 13 at 65 and Pneumovax at 25. Sometimes Pneumovax is giving earlier if history of smoking, lung disease,diabetes,kidney disease among some.    Screening for diabetes at age 67 and every 3 years.  Cervical cancer prevention:  Status post hysterectomy.  -Breast cancer: Mammogram: There is disagreement between experts about when to start screening in low risk asymptomatic female but recent recommendations are to start screening at 13 and not later than 56 years old , every 1-2 years and after 56 yo q 2 years. Screening is recommended until 56 years old but some women can continue screening depending of healthy issues.   Colon cancer screening: starts at 56 years old until 56 years old.  Also recommended:  1. Dental visit- Brush and floss your teeth twice daily; visit your dentist twice a year. 2. Eye doctor- Get an eye exam at least every 2 years. 3. Helmet use- Always wear a helmet when riding a bicycle, motorcycle, rollerblading or skateboarding. 4. Safe sex- If  you may be exposed to sexually transmitted infections, use a condom. 5. Seat belts- Seat belts can save your live; always wear one. 6. Smoke/Carbon Monoxide detectors- These detectors need to be installed on the appropriate level of your home. Replace batteries at least once a year. 7. Skin cancer- When out in the sun please cover up and use sunscreen 15 SPF or higher. 8. Violence- If anyone is threatening or hurting you, please tell your healthcare provider.  9. Drink alcohol in moderation- Limit alcohol intake to one drink or less per day. Never drink and drive.

## 2019-06-29 NOTE — Progress Notes (Signed)
HPI:   Wanda Thornton is a 58 y.o. female, who is here today for her routine physical.  Last CPE: 03/11/2018.  Regular exercise 3 or more time per week: Walking daily 1-2 miles. Following a healthful diet: Yes. She lives with her husband.  Chronic medical problems: HTN,dyslipidemia, allergies,OSA on CPAP (follows with Dr Annamaria Boots).  Pap smear 05/2017. Hx of abnormal pap smears: Negative.   Immunization History  Administered Date(s) Administered  . Influenza,inj,Quad PF,6+ Mos 10/16/1998, 06/17/2017, 06/17/2018, 06/29/2019  . Pneumococcal Polysaccharide-23 11/07/2015  . Tdap 09/15/2009    Mammogram: 06/2018. She has an appt 07/03/19. Colonoscopy: She has not had one.  DEXA: N/A  Hep C screening: 02/2018.  She has no concerns today.  Vitamin D deficiency: Currently she is on OTC vitamin D 2000 units daily.  Hypertension: She discontinued HCTZ. Currently she is on amlodipine 10 mg daily.  Lab Results  Component Value Date   CREATININE 0.71 03/11/2018   BUN 12 03/11/2018   NA 142 03/11/2018   K 3.5 05/15/2018   CL 103 03/11/2018   CO2 30 03/11/2018    Dyslipidemia: She follows a low-fat diet.  Lab Results  Component Value Date   CHOL 170 03/11/2018   HDL 36.50 (L) 03/11/2018   LDLCALC 117 (H) 03/11/2018   TRIG 83.0 03/11/2018   CHOLHDL 5 03/11/2018    Review of Systems  Constitutional: Negative for appetite change, fatigue and fever.  HENT: Negative for dental problem, hearing loss, mouth sores, sore throat, trouble swallowing and voice change.   Eyes: Negative for redness and visual disturbance.  Respiratory: Negative for cough, shortness of breath and wheezing.   Cardiovascular: Negative for chest pain and leg swelling.  Gastrointestinal: Negative for abdominal pain, nausea and vomiting.       No changes in bowel habits.  Endocrine: Negative for cold intolerance, heat intolerance, polydipsia, polyphagia and polyuria.  Genitourinary:  Negative for decreased urine volume, dysuria, hematuria, vaginal bleeding and vaginal discharge.  Musculoskeletal: Negative for arthralgias, gait problem and myalgias.  Skin: Negative for color change and rash.  Allergic/Immunologic: Negative for environmental allergies.  Neurological: Negative for syncope, weakness and headaches.  Hematological: Negative for adenopathy. Does not bruise/bleed easily.  Psychiatric/Behavioral: Negative for confusion and sleep disturbance. The patient is not nervous/anxious.   All other systems reviewed and are negative.   Current Outpatient Medications on File Prior to Visit  Medication Sig Dispense Refill  . ADVAIR DISKUS 250-50 MCG/DOSE AEPB USE 1 INHALATION TWICE A DAY 180 each 4  . B Complex-C (B-COMPLEX WITH VITAMIN C) tablet Take 1 tablet by mouth daily.    Marland Kitchen CALCIUM CITRATE PO Take 2 tablets by mouth 2 (two) times daily.    . cholecalciferol (VITAMIN D) 1000 units tablet Take 2,000 Units by mouth daily.     Marland Kitchen CRANBERRY PO Take 1 tablet by mouth daily.    . fexofenadine (ALLEGRA) 180 MG tablet Take 180 mg by mouth daily.    . fluticasone (FLONASE) 50 MCG/ACT nasal spray USE 2 SPRAYS IN EACH NOSTRIL DAILY AS NEEDED FOR ALLERGIES 48 g 4  . Lactase (LACTOSE INTOLERANCE PO) Take by mouth. Before eating each meal    . montelukast (SINGULAIR) 10 MG tablet TAKE 1 TABLET AT BEDTIME 90 tablet 3  . potassium chloride (K-DUR,KLOR-CON) 10 MEQ tablet TAKE 1 TABLET TWICE A DAY 180 tablet 4  . PROAIR HFA 108 (90 Base) MCG/ACT inhaler USE 2 INHALATIONS EVERY 6 HOURS AS NEEDED  FOR WHEEZING OR SHORTNESS OF BREATH 25.5 g 3  . diphenhydrAMINE (BENADRYL) 25 MG tablet Take 1 tablet (25 mg total) by mouth every 6 (six) hours for 1 day. 4 tablet 0  . famotidine (PEPCID) 20 MG tablet Take 1 tablet (20 mg total) by mouth 2 (two) times daily for 3 days. 6 tablet 0  . hydrOXYzine (ATARAX/VISTARIL) 25 MG tablet Take 1-2 tablets (25-50 mg total) by mouth every 6 (six) hours as needed  for itching. (Patient not taking: Reported on 06/29/2019) 20 tablet 0   No current facility-administered medications on file prior to visit.      Past Medical History:  Diagnosis Date  . Allergy   . Hypertension     Past Surgical History:  Procedure Laterality Date  . BREAST BIOPSY Right     Allergies  Allergen Reactions  . Amoxicillin Hives  . Bactrim [Sulfamethoxazole-Trimethoprim] Swelling    Anaphylaxis   . Clindamycin/Lincomycin Swelling    Anaphylaxis   . Keflex [Cephalexin] Swelling    Anaphylaxis   . Hydrocodone     Nausea vomiting  . Lisinopril     headache    Family History  Problem Relation Age of Onset  . Hyperlipidemia Mother   . Hypertension Mother   . Hypertension Father   . Hyperlipidemia Father   . Diabetes Sister   . Cancer Sister        breast  . Breast cancer Sister   . Diabetes Sister   . Cancer Sister        breast  . Breast cancer Sister   . Breast cancer Maternal Aunt     Social History   Socioeconomic History  . Marital status: Married    Spouse name: Not on file  . Number of children: Not on file  . Years of education: Not on file  . Highest education level: Not on file  Occupational History  . Not on file  Social Needs  . Financial resource strain: Not on file  . Food insecurity    Worry: Not on file    Inability: Not on file  . Transportation needs    Medical: Not on file    Non-medical: Not on file  Tobacco Use  . Smoking status: Never Smoker  . Smokeless tobacco: Never Used  Substance and Sexual Activity  . Alcohol use: No  . Drug use: No  . Sexual activity: Yes  Lifestyle  . Physical activity    Days per week: Not on file    Minutes per session: Not on file  . Stress: Not on file  Relationships  . Social Herbalist on phone: Not on file    Gets together: Not on file    Attends religious service: Not on file    Active member of club or organization: Not on file    Attends meetings of clubs or  organizations: Not on file    Relationship status: Not on file  Other Topics Concern  . Not on file  Social History Narrative  . Not on file    Vitals:   06/29/19 0833  BP: 118/80  Pulse: (!) 58  Resp: 16  Temp: (!) 97.3 F (36.3 C)  SpO2: 98%   Body mass index is 34.91 kg/m.   Wt Readings from Last 3 Encounters:  06/29/19 197 lb (89.4 kg)  12/16/18 197 lb 12.8 oz (89.7 kg)  06/17/18 199 lb 9.6 oz (90.5 kg)     Physical Exam  Nursing note and vitals reviewed. Constitutional: She is oriented to person, place, and time. She appears well-developed. No distress.  HENT:  Head: Normocephalic and atraumatic.  Right Ear: Hearing, tympanic membrane, external ear and ear canal normal.  Left Ear: Hearing, tympanic membrane, external ear and ear canal normal.  Mouth/Throat: Uvula is midline, oropharynx is clear and moist and mucous membranes are normal.  Eyes: Pupils are equal, round, and reactive to light. Conjunctivae and EOM are normal.  Neck: No tracheal deviation present. ? thyromegaly present, right . Cardiovascular: Normal rate and regular rhythm. HR by my count 60/min. No murmur heard. Pulses:      Dorsalis pedis pulses are 2+ on the right side, and 2+ on the left side.  Respiratory: Effort normal and breath sounds normal. No respiratory distress.  GI: Soft. She exhibits no mass. There is no hepatomegaly. There is no tenderness.  Genitourinary:Comments: Breast: No masses,skin abnormalities,or nipple discharge. Musculoskeletal: She exhibits no edema.  No major deformity or signs of synovitis appreciated.  Lymphadenopathy:    She has no cervical or axillary adenopathy.       Right: No supraclavicular adenopathy present.       Left: No supraclavicular adenopathy present.  Neurological: She is alert and oriented to person, place, and time. She has normal strength. No cranial nerve deficit. Coordination and gait normal.  Reflex Scores:      Bicep reflexes are 2+ on the  right side and 2+ on the left side.      Patellar reflexes are 2+ on the right side and 2+ on the left side. Skin: Skin is warm. No rash noted. No erythema.  Psychiatric: She has a normal mood and affect. Cognitive function grossly intact. Well groomed, good eye contact.   ASSESSMENT AND PLAN:  Ms. Haby Rourke was here today annual physical examination.   Orders Placed This Encounter  Procedures  . US THYROID  . Flu Vaccine QUAD 36+ mos IM  . Lipid panel  . Comprehensive metabolic panel  . TSH  . Hemoglobin A1c  . VITAMIN D 25 Hydroxy (Vit-D Deficiency, Fractures)  . Ambulatory referral to Gastroenterology   Lab Results  Component Value Date   ALT 18 06/29/2019   AST 17 06/29/2019   ALKPHOS 100 06/29/2019   BILITOT 1.2 06/29/2019   Lab Results  Component Value Date   CREATININE 0.66 06/29/2019   BUN 10 06/29/2019   NA 142 06/29/2019   K 3.6 06/29/2019   CL 104 06/29/2019   CO2 28 06/29/2019   Lab Results  Component Value Date   CHOL 171 06/29/2019   HDL 43.70 06/29/2019   LDLCALC 110 (H) 06/29/2019   TRIG 86.0 06/29/2019   CHOLHDL 4 06/29/2019   Lab Results  Component Value Date   TSH 1.40 06/29/2019    Routine general medical examination at a health care facility We discussed the importance of regular physical activity and healthy diet for prevention of chronic illness and/or complications. Preventive guidelines reviewed. Vaccination up to date.  Ca++ and vit D supplementation recommended. Next CPE in a year.  The 10-year ASCVD risk score Mikey Bussing DC Jr., et al., 2013) is: 4%   Values used to calculate the score:     Age: 59 years     Sex: Female     Is Non-Hispanic African American: Yes     Diabetic: No     Tobacco smoker: No     Systolic Blood Pressure: 123456 mmHg  Is BP treated: Yes     HDL Cholesterol: 43.7 mg/dL     Total Cholesterol: 171 mg/dL   Hypertension, essential, benign BP adequately controlled with Amlodipine 10 mg  daily,no changes. Recommend monitoring BP regularly. Continue low salt diet. She would like to continue following annually.  -     TSH -     amLODipine (NORVASC) 10 MG tablet; Take 1 tablet (10 mg total) by mouth daily.  Diabetes mellitus screening -     Comprehensive metabolic panel -     Hemoglobin A1c  Dyslipidemia (high LDL; low HDL) Low fat diet to continue. Further recommendations will be given according to 10 years CV risk score/lipid panel results.  -     Lipid panel  Enlarged thyroid gland ? Thyroid nodule. Further recommendations will be given according to labs/imaging results.  -     TSH -     US THYROID; Future  Colon cancer screening -     Ambulatory referral to Gastroenterology  Vitamin D deficiency, unspecified No changes in current management, will follow labs done today and will give further recommendations accordingly.  -     VITAMIN D 25 Hydroxy (Vit-D Deficiency, Fractures)  Other orders -     Flu Vaccine QUAD 36+ mos IM    Return in 1 year (on 06/28/2020) for cpe and f/u.    Delbra Zellars G. [place], MD  [facility]. [place] office.

## 2019-06-30 ENCOUNTER — Encounter: Payer: Self-pay | Admitting: Family Medicine

## 2019-07-05 ENCOUNTER — Other Ambulatory Visit: Payer: Self-pay

## 2019-07-05 ENCOUNTER — Ambulatory Visit
Admission: RE | Admit: 2019-07-05 | Discharge: 2019-07-05 | Disposition: A | Discharge status: Home / Self Care | Payer: 59 | Source: Ambulatory Visit | Attending: Family Medicine | Admitting: Family Medicine

## 2019-07-05 DIAGNOSIS — Z1231 Encounter for screening mammogram for malignant neoplasm of breast: Secondary | ICD-10-CM

## 2019-07-07 ENCOUNTER — Ambulatory Visit
Admission: RE | Admit: 2019-07-07 | Discharge: 2019-07-07 | Disposition: A | Discharge status: Home / Self Care | Payer: 59 | Source: Ambulatory Visit | Attending: Family Medicine | Admitting: Family Medicine

## 2019-07-07 DIAGNOSIS — E049 Nontoxic goiter, unspecified: Secondary | ICD-10-CM

## 2019-07-12 ENCOUNTER — Telehealth: Payer: Self-pay | Admitting: Family Medicine

## 2019-07-12 NOTE — Telephone Encounter (Signed)
The patient received her summary for her recent ultrasound and the summary told her to talk to her provider about her medications.  She would like a return call to discuss them.

## 2019-07-12 NOTE — Telephone Encounter (Signed)
Message sent to Dr. Jordan for review. 

## 2019-07-13 ENCOUNTER — Other Ambulatory Visit: Payer: Self-pay | Admitting: *Deleted

## 2019-07-13 DIAGNOSIS — E049 Nontoxic goiter, unspecified: Secondary | ICD-10-CM

## 2019-07-13 NOTE — Telephone Encounter (Signed)
Patient given results and recommendations and verbalized understanding. Referral placed for endocrinology.

## 2019-07-13 NOTE — Telephone Encounter (Signed)
Patient is requesting pcp or nurse to give a call back regarding her results and her Bx  Call back [phone]

## 2019-07-13 NOTE — Telephone Encounter (Signed)
Thyroid US already reviewed. Left thyroid nodule, recommend to consider Bx, so endocrinology evaluation recommended.  Thanks, BJ

## 2019-07-16 ENCOUNTER — Telehealth: Payer: Self-pay | Admitting: *Deleted

## 2019-07-16 NOTE — Telephone Encounter (Signed)
Copied from [place] [phone]. Topic: General - Other >> Jul 16, 2019  9:55 AM Antonieta Iba C wrote: Reason for CRM: pt called in to follow up. Pt says that she was told by her PCP that someone would be calling her to schedule to have a biopsy. Pt hasn't received a call, please assist.

## 2019-07-16 NOTE — Telephone Encounter (Signed)
Patient given information to [facility] where referral was sent. Nothing further needed at this time.

## 2019-07-26 ENCOUNTER — Other Ambulatory Visit: Payer: Self-pay

## 2019-07-26 ENCOUNTER — Encounter: Payer: Self-pay | Admitting: Gastroenterology

## 2019-07-26 ENCOUNTER — Ambulatory Visit (AMBULATORY_SURGERY_CENTER): Payer: Self-pay | Admitting: *Deleted

## 2019-07-26 VITALS — Temp 97.6°F | Ht 63.0 in | Wt 197.0 lb

## 2019-07-26 DIAGNOSIS — Z1211 Encounter for screening for malignant neoplasm of colon: Secondary | ICD-10-CM

## 2019-07-26 DIAGNOSIS — Z1159 Encounter for screening for other viral diseases: Secondary | ICD-10-CM

## 2019-07-26 MED ORDER — NA SULFATE-K SULFATE-MG SULF 17.5-3.13-1.6 GM/177ML PO SOLN: 1.0000 | Freq: Once | ORAL | 0 refills | Status: AC

## 2019-07-26 NOTE — Progress Notes (Signed)
No egg or soy allergy known to patient  No issues with past sedation with any surgeries  or procedures, no intubation problems  No diet pills per patient No home 02 use per patient  No blood thinners per patient  Pt denies issues with constipation  No A fib or A flutter  EMMI video sent to pt's e mail   Due to the COVID-19 pandemic we are asking patients to follow these guidelines. Please only bring one care partner. Please be aware that your care partner may wait in the car in the parking lot or if they feel like they will be too hot to wait in the car, they may wait in the lobby on the 4th floor. All care partners are required to wear a mask the entire time (we do not have any that we can provide them), they need to practice social distancing, and we will do a Covid check for all patient's and care partners when you arrive. Also we will check their temperature and your temperature. If the care partner waits in their car they need to stay in the parking lot the entire time and we will call them on their cell phone when the patient is ready for discharge so they can bring the car to the front of the building. Also all patient's will need to wear a mask into building.  suprep coupon provided.  covid screening 08/04/19, 8:50

## 2019-08-04 ENCOUNTER — Other Ambulatory Visit: Payer: Self-pay | Admitting: Gastroenterology

## 2019-08-04 LAB — SARS CORONAVIRUS 2 (TAT 6-24 HRS): SARS Coronavirus 2: NEGATIVE

## 2019-08-09 ENCOUNTER — Other Ambulatory Visit: Payer: Self-pay

## 2019-08-09 ENCOUNTER — Ambulatory Visit (AMBULATORY_SURGERY_CENTER): Payer: 59 | Admitting: Gastroenterology

## 2019-08-09 ENCOUNTER — Encounter: Payer: Self-pay | Admitting: Gastroenterology

## 2019-08-09 VITALS — BP 134/70 | HR 82 | Temp 98.9°F | Resp 20 | Ht 63.0 in | Wt 197.0 lb

## 2019-08-09 DIAGNOSIS — D122 Benign neoplasm of ascending colon: Secondary | ICD-10-CM

## 2019-08-09 DIAGNOSIS — K635 Polyp of colon: Secondary | ICD-10-CM | POA: Diagnosis not present

## 2019-08-09 DIAGNOSIS — Z1211 Encounter for screening for malignant neoplasm of colon: Secondary | ICD-10-CM

## 2019-08-09 MED ORDER — SODIUM CHLORIDE 0.9 % IV SOLN: 500.0000 mL | Freq: Once | INTRAVENOUS | Status: DC

## 2019-08-09 NOTE — Op Note (Signed)
[facility] Patient Name: Wanda Thornton Procedure Date: 08/09/2019 10:06 AM MRN: [id number] Endoscopist: Jackquline Denmark , MD Age: 58 Referring MD:  Date of Birth: May 12, 1961 Gender: Female Account #: [id number] Procedure:                Colonoscopy Indications:              Screening for colorectal malignant neoplasm Medicines:                Monitored Anesthesia Care Procedure:                Pre-Anesthesia Assessment:                           - Prior to the procedure, a History and Physical                            was performed, and patient medications and                            allergies were reviewed. The patient's tolerance of                            previous anesthesia was also reviewed. The risks                            and benefits of the procedure and the sedation                            options and risks were discussed with the patient.                            All questions were answered, and informed consent                            was obtained. Prior Anticoagulants: The patient has                            taken no previous anticoagulant or antiplatelet                            agents. ASA Grade Assessment: II - A patient with                            mild systemic disease. After reviewing the risks                            and benefits, the patient was deemed in                            satisfactory condition to undergo the procedure.                           After obtaining informed consent, the colonoscope  was passed under direct vision. Throughout the                            procedure, the patient's blood pressure, pulse, and                            oxygen saturations were monitored continuously. The                            Colonoscope was introduced through the anus and                            advanced to the 2 cm into the ileum. The                            colonoscopy was performed  without difficulty. The                            patient tolerated the procedure well. The quality                            of the bowel preparation was good. The terminal                            ileum, ileocecal valve, appendiceal orifice, and                            rectum were photographed. Scope In: 10:13:21 AM Scope Out: 10:27:40 AM Scope Withdrawal Time: 0 hours 10 minutes 9 seconds  Total Procedure Duration: 0 hours 14 minutes 19 seconds  Findings:                 A 4 mm polyp was found in the proximal ascending                            colon. The polyp was sessile. The polyp was removed                            with a cold biopsy forceps. Resection and retrieval                            were complete.                           A few small-mouthed diverticula were found in the                            sigmoid colon, transverse colon and ascending colon.                           Non-bleeding internal hemorrhoids were found during                            retroflexion. The hemorrhoids were small.  The terminal ileum appeared normal.                           The exam was otherwise without abnormality on                            direct and retroflexion views. Complications:            No immediate complications. Estimated Blood Loss:     Estimated blood loss: none. Impression:               - One 4 mm polyp in the proximal ascending colon,                            removed with a cold biopsy forceps. Resected and                            retrieved.                           - Very mild pancolonic diverticulosis.                           - Non-bleeding internal hemorrhoids.                           - Otherwise normal colonoscopy to TI. Recommendation:           - Patient has a contact number available for                            emergencies. The signs and symptoms of potential                            delayed complications  were discussed with the                            patient. Return to normal activities tomorrow.                            Written discharge instructions were provided to the                            patient.                           - Resume previous diet.                           - Continue present medications.                           - Await pathology results.                           - Repeat colonoscopy for surveillance based on  pathology results.                           - Return to GI clinic PRN.                           - The findings and recommendations were discussed                            with the patient's family. Jackquline Denmark, MD 08/09/2019 10:33:46 AM This report has been signed electronically.

## 2019-08-09 NOTE — Progress Notes (Signed)
Called to room to assist during endoscopic procedure.  Patient ID and intended procedure confirmed with present staff. Received instructions for my participation in the procedure from the performing physician.  

## 2019-08-09 NOTE — Patient Instructions (Signed)
HANDOUTS GIVEN: POLYPS,DIVERTICULOSIS AND HEMORRHOIDS.   YOU HAD AN ENDOSCOPIC PROCEDURE TODAY AT [facility]:   Refer to the procedure report that was given to you for any specific questions about what was found during the examination.  If the procedure report does not answer your questions, please call your gastroenterologist to clarify.  If you requested that your care partner not be given the details of your procedure findings, then the procedure report has been included in a sealed envelope for you to review at your convenience later.  YOU SHOULD EXPECT: Some feelings of bloating in the abdomen. Passage of more gas than usual.  Walking can help get rid of the air that was put into your GI tract during the procedure and reduce the bloating. If you had a lower endoscopy (such as a colonoscopy or flexible sigmoidoscopy) you may notice spotting of blood in your stool or on the toilet paper. If you underwent a bowel prep for your procedure, you may not have a normal bowel movement for a few days.  Please Note:  You might notice some irritation and congestion in your nose or some drainage.  This is from the oxygen used during your procedure.  There is no need for concern and it should clear up in a day or so.  SYMPTOMS TO REPORT IMMEDIATELY:   Following lower endoscopy (colonoscopy or flexible sigmoidoscopy):  Excessive amounts of blood in the stool  Significant tenderness or worsening of abdominal pains  Swelling of the abdomen that is new, acute  Fever of 100F or higher   Following upper endoscopy (EGD)  Vomiting of blood or coffee ground material  New chest pain or pain under the shoulder blades  Painful or persistently difficult swallowing  New shortness of breath  Fever of 100F or higher  Black, tarry-looking stools  For urgent or emergent issues, a gastroenterologist can be reached at any hour by calling [phone].   DIET:  We do recommend a small meal at  first, but then you may proceed to your regular diet.  Drink plenty of fluids but you should avoid alcoholic beverages for 24 hours.  ACTIVITY:  You should plan to take it easy for the rest of today and you should NOT DRIVE or use heavy machinery until tomorrow (because of the sedation medicines used during the test).    FOLLOW UP: Our staff will call the number listed on your records 48-72 hours following your procedure to check on you and address any questions or concerns that you may have regarding the information given to you following your procedure. If we do not reach you, we will leave a message.  We will attempt to reach you two times.  During this call, we will ask if you have developed any symptoms of COVID 19. If you develop any symptoms (ie: fever, flu-like symptoms, shortness of breath, cough etc.) before then, please call [phone].  If you test positive for Covid 19 in the 2 weeks post procedure, please call and report this information to [place].    If any biopsies were taken you will be contacted by phone or by letter within the next 1-3 weeks.  Please call us at [phone] if you have not heard about the biopsies in 3 weeks.    SIGNATURES/CONFIDENTIALITY: You and/or your care partner have signed paperwork which will be entered into your electronic medical record.  These signatures attest to the fact that that the information above on your After  Visit Summary has been reviewed and is understood.  Full responsibility of the confidentiality of this discharge information lies with you and/or your care-partner. 

## 2019-08-09 NOTE — Progress Notes (Signed)
To PACU VSS. Report to [email or web address] 

## 2019-08-09 NOTE — Progress Notes (Signed)
Pt's states no medical or surgical changes since previsit or office visit.  CW vitals, SB IV and LC temps.

## 2019-08-11 ENCOUNTER — Telehealth: Payer: Self-pay

## 2019-08-11 ENCOUNTER — Other Ambulatory Visit: Payer: Self-pay

## 2019-08-11 NOTE — Telephone Encounter (Signed)
1st follow up call made.  NALM 

## 2019-08-11 NOTE — Telephone Encounter (Signed)
  Follow up Call-  Call back number 08/09/2019  Post procedure Call Back phone  # [phone]  Permission to leave phone message Yes  Some recent data might be hidden     Patient questions:  Do you have a fever, pain , or abdominal swelling? no Pain Score  0 *  Have you tolerated food without any problems? Yes.    Have you been able to return to your normal activities? Yes.    Do you have any questions about your discharge instructions: Diet   No. Medications  No. Follow up visit  No.  Do you have questions or concerns about your Care? No.  Actions: * If pain score is 4 or above: No action needed, pain <4.  1. Have you developed a fever since your procedure? no  2.   Have you had an respiratory symptoms (SOB or cough) since your procedure? no  3.   Have you tested positive for COVID 19 since your procedure no  4.   Have you had any family members/close contacts diagnosed with the COVID 19 since your procedure?  no   If yes to any of these questions please route to Joylene John, RN and Alphonsa Gin, Therapist, sports.

## 2019-08-11 NOTE — Progress Notes (Signed)
Name: Wanda Thornton  MRN/ DOB: [id number], Jan 10, 1961    Age/ Sex: 58 y.o., female    PCP: Martinique, Betty G, MD   Reason for Endocrinology Evaluation: Thyromegaly     Date of Initial Endocrinology Evaluation: 08/13/2019     HPI: [name] is a 58 y.o. female with a past medical history of HTN, OSA on CPAP and dyslipidemia. The patient presented for initial endocrinology clinic visit on 08/13/2019 for consultative assistance with her Thyromegaly   Pt noted to have thyromegaly during an examination in 06/2019.  Denies any pain, dysphagia  Denies radiation exposure   Denies weight changes, fatigue Has occasional constipation  Denies depression   NO FH of thyroid disease  HISTORY:  Past Medical History:  Past Medical History:  Diagnosis Date  . Allergy   . Arthritis    fingers  . Asthma   . Hypertension   . Sleep apnea    cpap   Past Surgical History:  Past Surgical History:  Procedure Laterality Date  . BREAST BIOPSY Right   . PARTIAL HYSTERECTOMY  2005  . TRIGGER FINGER RELEASE    . wisdom teeth        Social History:  reports that she has never smoked. She has never used smokeless tobacco. She reports that she does not drink alcohol or use drugs.  Family History: family history includes Breast cancer in her maternal aunt, sister, and sister; Cancer in her sister and sister; Diabetes in her sister and sister; Hyperlipidemia in her father and mother; Hypertension in her father and mother.   HOME MEDICATIONS: Allergies as of 08/13/2019      Reactions   Amoxicillin Hives   Bactrim [sulfamethoxazole-trimethoprim] Swelling   Anaphylaxis    Clindamycin/lincomycin Swelling   Anaphylaxis    Keflex [cephalexin] Swelling   Anaphylaxis    Hydrocodone    Nausea vomiting   Lisinopril    headache      Medication List       Accurate as of August 13, 2019  7:32 AM. If you have any questions, ask your nurse or doctor.        Advair  Diskus 250-50 MCG/DOSE Aepb Generic drug: Fluticasone-Salmeterol USE 1 INHALATION TWICE A DAY   amLODipine 10 MG tablet Commonly known as: NORVASC Take 1 tablet (10 mg total) by mouth daily.   B-complex with vitamin C tablet Take 1 tablet by mouth daily.   CALCIUM CITRATE PO Take 2 tablets by mouth 2 (two) times daily.   cholecalciferol 1000 units tablet Commonly known as: VITAMIN D Take 2,000 Units by mouth daily.   CRANBERRY PO Take 1 tablet by mouth daily.   diphenhydrAMINE 25 MG tablet Commonly known as: BENADRYL Take 1 tablet (25 mg total) by mouth every 6 (six) hours for 1 day.   famotidine 20 MG tablet Commonly known as: PEPCID Take 1 tablet (20 mg total) by mouth 2 (two) times daily for 3 days.   fexofenadine 180 MG tablet Commonly known as: ALLEGRA Take 180 mg by mouth daily.   fluticasone 50 MCG/ACT nasal spray Commonly known as: FLONASE USE 2 SPRAYS IN EACH NOSTRIL DAILY AS NEEDED FOR ALLERGIES   hydrOXYzine 25 MG tablet Commonly known as: ATARAX/VISTARIL Take 1-2 tablets (25-50 mg total) by mouth every 6 (six) hours as needed for itching.   LACTOSE INTOLERANCE PO Take by mouth. Before eating each meal   montelukast 10 MG tablet Commonly known as: SINGULAIR TAKE 1 TABLET AT  BEDTIME   potassium chloride 10 MEQ tablet Commonly known as: KLOR-CON TAKE 1 TABLET TWICE A DAY   ProAir HFA 108 (90 Base) MCG/ACT inhaler Generic drug: albuterol USE 2 INHALATIONS EVERY 6 HOURS AS NEEDED FOR WHEEZING OR SHORTNESS OF BREATH         REVIEW OF SYSTEMS: A comprehensive ROS was conducted with the patient and is negative except as per HPI and below:  Review of Systems  Constitutional: Negative for malaise/fatigue and weight loss.  HENT: Negative for congestion and sore throat.   Respiratory: Negative for cough and shortness of breath.        Has asthma   Cardiovascular: Negative for chest pain and palpitations.  Gastrointestinal: Positive for constipation.  Negative for nausea.  Psychiatric/Behavioral: Negative for depression. The patient is not nervous/anxious.        OBJECTIVE:  VS: BP 132/74 (BP Location: Left Arm, Patient Position: Sitting, Cuff Size: Large)   Pulse 78   Ht 5\' 3"  (1.6 m)   Wt 195 lb 9.6 oz (88.7 kg)   SpO2 96%   BMI 34.65 kg/m    Wt Readings from Last 3 Encounters:  08/13/19 195 lb 9.6 oz (88.7 kg)  08/09/19 197 lb (89.4 kg)  07/26/19 197 lb (89.4 kg)     EXAM: General: Pt appears well and is in NAD  Hydration: Well-hydrated with moist mucous membranes and good skin turgor  Eyes: External eye exam normal without stare, lid lag or exophthalmos.  EOM intact.  PERRL.  Ears, Nose, Throat: Hearing: Grossly intact bilaterally Dental: Good dentition  Throat: Clear without mass, erythema or exudate  Neck: General: Supple without adenopathy. Thyroid: Thyroid size normal.  No goiter or nodules appreciated. No thyroid bruit.  Lungs: Clear with good BS bilat with no rales, rhonchi, or wheezes  Heart: Auscultation: RRR.  Abdomen: Normoactive bowel sounds, soft, nontender, without masses or organomegaly palpable  Extremities: Gait and station: Normal gait  Digits and nails: No clubbing, cyanosis, petechiae, or nodes Head and neck: Normal alignment and mobility BL UE: Normal ROM and strength. BL LE: No pretibial edema normal ROM and strength.  Skin: Hair: Texture and amount normal with gender appropriate distribution Skin Inspection: No rashes, acanthosis nigricans/skin tags. No lipohypertrophy Skin Palpation: Skin temperature, texture, and thickness normal to palpation  Neuro: Cranial nerves: II - XII grossly intact  Cerebellar: Normal coordination and movement; no tremor Motor: Normal strength throughout DTRs: 2+ and symmetric in UE without delay in relaxation phase  Mental Status: Judgment, insight: Intact Orientation: Oriented to time, place, and person Memory: Intact for recent and remote events Mood and  affect: No depression, anxiety, or agitation     DATA REVIEWED:  Results for Wanda, Thornton (MRN [id number]) as of 08/11/2019 10:30  Ref. Range 06/29/2019 09:09  TSH Latest Ref Range: 0.35 - 4.50 uIU/mL 1.40    Thyroid Ultrasound 07/07/2019  Nodule # 1:  Location: Right; Mid  Maximum size: 0.8 cm; Other 2 dimensions: 0.6 x 0.4 cm  Composition: solid/almost completely solid (2)  Echogenicity: hypoechoic (2)  Shape: not taller-than-wide (0)  Margins: smooth (0)  Echogenic foci: none (0)  ACR TI-RADS total points: 4.  ACR TI-RADS risk category: TR4 (4-6 points).  ACR TI-RADS recommendations:  Given size (<0.9 cm) and appearance, this nodule does NOT meet TI-RADS criteria for biopsy or dedicated follow-up.  _________________________________________________________  Nodule # 2:  Location: Left; Inferior  Maximum size: 1.8 cm; Other 2 dimensions: 1.3 x 0.8 cm  Composition: solid/almost completely solid (2)  Echogenicity: hypoechoic (2)  Shape: not taller-than-wide (0)  Margins: smooth (0)  Echogenic foci: none (0)  ACR TI-RADS total points: 4.  ACR TI-RADS risk category: TR4 (4-6 points).  ACR TI-RADS recommendations:  Given size (>/= 1.5 cm) and appearance, fine needle aspiration of this moderately suspicious nodule should be considered based on TI-RADS criteria.  _________________________________________________________  IMPRESSION: There is a 1.8 cm nodule in the left inferior thyroid gland. Findings meet consensus criteria for biopsy. Ultrasound-guided fine needle aspiration should be considered, as per the consensus statement: Management of Thyroid Nodules Detected at [place]: Society of Radiologists in [place]. Radiology 2005; [id number].    Old records , labs and images have been reviewed.   ASSESSMENT/PLAN/RECOMMENDATIONS:   1. Multinodular Goiter (MNG) :   - Pt is  clinically and biochemically euthyroid  - No local neck symptoms  - In review of her thyroid ultrasound, the left inferior nodule meets ACR TI-RADS criteria for FNA, pt in agreement of this - Pt advised to contact us with any worsening thyroid enlargement   F/U in 1 yr or sooner if needed   Signed electronically by: Mack Guise, MD  [facility]  [address] [place], [place] [address] Phone: [phone] FAX: [phone]   CC: Martinique, Betty [address], [place] [address] Phone: [phone] Fax: [phone]   Return to Endocrinology clinic as below: Future Appointments  Date Time Provider Thornton  12/16/2019  9:00 AM Deneise Lever, MD [facility] None

## 2019-08-13 ENCOUNTER — Encounter: Payer: Self-pay | Admitting: Internal Medicine

## 2019-08-13 ENCOUNTER — Ambulatory Visit (INDEPENDENT_AMBULATORY_CARE_PROVIDER_SITE_OTHER): Payer: 59 | Admitting: Internal Medicine

## 2019-08-13 ENCOUNTER — Other Ambulatory Visit: Payer: Self-pay

## 2019-08-13 VITALS — BP 132/74 | HR 78 | Ht 63.0 in | Wt 195.6 lb

## 2019-08-13 DIAGNOSIS — E042 Nontoxic multinodular goiter: Secondary | ICD-10-CM

## 2019-08-13 NOTE — Patient Instructions (Addendum)
-   We have ordered a fine needle aspiration on the left nodule, if you don't hear from anyone in 2 weeks, please contact our office   - If you notice a sudden increase in size of your thyroid please contact our office

## 2019-08-15 ENCOUNTER — Encounter: Payer: Self-pay | Admitting: Gastroenterology

## 2019-09-01 ENCOUNTER — Other Ambulatory Visit (HOSPITAL_COMMUNITY)
Admission: RE | Admit: 2019-09-01 | Discharge: 2019-09-01 | Disposition: A | Discharge status: Home / Self Care | Payer: 59 | Source: Ambulatory Visit | Attending: Internal Medicine | Admitting: Internal Medicine

## 2019-09-01 ENCOUNTER — Ambulatory Visit
Admission: RE | Admit: 2019-09-01 | Discharge: 2019-09-01 | Disposition: A | Discharge status: Home / Self Care | Payer: 59 | Source: Ambulatory Visit | Attending: Internal Medicine | Admitting: Internal Medicine

## 2019-09-01 DIAGNOSIS — E042 Nontoxic multinodular goiter: Secondary | ICD-10-CM

## 2019-09-01 DIAGNOSIS — E041 Nontoxic single thyroid nodule: Secondary | ICD-10-CM | POA: Insufficient documentation

## 2019-09-02 LAB — CYTOLOGY - NON PAP

## 2019-09-27 ENCOUNTER — Encounter: Payer: Self-pay | Admitting: Family Medicine

## 2019-10-05 ENCOUNTER — Other Ambulatory Visit: Payer: Self-pay | Admitting: Family Medicine

## 2019-10-05 DIAGNOSIS — J4540 Moderate persistent asthma, uncomplicated: Secondary | ICD-10-CM

## 2019-10-05 MED ORDER — ALBUTEROL SULFATE HFA 108 (90 BASE) MCG/ACT IN AERS: 2.0000 | INHALATION_SPRAY | Freq: Four times a day (QID) | RESPIRATORY_TRACT | 2 refills | Status: DC | PRN

## 2019-10-08 ENCOUNTER — Ambulatory Visit (INDEPENDENT_AMBULATORY_CARE_PROVIDER_SITE_OTHER): Payer: 59 | Admitting: *Deleted

## 2019-10-08 ENCOUNTER — Other Ambulatory Visit: Payer: Self-pay

## 2019-10-08 DIAGNOSIS — Z23 Encounter for immunization: Secondary | ICD-10-CM

## 2019-10-08 NOTE — Progress Notes (Signed)
Per orders of Dr. Martinique, injection of Tdap given by Zacarias Pontes. Patient tolerated injection well.

## 2019-10-23 ENCOUNTER — Other Ambulatory Visit: Payer: Self-pay | Admitting: Family Medicine

## 2019-12-02 ENCOUNTER — Encounter: Payer: Self-pay | Admitting: Family Medicine

## 2019-12-02 NOTE — Telephone Encounter (Signed)
Vaccine information provided to patient to do her research on when she is eligible for vaccine.  Informed patient that unfortunately we have no control over when people are able to receive their vaccines.

## 2019-12-14 ENCOUNTER — Encounter: Payer: Self-pay | Admitting: Internal Medicine

## 2019-12-16 ENCOUNTER — Other Ambulatory Visit: Payer: Self-pay

## 2019-12-16 ENCOUNTER — Encounter: Payer: Self-pay | Admitting: Internal Medicine

## 2019-12-16 ENCOUNTER — Ambulatory Visit: Payer: 59 | Attending: Family

## 2019-12-16 ENCOUNTER — Ambulatory Visit (INDEPENDENT_AMBULATORY_CARE_PROVIDER_SITE_OTHER): Payer: 59 | Admitting: Internal Medicine

## 2019-12-16 DIAGNOSIS — G4733 Obstructive sleep apnea (adult) (pediatric): Secondary | ICD-10-CM

## 2019-12-16 DIAGNOSIS — Z23 Encounter for immunization: Secondary | ICD-10-CM

## 2019-12-16 DIAGNOSIS — J4540 Moderate persistent asthma, uncomplicated: Secondary | ICD-10-CM

## 2019-12-16 DIAGNOSIS — Z9989 Dependence on other enabling machines and devices: Secondary | ICD-10-CM | POA: Diagnosis not present

## 2019-12-16 MED ORDER — ALBUTEROL SULFATE HFA 108 (90 BASE) MCG/ACT IN AERS: 2.0000 | INHALATION_SPRAY | Freq: Four times a day (QID) | RESPIRATORY_TRACT | 4 refills | Status: DC | PRN

## 2019-12-16 MED ORDER — FLUTICASONE-SALMETEROL 250-50 MCG/DOSE IN AEPB: INHALATION_SPRAY | RESPIRATORY_TRACT | 4 refills | Status: DC

## 2019-12-16 NOTE — Progress Notes (Signed)
HPI female never smoker followed for asthma, allergic rhinitis,  OSA Allergy Profile 02/13/17- total IgE 66, specific elevations for Timothy grass and Elm pollen. Eosinophils normal 0.3 K/UL [facility], [place]/22/15-AHI 11.8/hour, desaturation to 82%, body weight 167 pounds  ------------------------------------------------------------------------------  12/16/2018- 59 year old female never smoker followed for asthma, allergic rhinitis,  OSA CPAP auto 5-15/ [employer] Download 100% compliance, AHI 1.0/ hr -----pt presents w/ seasonal allergies; OSA on CPAP, working well for her, uses well every night Advair 250, Singulair, albuterol HFA, Allegra 180, Flonase, Atarax, Benadryl,  Very pleased still with CPAP- no concerns. Increased itching, sneezing, mild chest tightness and wheeze. Blames pollen, which is high. Has used rescue albuterol twice in past 4 days.  12/16/19- 59 year old female never smoker followed for asthma, allergic rhinitis,  OSA CPAP auto 5-15/ [employer] Daily Advair, infrequent rescue. Going for Covax today CPAP auto 5-15/ [employer] Download compliance 100%, AHI 0.8/ hr Body weight today 204 lbs Machine is 57 year old, working well. Sleeping well. Walk in park aggravates rhinitis. Some seasonal variation. Advair controls well. Rare need for rescue inhaler.  ROS-see HPI    "+" = positive  Constitutional:    weight loss, night sweats, fevers, chills, fatigue, lassitude. HEENT:    headaches, difficulty swallowing, tooth/dental problems, sore throat,       + sneezing, +itching, ear ache,  +nasal congestion, post nasal drip, snoring CV:    chest pain, orthopnea, PND, swelling in lower extremities, anasarca,                                                      dizziness, palpitations Resp:   + shortness of breath with exertion or at rest.                productive cough,   non-productive cough, coughing up of blood.              change in color of mucus.  +wheezing.   Skin:     rash or lesions. GI:  No-   heartburn, indigestion, abdominal pain, nausea, vomiting, diarrhea,                 change in bowel habits, loss of appetite GU: dysuria, change in color of urine, no urgency or frequency.   flank pain. MS:   joint pain, stiffness, decreased range of motion, back pain. Neuro-     nothing unusual Psych:  change in mood or affect.  depression or anxiety.   memory loss.  OBJ- Physical Exam   General- Alert, Oriented, Affect-appropriate, Distress- none acute, + overweight Skin- rash-none, lesions- none, excoriation- none Lymphadenopathy- none Head- atraumatic            Eyes- Gross vision intact, PERRLA, conjunctivae and secretions clear            Ears- Hearing, canals-normal            Nose- + turbinate edema, no-Septal dev, mucus, polyps, erosion, perforation             Throat- Mallampati III-IV , mucosa clear , drainage- none, tonsils- atrophic Neck- flexible , trachea midline, no stridor , thyroid nl, carotid no bruit Chest - symmetrical excursion , unlabored           Heart/CV- RRR , no murmur , no gallop  , no  rub, nl s1 s2                           - JVD- none , edema- none, stasis changes- none, varices- none           Lung- clear to P&A, wheeze- none, cough- none , dullness-none, rub- none           Chest wall-  Abd-  Br/ Gen/ Rectal- Not done, not indicated Extrem- cyanosis- none, clubbing, none, atrophy- none, strength- nl Neuro- grossly intact to observation

## 2019-12-16 NOTE — Assessment & Plan Note (Signed)
Adequate control with some seasonal variation. Discussed protective effect of masking. Plan- refill advair and albuterol

## 2019-12-16 NOTE — Progress Notes (Signed)
   Covid-19 Vaccination Clinic  Name:  Hariklia Chambless    MRN: [id number] DOB: [birth year]  12/16/2019  Ms. Esquilin was observed post Covid-19 immunization for 15 minutes without incident. She was provided with Vaccine Information Sheet and instruction to access the V-Safe system.   Ms. Yount was instructed to call 911 with any severe reactions post vaccine: Marland Kitchen Difficulty breathing  . Swelling of face and throat  . A fast heartbeat  . A bad rash all over body  . Dizziness and weakness   Immunizations Administered    Name Date Dose VIS Date Route   Moderna COVID-19 Vaccine 12/16/2019 12:06 PM 0.5 mL 08/31/2019 Intramuscular   Manufacturer: Moderna   Lot: [id number]   [id number]

## 2019-12-16 NOTE — Patient Instructions (Addendum)
Refills e-sent for Advair and Ventolin  We can continue CPAP auto 5-15, mask of choice, humidifier, supplies. AirView/ card  Please call if we can help

## 2019-12-16 NOTE — Assessment & Plan Note (Signed)
Benefits from CPAP with good compliance and control Plan- continue auto 5-15 

## 2019-12-20 ENCOUNTER — Other Ambulatory Visit: Payer: Self-pay | Admitting: Internal Medicine

## 2019-12-20 DIAGNOSIS — J4540 Moderate persistent asthma, uncomplicated: Secondary | ICD-10-CM

## 2019-12-20 MED ORDER — ALBUTEROL SULFATE HFA 108 (90 BASE) MCG/ACT IN AERS: INHALATION_SPRAY | RESPIRATORY_TRACT | 4 refills | Status: DC

## 2020-01-15 ENCOUNTER — Other Ambulatory Visit: Payer: Self-pay | Admitting: Family Medicine

## 2020-01-18 ENCOUNTER — Ambulatory Visit: Payer: 59 | Attending: Family

## 2020-01-18 DIAGNOSIS — Z23 Encounter for immunization: Secondary | ICD-10-CM

## 2020-01-18 NOTE — Progress Notes (Signed)
   Covid-19 Vaccination Clinic  Name:  Wanda Thornton    MRN: [id number] DOB: 1961/03/08  01/18/2020  [name] was observed post Covid-19 immunization for 15 minutes without incident. She was provided with Vaccine Information Sheet and instruction to access the V-Safe system.   [name] was instructed to call 911 with any severe reactions post vaccine: Marland Kitchen Difficulty breathing  . Swelling of face and throat  . A fast heartbeat  . A bad rash all over body  . Dizziness and weakness   Immunizations Administered    Name Date Dose VIS Date Route   Moderna COVID-19 Vaccine 01/18/2020 11:03 AM 0.5 mL 08/2019 Intramuscular   Manufacturer: Moderna   Lot: [id number]   [id number]

## 2020-02-19 ENCOUNTER — Other Ambulatory Visit: Payer: Self-pay | Admitting: Family Medicine

## 2020-02-19 DIAGNOSIS — I10 Essential (primary) hypertension: Secondary | ICD-10-CM

## 2020-02-21 ENCOUNTER — Other Ambulatory Visit: Payer: Self-pay

## 2020-02-22 ENCOUNTER — Ambulatory Visit (INDEPENDENT_AMBULATORY_CARE_PROVIDER_SITE_OTHER): Payer: 59 | Admitting: Family Medicine

## 2020-02-22 ENCOUNTER — Telehealth: Payer: Self-pay | Admitting: Family Medicine

## 2020-02-22 ENCOUNTER — Ambulatory Visit (INDEPENDENT_AMBULATORY_CARE_PROVIDER_SITE_OTHER)
Admission: RE | Admit: 2020-02-22 | Discharge: 2020-02-22 | Disposition: A | Discharge status: Home / Self Care | Payer: 59 | Source: Ambulatory Visit | Attending: Family Medicine | Admitting: Family Medicine

## 2020-02-22 ENCOUNTER — Other Ambulatory Visit: Payer: Self-pay | Admitting: *Deleted

## 2020-02-22 ENCOUNTER — Encounter: Payer: Self-pay | Admitting: Family Medicine

## 2020-02-22 VITALS — BP 112/78 | HR 87 | Temp 97.5°F | Resp 12 | Ht 63.0 in | Wt 206.5 lb

## 2020-02-22 DIAGNOSIS — M25512 Pain in left shoulder: Secondary | ICD-10-CM

## 2020-02-22 DIAGNOSIS — M25542 Pain in joints of left hand: Secondary | ICD-10-CM

## 2020-02-22 DIAGNOSIS — M25541 Pain in joints of right hand: Secondary | ICD-10-CM

## 2020-02-22 DIAGNOSIS — I10 Essential (primary) hypertension: Secondary | ICD-10-CM

## 2020-02-22 MED ORDER — AMLODIPINE BESYLATE 10 MG PO TABS: 10.0000 mg | ORAL_TABLET | Freq: Every day | ORAL | 3 refills | Status: DC

## 2020-02-22 NOTE — Telephone Encounter (Signed)
Pt is calling in stating that she needs Rx amlodipine 10 MG  Pharm:  [facility]

## 2020-02-22 NOTE — Patient Instructions (Signed)
A few things to remember from today's visit:  Range of motion exercises. Tylenol arthritis may help.  Today X ray was ordered.  This can be done at [facility] between 8 am and 5 pm: [place]. [phone].    Osteoarthritis  Osteoarthritis is a type of arthritis that affects tissue that covers the ends of bones in joints (cartilage). Cartilage acts as a cushion between the bones and helps them move smoothly. Osteoarthritis results when cartilage in the joints gets worn down. Osteoarthritis is sometimes called "wear and tear" arthritis. Osteoarthritis is the most common form of arthritis. It often occurs in older people. It is a condition that gets worse over time (a progressive condition). Joints that are most often affected by this condition are in:  Fingers.  Toes.  Hips.  Knees.  Spine, including neck and lower back. What are the causes? This condition is caused by age-related wearing down of cartilage that covers the ends of bones. What increases the risk? The following factors may make you more likely to develop this condition:  Older age.  Being overweight or obese.  Overuse of joints, such as in athletes.  Past injury of a joint.  Past surgery on a joint.  Family history of osteoarthritis. What are the signs or symptoms? The main symptoms of this condition are pain, swelling, and stiffness in the joint. The joint may lose its shape over time. Small pieces of bone or cartilage may break off and float inside of the joint, which may cause more pain and damage to the joint. Small deposits of bone (osteophytes) may grow on the edges of the joint. Other symptoms may include:  A grating or scraping feeling inside the joint when you move it.  Popping or creaking sounds when you move. Symptoms may affect one or more joints. Osteoarthritis in a major joint, such as your knee or hip, can make it painful to walk or exercise. If you have  osteoarthritis in your hands, you might not be able to grip items, twist your hand, or control small movements of your hands and fingers (fine motor skills). How is this diagnosed? This condition may be diagnosed based on:  Your medical history.  A physical exam.  Your symptoms.  X-rays of the affected joint(s).  Blood tests to rule out other types of arthritis. How is this treated? There is no cure for this condition, but treatment can help to control pain and improve joint function. Treatment plans may include:  A prescribed exercise program that allows for rest and joint relief. You may work with a physical therapist.  A weight control plan.  Pain relief techniques, such as: ? Applying heat and cold to the joint. ? Electric pulses delivered to nerve endings under the skin (transcutaneous electrical nerve stimulation, or TENS). ? Massage. ? Certain nutritional supplements.  NSAIDs or prescription medicines to help relieve pain.  Medicine to help relieve pain and inflammation (corticosteroids). This can be given by mouth (orally) or as an injection.  Assistive devices, such as a brace, wrap, splint, specialized glove, or cane.  Surgery, such as: ? An osteotomy. This is done to reposition the bones and relieve pain or to remove loose pieces of bone and cartilage. ? Joint replacement surgery. You may need this surgery if you have very bad (advanced) osteoarthritis. Follow these instructions at home: Activity  Rest your affected joints as directed by your health care provider.  Do not drive or  use heavy machinery while taking prescription pain medicine.  Exercise as directed. Your health care provider or physical therapist may recommend specific types of exercise, such as: ? Strengthening exercises. These are done to strengthen the muscles that support joints that are affected by arthritis. They can be performed with weights or with exercise bands to add  resistance. ? Aerobic activities. These are exercises, such as brisk walking or water aerobics, that get your heart pumping. ? Range-of-motion activities. These keep your joints easy to move. ? Balance and agility exercises. Managing pain, stiffness, and swelling      If directed, apply heat to the affected area as often as told by your health care provider. Use the heat source that your health care provider recommends, such as a moist heat pack or a heating pad. ? If you have a removable assistive device, remove it as told by your health care provider. ? Place a towel between your skin and the heat source. If your health care provider tells you to keep the assistive device on while you apply heat, place a towel between the assistive device and the heat source. ? Leave the heat on for 20-30 minutes. ? Remove the heat if your skin turns bright red. This is especially important if you are unable to feel pain, heat, or cold. You may have a greater risk of getting burned.  If directed, put ice on the affected joint: ? If you have a removable assistive device, remove it as told by your health care provider. ? Put ice in a plastic bag. ? Place a towel between your skin and the bag. If your health care provider tells you to keep the assistive device on during icing, place a towel between the assistive device and the bag. ? Leave the ice on for 20 minutes, 2-3 times a day. General instructions  Take over-the-counter and prescription medicines only as told by your health care provider.  Maintain a healthy weight. Follow instructions from your health care provider for weight control. These may include dietary restrictions.  Do not use any products that contain nicotine or tobacco, such as cigarettes and e-cigarettes. These can delay bone healing. If you need help quitting, ask your health care provider.  Use assistive devices as directed by your health care provider.  Keep all follow-up visits as  told by your health care provider. This is important. Where to find more information  [employer] of Arthritis and Musculoskeletal and Skin Diseases: www.niams.SouthExposed.es  [employer] on Aging: http://kim-miller.com/  American College of Rheumatology: www.rheumatology.org Contact a health care provider if:  Your skin turns red.  You develop a rash.  You have pain that gets worse.  You have a fever along with joint or muscle aches. Get help right away if:  You lose a lot of weight.  You suddenly lose your appetite.  You have night sweats. Summary  Osteoarthritis is a type of arthritis that affects tissue covering the ends of bones in joints (cartilage).  This condition is caused by age-related wearing down of cartilage that covers the ends of bones.  The main symptom of this condition is pain, swelling, and stiffness in the joint.  There is no cure for this condition, but treatment can help to control pain and improve joint function. This information is not intended to replace advice given to you by your health care provider. Make sure you discuss any questions you have with your health care provider. Document Revised: 08/29/2017 Document Reviewed: 05/20/2016  Elsevier Patient Education  [employer].   Please be sure medication list is accurate. If a new problem present, please set up appointment sooner than planned today.

## 2020-02-22 NOTE — Telephone Encounter (Signed)
Rx sent to pharmacy as requested.

## 2020-02-22 NOTE — Progress Notes (Signed)
Chief Complaint  Patient presents with  . Shoulder Pain    left shoulder pain that started 1 month ago causing fingers to be stiff off and on   HPI: Wanda Thornton is a 59 y.o. female, who is here today with above complaint. Anterior shoulder achy like pain, 2/10,not radiated. Pain worse when she sleeps on left side. + Crepitus/pops daily with movement, no pain elicit. No limitation of ROM. Negative for fever,chills,joint edema/erythema,numbness,or skin rash. No hx trauma.  Ergonomic changes at work helped some.  She has had left shoulder pain and limitation of ROM that greatly improve with PT.  Also c/o hand joints arthralgias and stiffness for a few weeks. No joint erythema,or limitation of ROM.Edema sometimes. Stiffness is exacerbated by rest and improves with movement,worse in am. Stable.  No OTC treatments.  Intermittent fingers tingling. Hx of carpal tunnel synd. Left handed.  Review of Systems  Constitutional: Negative for activity change, appetite change and fatigue.  HENT: Negative for mouth sores and trouble swallowing.   Respiratory: Negative for cough, shortness of breath and wheezing.   Cardiovascular: Negative for chest pain, palpitations and leg swelling.  Gastrointestinal: Negative for abdominal pain, nausea and vomiting.       Negative for changes in bowel habits.  Skin: Negative for color change and pallor.  Neurological: Negative for weakness and headaches.  Psychiatric/Behavioral: Positive for sleep disturbance. Negative for confusion.  Rest see pertinent positives and negatives per HPI.  Current Outpatient Medications on File Prior to Visit  Medication Sig Dispense Refill  . albuterol (VENTOLIN HFA) 108 (90 Base) MCG/ACT inhaler Inhale 2 puffs every 6 hours as needed 54 g 4  . B Complex-C (B-COMPLEX WITH VITAMIN C) tablet Take 1 tablet by mouth daily.    Marland Kitchen CALCIUM CITRATE PO Take 2 tablets by mouth 2 (two) times daily.    .  cholecalciferol (VITAMIN D) 1000 units tablet Take 2,000 Units by mouth daily.     Marland Kitchen CRANBERRY PO Take 1 tablet by mouth daily.    . fexofenadine (ALLEGRA) 180 MG tablet Take 180 mg by mouth daily.    . fluticasone (FLONASE) 50 MCG/ACT nasal spray USE 2 SPRAYS IN EACH NOSTRIL DAILY AS NEEDED FOR ALLERGIES 48 g 3  . Fluticasone-Salmeterol (ADVAIR DISKUS) 250-50 MCG/DOSE AEPB USE 1 INHALATION TWICE A DAY 180 each 4  . Lactase (LACTOSE INTOLERANCE PO) Take by mouth. Before eating each meal    . montelukast (SINGULAIR) 10 MG tablet TAKE 1 TABLET AT BEDTIME 90 tablet 3  . potassium chloride (KLOR-CON) 10 MEQ tablet TAKE 1 TABLET TWICE A DAY 180 tablet 3  . diphenhydrAMINE (BENADRYL) 25 MG tablet Take 1 tablet (25 mg total) by mouth every 6 (six) hours for 1 day. 4 tablet 0   No current facility-administered medications on file prior to visit.    Past Medical History:  Diagnosis Date  . Allergy   . Arthritis    fingers  . Asthma   . Hypertension   . Sleep apnea    cpap   Allergies  Allergen Reactions  . Amoxicillin Hives  . Bactrim [Sulfamethoxazole-Trimethoprim] Swelling    Anaphylaxis   . Clindamycin/Lincomycin Swelling    Anaphylaxis   . Hydrocodone     Nausea vomiting  . Keflex [Cephalexin] Swelling    Anaphylaxis   . Lisinopril     headache    Social History   Socioeconomic History  . Marital status: Married    Spouse name: Not  on file  . Number of children: Not on file  . Years of education: Not on file  . Highest education level: Not on file  Occupational History  . Not on file  Tobacco Use  . Smoking status: Never Smoker  . Smokeless tobacco: Never Used  Substance and Sexual Activity  . Alcohol use: No  . Drug use: No  . Sexual activity: Yes  Other Topics Concern  . Not on file  Social History Narrative  . Not on file   Social Determinants of Health   Financial Resource Strain:   . Difficulty of Paying Living Expenses:   Food Insecurity:   . Worried  About Charity fundraiser in the Last Year:   . Arboriculturist in the Last Year:   Transportation Needs:   . Film/video editor (Medical):   Marland Kitchen Lack of Transportation (Non-Medical):   Physical Activity:   . Days of Exercise per Week:   . Minutes of Exercise per Session:   Stress:   . Feeling of Stress :   Social Connections:   . Frequency of Communication with Friends and Family:   . Frequency of Social Gatherings with Friends and Family:   . Attends Religious Services:   . Active Member of Clubs or Organizations:   . Attends Archivist Meetings:   Marland Kitchen Marital Status:     Vitals:   02/22/20 0835  BP: 112/78  Pulse: 87  Resp: 12  Temp: (!) 97.5 F (36.4 C)  SpO2: 99%   Body mass index is 36.58 kg/m.  Physical Exam  Nursing note and vitals reviewed. Constitutional: She is oriented to person, place, and time. She appears well-developed. She does not appear ill. No distress.  HENT:  Head: Normocephalic and atraumatic.  Eyes: Conjunctivae are normal.  Cardiovascular: Normal rate and regular rhythm.  No murmur heard. Respiratory: Effort normal and breath sounds normal. No respiratory distress.  Musculoskeletal:        General: No edema.     Left shoulder: Tenderness present. No deformity or bony tenderness. Normal range of motion.     Right wrist: No tenderness or bony tenderness. Normal range of motion.     Left wrist: No tenderness or bony tenderness. Normal range of motion.     Left hand: Tenderness (DIP 3rd finger.) present. Normal range of motion. Normal capillary refill.     Comments: Left shoulder: Pain upon palpation on anterior aspect. No deformity, edema, or erythema appreciated.No muscle atrophy. Luan Pulling' test negative, drop arm rotator cuff test negative, empty can supraspinatus test negative, lift-Off Subscapularis test negative. ROM otherwise normal,it does not elicit pain.  Tinel and Phalen negative bilateral. No signs of synovitis.    Neurological: She is alert and oriented to person, place, and time. She has normal strength.  Skin: Skin is warm. No rash noted. No erythema.  Psychiatric: She has a normal mood and affect.  Well groomed, good eye contact.   ASSESSMENT AND PLAN:  Wanda Thornton was seen today for shoulder pain.  Diagnoses and all orders for this visit:  Left shoulder pain, unspecified chronicity Possible etiologies discussed, OA could be an aggravating factor. ? Tendinitis. ROM exercises, we could arrange PT is not better. Further recommendations according to imaging.  -     DG Shoulder Left; Future  Arthralgia of both hands Educated about Dx,prognosis,and treatment options. Recommend Tylenol 500 mg tid prn. F/U as needed.   Return if symptoms worsen or fail to  improve.   Novah Nessel G. [place], MD  [facility]. [place] office.  Discharge Instructions   None     A few things to remember from today's visit:  Range of motion exercises. Tylenol arthritis may help.  Today X ray was ordered.  This can be done at [facility] between 8 am and 5 pm: [place]. [phone].    Osteoarthritis  Osteoarthritis is a type of arthritis that affects tissue that covers the ends of bones in joints (cartilage). Cartilage acts as a cushion between the bones and helps them move smoothly. Osteoarthritis results when cartilage in the joints gets worn down. Osteoarthritis is sometimes called "wear and tear" arthritis. Osteoarthritis is the most common form of arthritis. It often occurs in older people. It is a condition that gets worse over time (a progressive condition). Joints that are most often affected by this condition are in:  Fingers.  Toes.  Hips.  Knees.  Spine, including neck and lower back. What are the causes? This condition is caused by age-related wearing down of cartilage that covers the ends of bones. What increases the risk? The  following factors may make you more likely to develop this condition:  Older age.  Being overweight or obese.  Overuse of joints, such as in athletes.  Past injury of a joint.  Past surgery on a joint.  Family history of osteoarthritis. What are the signs or symptoms? The main symptoms of this condition are pain, swelling, and stiffness in the joint. The joint may lose its shape over time. Small pieces of bone or cartilage may break off and float inside of the joint, which may cause more pain and damage to the joint. Small deposits of bone (osteophytes) may grow on the edges of the joint. Other symptoms may include:  A grating or scraping feeling inside the joint when you move it.  Popping or creaking sounds when you move. Symptoms may affect one or more joints. Osteoarthritis in a major joint, such as your knee or hip, can make it painful to walk or exercise. If you have osteoarthritis in your hands, you might not be able to grip items, twist your hand, or control small movements of your hands and fingers (fine motor skills). How is this diagnosed? This condition may be diagnosed based on:  Your medical history.  A physical exam.  Your symptoms.  X-rays of the affected joint(s).  Blood tests to rule out other types of arthritis. How is this treated? There is no cure for this condition, but treatment can help to control pain and improve joint function. Treatment plans may include:  A prescribed exercise program that allows for rest and joint relief. You may work with a physical therapist.  A weight control plan.  Pain relief techniques, such as: ? Applying heat and cold to the joint. ? Electric pulses delivered to nerve endings under the skin (transcutaneous electrical nerve stimulation, or TENS). ? Massage. ? Certain nutritional supplements.  NSAIDs or prescription medicines to help relieve pain.  Medicine to help relieve pain and inflammation (corticosteroids). This  can be given by mouth (orally) or as an injection.  Assistive devices, such as a brace, wrap, splint, specialized glove, or cane.  Surgery, such as: ? An osteotomy. This is done to reposition the bones and relieve pain or to remove loose pieces of bone and cartilage. ? Joint replacement surgery. You may need this surgery if you have very bad (advanced)  osteoarthritis. Follow these instructions at home: Activity  Rest your affected joints as directed by your health care provider.  Do not drive or use heavy machinery while taking prescription pain medicine.  Exercise as directed. Your health care provider or physical therapist may recommend specific types of exercise, such as: ? Strengthening exercises. These are done to strengthen the muscles that support joints that are affected by arthritis. They can be performed with weights or with exercise bands to add resistance. ? Aerobic activities. These are exercises, such as brisk walking or water aerobics, that get your heart pumping. ? Range-of-motion activities. These keep your joints easy to move. ? Balance and agility exercises. Managing pain, stiffness, and swelling      If directed, apply heat to the affected area as often as told by your health care provider. Use the heat source that your health care provider recommends, such as a moist heat pack or a heating pad. ? If you have a removable assistive device, remove it as told by your health care provider. ? Place a towel between your skin and the heat source. If your health care provider tells you to keep the assistive device on while you apply heat, place a towel between the assistive device and the heat source. ? Leave the heat on for 20-30 minutes. ? Remove the heat if your skin turns bright red. This is especially important if you are unable to feel pain, heat, or cold. You may have a greater risk of getting burned.  If directed, put ice on the affected joint: ? If you have a  removable assistive device, remove it as told by your health care provider. ? Put ice in a plastic bag. ? Place a towel between your skin and the bag. If your health care provider tells you to keep the assistive device on during icing, place a towel between the assistive device and the bag. ? Leave the ice on for 20 minutes, 2-3 times a day. General instructions  Take over-the-counter and prescription medicines only as told by your health care provider.  Maintain a healthy weight. Follow instructions from your health care provider for weight control. These may include dietary restrictions.  Do not use any products that contain nicotine or tobacco, such as cigarettes and e-cigarettes. These can delay bone healing. If you need help quitting, ask your health care provider.  Use assistive devices as directed by your health care provider.  Keep all follow-up visits as told by your health care provider. This is important. Where to find more information  [employer] of Arthritis and Musculoskeletal and Skin Diseases: www.niams.SouthExposed.es  [employer] on Aging: http://kim-miller.com/  American College of Rheumatology: www.rheumatology.org Contact a health care provider if:  Your skin turns red.  You develop a rash.  You have pain that gets worse.  You have a fever along with joint or muscle aches. Get help right away if:  You lose a lot of weight.  You suddenly lose your appetite.  You have night sweats. Summary  Osteoarthritis is a type of arthritis that affects tissue covering the ends of bones in joints (cartilage).  This condition is caused by age-related wearing down of cartilage that covers the ends of bones.  The main symptom of this condition is pain, swelling, and stiffness in the joint.  There is no cure for this condition, but treatment can help to control pain and improve joint function. This information is not intended to replace advice given to you by  your  health care provider. Make sure you discuss any questions you have with your health care provider. Document Revised: 08/29/2017 Document Reviewed: 05/20/2016 Elsevier Patient Education  [place].   Please be sure medication list is accurate. If a new problem present, please set up appointment sooner than planned today.

## 2020-02-27 ENCOUNTER — Encounter: Payer: Self-pay | Admitting: Family Medicine

## 2020-04-06 ENCOUNTER — Emergency Department (HOSPITAL_COMMUNITY)
Admission: EM | Admit: 2020-04-06 | Discharge: 2020-04-07 | Disposition: A | Discharge status: Home / Self Care | Payer: 59 | Attending: Emergency Medicine | Admitting: Emergency Medicine

## 2020-04-06 ENCOUNTER — Other Ambulatory Visit: Payer: Self-pay

## 2020-04-06 ENCOUNTER — Encounter (HOSPITAL_COMMUNITY): Payer: Self-pay | Admitting: Emergency Medicine

## 2020-04-06 DIAGNOSIS — R0989 Other specified symptoms and signs involving the circulatory and respiratory systems: Secondary | ICD-10-CM | POA: Diagnosis not present

## 2020-04-06 DIAGNOSIS — Z5321 Procedure and treatment not carried out due to patient leaving prior to being seen by health care provider: Secondary | ICD-10-CM | POA: Insufficient documentation

## 2020-04-06 NOTE — ED Triage Notes (Signed)
Pt states she took some Benadryl 50 mg PO pta to ED.

## 2020-04-06 NOTE — ED Triage Notes (Signed)
Pt states she is having allergic reaction since yesterday, states she feel like her throat is swollen. Pt is able to talk on full sentences, no coughing on triage, no swollen on her tong or her throat noticed.

## 2020-04-07 NOTE — ED Notes (Signed)
Pt not responding in lobby for bed.

## 2020-04-24 ENCOUNTER — Ambulatory Visit (INDEPENDENT_AMBULATORY_CARE_PROVIDER_SITE_OTHER): Payer: 59 | Admitting: Primary Care

## 2020-04-24 ENCOUNTER — Encounter: Payer: Self-pay | Admitting: Primary Care

## 2020-04-24 ENCOUNTER — Other Ambulatory Visit: Payer: Self-pay

## 2020-04-24 VITALS — BP 124/76 | HR 88 | Temp 98.8°F | Ht 62.0 in | Wt 207.4 lb

## 2020-04-24 DIAGNOSIS — Z9989 Dependence on other enabling machines and devices: Secondary | ICD-10-CM

## 2020-04-24 DIAGNOSIS — G4733 Obstructive sleep apnea (adult) (pediatric): Secondary | ICD-10-CM

## 2020-04-24 DIAGNOSIS — J4540 Moderate persistent asthma, uncomplicated: Secondary | ICD-10-CM | POA: Diagnosis not present

## 2020-04-24 DIAGNOSIS — J302 Other seasonal allergic rhinitis: Secondary | ICD-10-CM

## 2020-04-24 DIAGNOSIS — J3089 Other allergic rhinitis: Secondary | ICD-10-CM | POA: Diagnosis not present

## 2020-04-24 MED ORDER — PREDNISONE 10 MG PO TABS: ORAL_TABLET | ORAL | 0 refills | Status: DC

## 2020-04-24 NOTE — Assessment & Plan Note (Signed)
-  Patient is on the present compliant with CPAP and reports benefit from use -Pressure 5-15 cm H2O; AHI 0.9 -No changes today -Follow-up in 1 year

## 2020-04-24 NOTE — Progress Notes (Signed)
@Patient  ID: Wanda Thornton, female    DOB: 09/29/1961, 59 y.o.   MRN: [id number]  Chief Complaint  Patient presents with  . Follow-up    Referring provider: [place], Betty G, MD  HPI: 59 year old female, never smoked.  Past medical history significant for moderate persistent asthma, OSA on CPAP.  Patient of Dr. Annamaria Boots, last seen in office on 12/20/2019. [facility], [place]/22/15-[facility]I 11.8/hour, desaturation to 82%, body weight 167 pounds. Maintained on auto CPAP. Allergy profile in 2018 showed +timothy gras and elm pollen. IgE 66,Eosinophils 300. Maintained on Advair 250-50, Singulair, Allegra and Flonase.   04/24/2020 Patient presents today for 25-month follow-up office visit for asthma/OSA. Reports intermittent allergy symptoms occurring every other day. Reports scratchy throat, itchy eyes, sneezing. Intermittent chest tightness, wheezing and cough with allergy symtpoms. Compliant with Advair 250 one puff daily, Allergra, Singulair and Flonase. She has been taking benadryl as needed. She is using Albuterol rescue inhaler once a day.  She experiences allergy symptoms year round specifically to tree pollen. She recent went to ED. She has previously been on allergy shots when in [place] but this was not effective and worsened her symptoms.   [facility] download 03/22/2020-04/20/2020: 30/30 days (100%); percent greater than 4 hours Average usage 8 hours 14 minutes Pressure 5-15 cm H2O (11.5 cm H2O-95th percentile) No air leaks AHI 0.9  Allergies  Allergen Reactions  . Amoxicillin Hives  . Bactrim [Sulfamethoxazole-Trimethoprim] Swelling    Anaphylaxis   . Clindamycin/Lincomycin Swelling    Anaphylaxis   . Hydrocodone     Nausea vomiting  . Keflex [Cephalexin] Swelling    Anaphylaxis   . Lisinopril     headache    Immunization History  Administered Date(s) Administered  . Influenza,inj,Quad PF,6+ Mos 10/16/1998, 06/17/2017, 06/17/2018, 06/29/2019  . Moderna  SARS-COVID-2 Vaccination 12/16/2019, 01/18/2020  . Pneumococcal Polysaccharide-23 11/07/2015  . Tdap 09/15/2009, 10/08/2019    Past Medical History:  Diagnosis Date  . Allergy   . Arthritis    fingers  . Asthma   . Hypertension   . Sleep apnea    cpap    Tobacco History: Social History   Tobacco Use  Smoking Status Never Smoker  Smokeless Tobacco Never Used   Counseling given: Not Answered   Outpatient Medications Prior to Visit  Medication Sig Dispense Refill  . albuterol (VENTOLIN HFA) 108 (90 Base) MCG/ACT inhaler Inhale 2 puffs every 6 hours as needed 54 g 4  . amLODipine (NORVASC) 10 MG tablet Take 1 tablet (10 mg total) by mouth daily. 90 tablet 3  . B Complex-C (B-COMPLEX WITH VITAMIN C) tablet Take 1 tablet by mouth daily.    Marland Kitchen CALCIUM CITRATE PO Take 2 tablets by mouth 2 (two) times daily.    . cholecalciferol (VITAMIN D) 1000 units tablet Take 2,000 Units by mouth daily.     Marland Kitchen CRANBERRY PO Take 1 tablet by mouth daily.    . fexofenadine (ALLEGRA) 180 MG tablet Take 180 mg by mouth daily.    . fluticasone (FLONASE) 50 MCG/ACT nasal spray USE 2 SPRAYS IN EACH NOSTRIL DAILY AS NEEDED FOR ALLERGIES 48 g 3  . Fluticasone-Salmeterol (ADVAIR DISKUS) 250-50 MCG/DOSE AEPB USE 1 INHALATION TWICE A DAY 180 each 4  . Lactase (LACTOSE INTOLERANCE PO) Take by mouth. Before eating each meal    . montelukast (SINGULAIR) 10 MG tablet TAKE 1 TABLET AT BEDTIME 90 tablet 3  . potassium chloride (KLOR-CON) 10 MEQ tablet TAKE 1 TABLET TWICE A DAY 180  tablet 3  . diphenhydrAMINE (BENADRYL) 25 MG tablet Take 1 tablet (25 mg total) by mouth every 6 (six) hours for 1 day. 4 tablet 0   No facility-administered medications prior to visit.   Review of Systems  Review of Systems  Constitutional: Negative.   HENT: Positive for postnasal drip and sneezing.   Eyes: Positive for itching.  Respiratory: Negative.     Physical Exam  BP 124/76 (BP Location: Right Arm, Cuff Size: Normal)    Pulse 88   Temp 98.8 F (37.1 C) (Oral)   Ht 5\' 2"  (1.575 m)   Wt (!) 207 lb 6.4 oz (94.1 kg)   SpO2 98%   BMI 37.93 kg/m  Physical Exam Constitutional:      Appearance: Normal appearance.  HENT:     Head: Normocephalic and atraumatic.     Mouth/Throat:     Mouth: Mucous membranes are moist.     Pharynx: Oropharynx is clear.  Cardiovascular:     Rate and Rhythm: Normal rate and regular rhythm.  Pulmonary:     Effort: Pulmonary effort is normal.     Breath sounds: Normal breath sounds.  Neurological:     General: No focal deficit present.     Mental Status: She is alert and oriented to person, place, and time. Mental status is at baseline.  Psychiatric:        Mood and Affect: Mood normal.        Behavior: Behavior normal.        Thought Content: Thought content normal.        Judgment: Judgment normal.      Lab Results:  CBC    Component Value Date/Time   WBC 6.5 06/17/2018 1029   RBC 4.70 06/17/2018 1029   HGB 14.9 06/17/2018 1029   HCT 42.4 06/17/2018 1029   PLT 305.0 06/17/2018 1029   MCV 90.4 06/17/2018 1029   MCHC 35.0 06/17/2018 1029   RDW 14.0 06/17/2018 1029   LYMPHSABS 2.5 02/13/2017 1100   MONOABS 0.8 02/13/2017 1100   EOSABS 0.3 02/13/2017 1100   BASOSABS 0.1 02/13/2017 1100    BMET    Component Value Date/Time   NA 142 06/29/2019 0909   NA 143 10/27/2012 0000   K 3.6 06/29/2019 0909   CL 104 06/29/2019 0909   CO2 28 06/29/2019 0909   GLUCOSE 87 06/29/2019 0909   BUN 10 06/29/2019 0909   CREATININE 0.66 06/29/2019 0909   CALCIUM 9.7 06/29/2019 0909    BNP No results found for: BNP  ProBNP No results found for: PROBNP  Imaging: No results found.   Assessment & Plan:   Asthma, extrinsic, moderate persistent, uncomplicated - Asthma symptoms intermittently exacerbated by allergy symptoms. She has not required oral prednisone in the last year but did present to ED on 04/06/20 with allergy/asthma symptoms. She left after not being seen  and symptoms improving with bronchodilator  - Continue Advair 250-24mcg 1 puff twice daily (rinse mouth after use) - Continue Albuterol rescue inhaler 2 puffs every 4-6 hours as needed for breakthrough shortness of breath or wheezing - Continue Singulair 10 mg at bedtime - Sending in prednisone taper to have on hand as needed asthma exacerbation - Could consider increasing to Advair 500-97mcg one puff twice daily  - Checking CBC with Diff and IgE to help manage further   - FU in 4-6 months with Dr. Annamaria Boots   Seasonal and perennial allergic rhinitis - Allergy profile 02/13/17 - IgE 66, +timothy  gras and elm pollen; Eosinophils 300 - Continue Allergra 180mg  daily, Singulair 10 at bedtime and Flonase 1-2 sprays per nostril daily  - PRN Benadryl for breakthrough allergy symptoms   OSA on CPAP -Patient is on the present compliant with CPAP and reports benefit from use -Pressure 5-15 cm H2O; AHI 0.9 -No changes today -Follow-up in 1 year     Martyn Ehrich, NP 04/24/2020

## 2020-04-24 NOTE — Assessment & Plan Note (Addendum)
-   Asthma symptoms intermittently exacerbated by allergy symptoms. She has not required oral prednisone in the last year but did present to ED on 04/06/20 with allergy/asthma symptoms. She left after not being seen and symptoms improving with bronchodilator  - Continue Advair 250-69mcg 1 puff twice daily (rinse mouth after use) - Continue Albuterol rescue inhaler 2 puffs every 4-6 hours as needed for breakthrough shortness of breath or wheezing - Continue Singulair 10 mg at bedtime - Sending in prednisone taper to have on hand as needed asthma exacerbation - Could consider increasing to Advair 500-11mcg one puff twice daily  - Checking CBC with Diff and IgE to help manage further   - FU in 4-6 months with Dr. Annamaria Boots

## 2020-04-24 NOTE — Patient Instructions (Addendum)
Pleasure meeting you today Wanda Thornton  Moderate persistent asthma - Continue Advair 250-10mcg 1 puff twice daily (rinse mouth after use) - Continue albuterol rescue inhaler 2 puffs every 4-6 hours as needed for breakthrough shortness of breath or wheezing - Continue Singulair 10 mg at bedtime - Sending in prednisone taper to have on hand for you to take if you develop acute asthma symptoms (please notify office if you need to take this)  Sleep apnea - Continue to wear CPAP for 4 to 6 hours or more each night - Do not drive if experiencing excessive daytime fatigue or somnolence - No changes to pressure settings today  Nonseasonal allergies - Continue Allegra once daily - Continue Flonase 1 to 2 sprays per nostril daily - Take Benadryl as needed for breakthrough allergy symptoms   Orders - Labs today (ordered CBC and IgE)  Follow-up - 4 to 6 months with Dr. Annamaria Boots for asthma/allergies

## 2020-04-24 NOTE — Assessment & Plan Note (Signed)
-   Allergy profile 02/13/17 - IgE 66, +timothy gras and elm pollen; Eosinophils 300 - Continue Allergra 180mg  daily, Singulair 10 at bedtime and Flonase 1-2 sprays per nostril daily  - PRN Benadryl for breakthrough allergy symptoms

## 2020-04-25 LAB — CBC WITH DIFFERENTIAL/PLATELET
Basophils Absolute: 0.1 10*3/uL (ref 0.0–0.1)
Basophils Relative: 0.8 % (ref 0.0–3.0)
Eosinophils Absolute: 0.4 10*3/uL (ref 0.0–0.7)
Eosinophils Relative: 3.9 % (ref 0.0–5.0)
HCT: 43.1 % (ref 36.0–46.0)
Hemoglobin: 14.9 g/dL (ref 12.0–15.0)
Lymphocytes Relative: 25.9 % (ref 12.0–46.0)
Lymphs Abs: 2.8 10*3/uL (ref 0.7–4.0)
MCHC: 34.6 g/dL (ref 30.0–36.0)
MCV: 92.3 fl (ref 78.0–100.0)
Monocytes Absolute: 1 10*3/uL (ref 0.1–1.0)
Monocytes Relative: 9.2 % (ref 3.0–12.0)
Neutro Abs: 6.6 10*3/uL (ref 1.4–7.7)
Neutrophils Relative %: 60.2 % (ref 43.0–77.0)
Platelets: 347 10*3/uL (ref 150.0–400.0)
RBC: 4.66 Mil/uL (ref 3.87–5.11)
RDW: 13.6 % (ref 11.5–15.5)
WBC: 11 10*3/uL — ABNORMAL HIGH (ref 4.0–10.5)

## 2020-04-25 LAB — IGE: IgE (Immunoglobulin E), Serum: 73 kU/L (ref <=114)

## 2020-04-28 ENCOUNTER — Encounter: Payer: Self-pay | Admitting: Family Medicine

## 2020-04-28 NOTE — Progress Notes (Signed)
Please let patient know IgE level was 73 and eosinophils were 400 which are elevated. Again this will help [place] manager her in th future if needed. Follow up with Dr. Annamaria Boots in 3-4 months or sooner if needed. We can always go up on [place] if she has worsening asthma symptoms

## 2020-04-29 ENCOUNTER — Other Ambulatory Visit: Payer: Self-pay | Admitting: Family Medicine

## 2020-04-29 DIAGNOSIS — J4540 Moderate persistent asthma, uncomplicated: Secondary | ICD-10-CM

## 2020-05-01 MED ORDER — FLUTICASONE-SALMETEROL 500-50 MCG/DOSE IN AEPB: 1.0000 | INHALATION_SPRAY | Freq: Two times a day (BID) | RESPIRATORY_TRACT | 3 refills | Status: DC

## 2020-05-01 NOTE — Telephone Encounter (Signed)
Beth, please see pt's mychart message and advise.

## 2020-05-01 NOTE — Telephone Encounter (Signed)
Yes please send in RX for Advair 500 1 puff twice daily

## 2020-05-01 NOTE — Telephone Encounter (Signed)
Advair 500 has been sent to preferred pharmacy for pt. Nothing further needed.

## 2020-05-23 ENCOUNTER — Other Ambulatory Visit: Payer: Self-pay | Admitting: Family Medicine

## 2020-05-23 DIAGNOSIS — Z1231 Encounter for screening mammogram for malignant neoplasm of breast: Secondary | ICD-10-CM

## 2020-06-29 ENCOUNTER — Encounter: Payer: Self-pay | Admitting: Family Medicine

## 2020-06-30 ENCOUNTER — Other Ambulatory Visit: Payer: Self-pay

## 2020-06-30 DIAGNOSIS — I10 Essential (primary) hypertension: Secondary | ICD-10-CM

## 2020-06-30 DIAGNOSIS — Z131 Encounter for screening for diabetes mellitus: Secondary | ICD-10-CM

## 2020-06-30 DIAGNOSIS — E559 Vitamin D deficiency, unspecified: Secondary | ICD-10-CM

## 2020-06-30 DIAGNOSIS — E785 Hyperlipidemia, unspecified: Secondary | ICD-10-CM

## 2020-07-05 ENCOUNTER — Ambulatory Visit
Admission: RE | Admit: 2020-07-05 | Discharge: 2020-07-05 | Disposition: A | Discharge status: Home / Self Care | Payer: 59 | Source: Ambulatory Visit | Attending: Family Medicine | Admitting: Family Medicine

## 2020-07-05 ENCOUNTER — Other Ambulatory Visit: Payer: Self-pay

## 2020-07-05 DIAGNOSIS — Z1231 Encounter for screening mammogram for malignant neoplasm of breast: Secondary | ICD-10-CM

## 2020-07-10 ENCOUNTER — Other Ambulatory Visit: Payer: Self-pay | Admitting: Family Medicine

## 2020-07-10 ENCOUNTER — Encounter: Payer: 59 | Admitting: Family Medicine

## 2020-07-10 ENCOUNTER — Other Ambulatory Visit (INDEPENDENT_AMBULATORY_CARE_PROVIDER_SITE_OTHER): Payer: 59

## 2020-07-10 ENCOUNTER — Other Ambulatory Visit: Payer: Self-pay

## 2020-07-10 DIAGNOSIS — I10 Essential (primary) hypertension: Secondary | ICD-10-CM

## 2020-07-10 DIAGNOSIS — E785 Hyperlipidemia, unspecified: Secondary | ICD-10-CM

## 2020-07-10 DIAGNOSIS — R928 Other abnormal and inconclusive findings on diagnostic imaging of breast: Secondary | ICD-10-CM

## 2020-07-10 DIAGNOSIS — E559 Vitamin D deficiency, unspecified: Secondary | ICD-10-CM

## 2020-07-10 DIAGNOSIS — Z131 Encounter for screening for diabetes mellitus: Secondary | ICD-10-CM

## 2020-07-10 DIAGNOSIS — Z23 Encounter for immunization: Secondary | ICD-10-CM | POA: Diagnosis not present

## 2020-07-11 LAB — COMPLETE METABOLIC PANEL WITH GFR
AG Ratio: 1.4 (calc) (ref 1.0–2.5)
ALT: 17 U/L (ref 6–29)
AST: 18 U/L (ref 10–35)
Albumin: 4.2 g/dL (ref 3.6–5.1)
Alkaline phosphatase (APISO): 101 U/L (ref 37–153)
BUN: 11 mg/dL (ref 7–25)
CO2: 28 mmol/L (ref 20–32)
Calcium: 9.4 mg/dL (ref 8.6–10.4)
Chloride: 106 mmol/L (ref 98–110)
Creat: 0.73 mg/dL (ref 0.50–1.05)
GFR, Est African American: 107 mL/min/{1.73_m2} (ref >=60)
GFR, Est Non African American: 92 mL/min/{1.73_m2} (ref >=60)
Globulin: 3.1 g/dL (calc) (ref 1.9–3.7)
Glucose, Bld: 98 mg/dL (ref 65–99)
Potassium: 3.3 mmol/L — ABNORMAL LOW (ref 3.5–5.3)
Sodium: 142 mmol/L (ref 135–146)
Total Bilirubin: 1 mg/dL (ref 0.2–1.2)
Total Protein: 7.3 g/dL (ref 6.1–8.1)

## 2020-07-11 LAB — HEMOGLOBIN A1C
Hgb A1c MFr Bld: 4.8 % of total Hgb (ref <5.7)
Mean Plasma Glucose: 91 (calc)
eAG (mmol/L): 5 (calc)

## 2020-07-11 LAB — TSH: TSH: 2.13 mIU/L (ref 0.40–4.50)

## 2020-07-11 LAB — VITAMIN D 25 HYDROXY (VIT D DEFICIENCY, FRACTURES): Vit D, 25-Hydroxy: 47 ng/mL (ref 30–100)

## 2020-07-14 ENCOUNTER — Encounter: Payer: Self-pay | Admitting: Family Medicine

## 2020-07-14 ENCOUNTER — Ambulatory Visit
Admission: RE | Admit: 2020-07-14 | Discharge: 2020-07-14 | Disposition: A | Discharge status: Home / Self Care | Payer: 59 | Source: Ambulatory Visit | Attending: Family Medicine | Admitting: Family Medicine

## 2020-07-14 ENCOUNTER — Other Ambulatory Visit: Payer: Self-pay

## 2020-07-14 ENCOUNTER — Ambulatory Visit (INDEPENDENT_AMBULATORY_CARE_PROVIDER_SITE_OTHER): Payer: 59 | Admitting: Family Medicine

## 2020-07-14 VITALS — BP 126/80 | HR 96 | Resp 16 | Ht 62.0 in | Wt 204.4 lb

## 2020-07-14 DIAGNOSIS — R928 Other abnormal and inconclusive findings on diagnostic imaging of breast: Secondary | ICD-10-CM

## 2020-07-14 DIAGNOSIS — E785 Hyperlipidemia, unspecified: Secondary | ICD-10-CM | POA: Diagnosis not present

## 2020-07-14 DIAGNOSIS — Z6837 Body mass index (BMI) 37.0-37.9, adult: Secondary | ICD-10-CM

## 2020-07-14 DIAGNOSIS — Z0000 Encounter for general adult medical examination without abnormal findings: Secondary | ICD-10-CM | POA: Diagnosis not present

## 2020-07-14 DIAGNOSIS — I10 Essential (primary) hypertension: Secondary | ICD-10-CM

## 2020-07-14 DIAGNOSIS — E876 Hypokalemia: Secondary | ICD-10-CM | POA: Insufficient documentation

## 2020-07-14 MED ORDER — POTASSIUM CHLORIDE CRYS ER 20 MEQ PO TBCR: 20.0000 meq | EXTENDED_RELEASE_TABLET | Freq: Every day | ORAL | 3 refills | Status: DC

## 2020-07-14 MED ORDER — POTASSIUM CHLORIDE CRYS ER 20 MEQ PO TBCR
20.0000 meq | EXTENDED_RELEASE_TABLET | Freq: Every day | ORAL | 3 refills | Status: DC
Start: 2020-07-14 — End: 2020-07-18

## 2020-07-14 NOTE — Patient Instructions (Addendum)
Today you have you routine preventive visit. A few things to remember from today's visit:  Hypertension, essential, benign  Dyslipidemia (high LDL; low HDL) - Plan: Lipid panel  Routine general medical examination at a health care facility  Hypokalemia - Plan: Potassium  Albuterol inh could have contribute to low potassium. We will repeat labs in 3-4 weeks, fasting.  If you need refills please call your pharmacy. Do not use My Chart to request refills or for acute issues that need immediate attention.    Please be sure medication list is accurate. If a new problem present, please set up appointment sooner than planned today.  At least 150 minutes of moderate exercise per week, daily brisk walking for 15-30 min is a good exercise option. Healthy diet low in saturated (animal) fats and sweets and consisting of fresh fruits and vegetables, lean meats such as fish and white chicken and whole grains.  These are some of recommendations for screening depending of age and risk factors:  - Vaccines:  Tdap vaccine every 10 years.  Shingles vaccine recommended at age 45, could be given after 57 years of age but not sure about insurance coverage.   Pneumonia vaccines: Pneumovax at 66. Sometimes Pneumovax is giving earlier if history of smoking, lung disease,diabetes,kidney disease among some.  Screening for diabetes at age 39 and every 3 years.  Cervical cancer prevention:  Pap smear starts at 57 years of age and continues periodically until 57 years old in low risk women. Pap smear every 3 years between 61 and 73 years old. Pap smear every 3-5 years between women 9 and older if pap smear negative and HPV screening negative.   -Breast cancer: Mammogram: There is disagreement between experts about when to start screening in low risk asymptomatic female but recent recommendations are to start screening at 37 and not later than 57 years old , every 1-2 years and after 57 yo q 2 years.  Screening is recommended until 57 years old but some women can continue screening depending of healthy issues.  Colon cancer screening: Has been recently changed to 57 yo. Insurance may not cover until you are 57 years old. Screening is recommended until 57 years old.  Cholesterol disorder screening at age 29 and every 3 years.  Also recommended:  1. Dental visit- Brush and floss your teeth twice daily; visit your dentist twice a year. 2. Eye doctor- Get an eye exam at least every 2 years. 3. Helmet use- Always wear a helmet when riding a bicycle, motorcycle, rollerblading or skateboarding. 4. Safe sex- If you may be exposed to sexually transmitted infections, use a condom. 5. Seat belts- Seat belts can save your live; always wear one. 6. Smoke/Carbon Monoxide detectors- These detectors need to be installed on the appropriate level of your home. Replace batteries at least once a year. 7. Skin cancer- When out in the sun please cover up and use sunscreen 15 SPF or higher. 8. Violence- If anyone is threatening or hurting you, please tell your healthcare provider.  9. Drink alcohol in moderation- Limit alcohol intake to one drink or less per day. Never drink and drive. 10. Calcium supplementation 1000 to 1200 mg daily, ideally through your diet.  Vitamin D supplementation 800 units daily.

## 2020-07-14 NOTE — Assessment & Plan Note (Addendum)
Continue non pharmacologic treatment. Will have FLP check with next labs.

## 2020-07-14 NOTE — Assessment & Plan Note (Addendum)
She has lost a couple of pounds since her last visit. We discussed benefits of wt loss as well as adverse effects of obesity. Consistency with healthy diet and physical activity recommended.

## 2020-07-14 NOTE — Progress Notes (Signed)
HPI: [name] is a pleasant 59 y.o. female, who is here today for her routine physical.  Last CPE: 03/11/18  Regular exercise 3 or more time per week: She has not done consistently lately because allergies, so she got an elliptical. Started stationary bike 2 times per week. Following a healthy diet: Yes. She is cooking at home.  She lives with her husband.  Chronic medical problems: OSA on CPAP, HTN,vit D deficiency,hypoK+,and HLD among some. She had blood work recently.  Pap smear: S/P hysterectomy  Immunization History  Administered Date(s) Administered  . Influenza,inj,Quad PF,6+ Mos 10/16/1998, 06/17/2017, 06/17/2018, 06/29/2019, 07/10/2020  . Moderna SARS-COVID-2 Vaccination 12/16/2019, 01/18/2020  . Pneumococcal Polysaccharide-23 11/07/2015  . Tdap 09/15/2009, 10/08/2019   Mammogram: 07/05/20. She has an appt to have right breat imaging repeated. Colonoscopy: 08/09/19. DEXA: N/A Hep C screening: 02/2018 NR.  Concerns/follow up today:  HLD: FLP was not done. She is on non pharmacologic treatment.  Lab Results  Component Value Date   CHOL 171 06/29/2019   HDL 43.70 06/29/2019   LDLCALC 110 (H) 06/29/2019   TRIG 86.0 06/29/2019   CHOLHDL 4 06/29/2019   HTN: She is on Amlodipine 10 mg daily. She is not checking BP at home. Negative for unusual/severe headache,CP,or weakness.  HypoK+: She is on KLOR 10 meq bid and eats a banana.  Lab Results  Component Value Date   CREATININE 0.73 07/10/2020   BUN 11 07/10/2020   NA 142 07/10/2020   K 3.3 (L) 07/10/2020   CL 106 07/10/2020   CO2 28 07/10/2020   She was using albuterol inh "a lot" because frequent exacerbations. Her immunologist recently changed her meds and she is doing better.  Review of Systems  Constitutional: Negative for appetite change, fatigue and fever.  HENT: Negative for hearing loss, mouth sores, sore throat and trouble swallowing.   Eyes: Negative for redness and visual  disturbance.  Respiratory: Negative for cough, shortness of breath and wheezing.   Cardiovascular: Negative for palpitations and leg swelling.  Gastrointestinal: Negative for abdominal pain, nausea and vomiting.       No changes in bowel habits.  Endocrine: Negative for cold intolerance, heat intolerance, polydipsia, polyphagia and polyuria.  Genitourinary: Negative for decreased urine volume, dysuria, hematuria, vaginal bleeding and vaginal discharge.  Musculoskeletal: Positive for arthralgias. Negative for gait problem and myalgias.  Skin: Negative for color change and rash.  Allergic/Immunologic: Positive for environmental allergies.  Neurological: Negative for syncope, facial asymmetry and weakness.  Hematological: Negative for adenopathy. Does not bruise/bleed easily.  Psychiatric/Behavioral: Negative for confusion. The patient is not nervous/anxious.   All other systems reviewed and are negative.  Current Outpatient Medications on File Prior to Visit  Medication Sig Dispense Refill  . albuterol (VENTOLIN HFA) 108 (90 Base) MCG/ACT inhaler Inhale 2 puffs every 6 hours as needed 54 g 4  . amLODipine (NORVASC) 10 MG tablet Take 1 tablet (10 mg total) by mouth daily. 90 tablet 3  . B Complex-C (B-COMPLEX WITH VITAMIN C) tablet Take 1 tablet by mouth daily.    Marland Kitchen CALCIUM CITRATE PO Take 2 tablets by mouth 2 (two) times daily.    . cholecalciferol (VITAMIN D) 1000 units tablet Take 2,000 Units by mouth daily.     Marland Kitchen CRANBERRY PO Take 1 tablet by mouth daily.    . fexofenadine (ALLEGRA) 180 MG tablet Take 180 mg by mouth daily.    . fluticasone (FLONASE) 50 MCG/ACT nasal spray USE 2 SPRAYS IN  EACH NOSTRIL DAILY AS NEEDED FOR ALLERGIES 48 g 3  . Fluticasone-Salmeterol (ADVAIR DISKUS) 500-50 MCG/DOSE AEPB Inhale 1 puff into the lungs in the morning and at bedtime. 180 each 3  . Lactase (LACTOSE INTOLERANCE PO) Take by mouth. Before eating each meal    . montelukast (SINGULAIR) 10 MG tablet TAKE  1 TABLET AT BEDTIME 90 tablet 3  . diphenhydrAMINE (BENADRYL) 25 MG tablet Take 1 tablet (25 mg total) by mouth every 6 (six) hours for 1 day. 4 tablet 0   No current facility-administered medications on file prior to visit.   Past Medical History:  Diagnosis Date  . Allergy   . Arthritis    fingers  . Asthma   . Hypertension   . Sleep apnea    cpap    Past Surgical History:  Procedure Laterality Date  . BREAST BIOPSY Right   . PARTIAL HYSTERECTOMY  2005  . TRIGGER FINGER RELEASE    . wisdom teeth      Allergies  Allergen Reactions  . Amoxicillin Hives  . Bactrim [Sulfamethoxazole-Trimethoprim] Swelling    Anaphylaxis   . Clindamycin/Lincomycin Swelling    Anaphylaxis   . Hydrocodone     Nausea vomiting  . Keflex [Cephalexin] Swelling    Anaphylaxis   . Lisinopril     headache    Family History  Problem Relation Age of Onset  . Hyperlipidemia Mother   . Hypertension Mother   . Hypertension Father   . Hyperlipidemia Father   . Diabetes Sister   . Cancer Sister        breast  . Breast cancer Sister   . Diabetes Sister   . Cancer Sister        breast  . Breast cancer Sister   . Breast cancer Maternal Aunt   . Colon cancer Neg Hx   . Colon polyps Neg Hx   . Esophageal cancer Neg Hx   . Rectal cancer Neg Hx   . Stomach cancer Neg Hx     Social History   Socioeconomic History  . Marital status: Married    Spouse name: Not on file  . Number of children: Not on file  . Years of education: Not on file  . Highest education level: Not on file  Occupational History  . Not on file  Tobacco Use  . Smoking status: Never Smoker  . Smokeless tobacco: Never Used  Vaping Use  . Vaping Use: Never used  Substance and Sexual Activity  . Alcohol use: No  . Drug use: No  . Sexual activity: Yes  Other Topics Concern  . Not on file  Social History Narrative  . Not on file   Social Determinants of Health   Financial Resource Strain:   . Difficulty of  Paying Living Expenses: Not on file  Food Insecurity:   . Worried About Charity fundraiser in the Last Year: Not on file  . Ran Out of Food in the Last Year: Not on file  Transportation Needs:   . Lack of Transportation (Medical): Not on file  . Lack of Transportation (Non-Medical): Not on file  Physical Activity:   . Days of Exercise per Week: Not on file  . Minutes of Exercise per Session: Not on file  Stress:   . Feeling of Stress : Not on file  Social Connections:   . Frequency of Communication with Friends and Family: Not on file  . Frequency of Social Gatherings with Friends and  Family: Not on file  . Attends Religious Services: Not on file  . Active Member of Clubs or Organizations: Not on file  . Attends Archivist Meetings: Not on file  . Marital Status: Not on file   Vitals:   07/14/20 0950  BP: 126/80  Pulse: 96  Resp: 16  SpO2: 98%   Body mass index is 37.38 kg/m.  Wt Readings from Last 3 Encounters:  07/14/20 204 lb 6 oz (92.7 kg)  04/24/20 (!) 207 lb 6.4 oz (94.1 kg)  02/22/20 206 lb 8 oz (93.7 kg)   Physical Exam Vitals and nursing note reviewed.  Constitutional:      General: She is not in acute distress.    Appearance: She is well-developed.  HENT:     Head: Normocephalic and atraumatic.     Right Ear: Hearing, tympanic membrane, ear canal and external ear normal.     Left Ear: Hearing, tympanic membrane, ear canal and external ear normal.     Mouth/Throat:     Mouth: Mucous membranes are moist.     Pharynx: Oropharynx is clear. Uvula midline.  Eyes:     Extraocular Movements: Extraocular movements intact.     Conjunctiva/sclera: Conjunctivae normal.     Pupils: Pupils are equal, round, and reactive to light.  Neck:     Trachea: No tracheal deviation.  Cardiovascular:     Rate and Rhythm: Normal rate and regular rhythm.     Pulses:          Dorsalis pedis pulses are 2+ on the right side and 2+ on the left side.     Heart sounds: No  murmur heard.   Pulmonary:     Effort: Pulmonary effort is normal. No respiratory distress.     Breath sounds: Normal breath sounds.  Abdominal:     Palpations: Abdomen is soft. There is no hepatomegaly or mass.     Tenderness: There is no abdominal tenderness.  Genitourinary:    Comments: Breast: No masses,nipple discharge,or skin changes bilateral. Musculoskeletal:     Comments: No signs of synovitis appreciated.  Lymphadenopathy:     Cervical: No cervical adenopathy.     Upper Body:     Right upper body: No axillary adenopathy.     Left upper body: No axillary adenopathy.  Skin:    General: Skin is warm.     Findings: No erythema or rash.  Neurological:     Mental Status: She is alert and oriented to person, place, and time.     Cranial Nerves: No cranial nerve deficit.     Coordination: Coordination normal.     Gait: Gait normal.     Deep Tendon Reflexes:     Reflex Scores:      Bicep reflexes are 2+ on the right side and 2+ on the left side.      Patellar reflexes are 2+ on the right side and 2+ on the left side. Psychiatric:        Speech: Speech normal.     Comments: Well groomed, good eye contact.   ASSESSMENT AND PLAN:  Ms. Haneefah Venturini was here today annual physical examination.  Orders Placed This Encounter  Procedures  . Lipid panel  . Potassium   Routine general medical examination at a health care facility We discussed the importance of regular physical activity and healthy diet for prevention of chronic illness and/or complications. Preventive guidelines reviewed. Vaccination up to date.  Ca++ and vit D  supplementation recommended. Next CPE in a year.  Hypertension, essential, benign BP adequately controlled. Continue Amlodipine 10 mg daily. Continue low salt diet and monitoring BP regularly.  Class 2 obesity with body mass index (BMI) of 37.0 to 37.9 in adult She has lost a couple of pounds since her last visit. We discussed benefits of  wt loss as well as adverse effects of obesity. Consistency with healthy diet and physical activity recommended.  Hypokalemia Mildly low. Could be related to increased Albuterol inh use. Other possible causes discussed. Continue KLOR 20 meq daily, changed from 10 meq x 2 to 20 meq x 1.   Dyslipidemia (high LDL; low HDL) Continue non pharmacologic treatment. Will have FLP check with next labs.   Return in about 1 year (around 07/14/2021) for cpe and f/u. Labs in 3-4 weeks.  Citlali Gautney G. Martinique, MD  [facility]. [place] office.   Today you have you routine preventive visit. A few things to remember from today's visit:  Hypertension, essential, benign  Dyslipidemia (high LDL; low HDL) - Plan: Lipid panel  Routine general medical examination at a health care facility  Hypokalemia - Plan: Potassium  Albuterol inh could have contribute to low potassium. We will repeat labs in 3-4 weeks, fasting.  If you need refills please call your pharmacy. Do not use My Chart to request refills or for acute issues that need immediate attention.    Please be sure medication list is accurate. If a new problem present, please set up appointment sooner than planned today.  At least 150 minutes of moderate exercise per week, daily brisk walking for 15-30 min is a good exercise option. Healthy diet low in saturated (animal) fats and sweets and consisting of fresh fruits and vegetables, lean meats such as fish and white chicken and whole grains.  These are some of recommendations for screening depending of age and risk factors:  - Vaccines:  Tdap vaccine every 10 years.  Shingles vaccine recommended at age 23, could be given after 57 years of age but not sure about insurance coverage.   Pneumonia vaccines: Pneumovax at 41. Sometimes Pneumovax is giving earlier if history of smoking, lung disease,diabetes,kidney disease among some.  Screening for diabetes at age 37 and every 3  years.  Cervical cancer prevention:  Pap smear starts at 57 years of age and continues periodically until 57 years old in low risk women. Pap smear every 3 years between 66 and 63 years old. Pap smear every 3-5 years between women 43 and older if pap smear negative and HPV screening negative.   -Breast cancer: Mammogram: There is disagreement between experts about when to start screening in low risk asymptomatic female but recent recommendations are to start screening at 24 and not later than 57 years old , every 1-2 years and after 57 yo q 2 years. Screening is recommended until 57 years old but some women can continue screening depending of healthy issues.  Colon cancer screening: Has been recently changed to 57 yo. Insurance may not cover until you are 57 years old. Screening is recommended until 57 years old.  Cholesterol disorder screening at age 41 and every 3 years.  Also recommended:  1. Dental visit- Brush and floss your teeth twice daily; visit your dentist twice a year. 2. Eye doctor- Get an eye exam at least every 2 years. 3. Helmet use- Always wear a helmet when riding a bicycle, motorcycle, rollerblading or skateboarding. 4. Safe sex- If you may be exposed  to sexually transmitted infections, use a condom. 5. Seat belts- Seat belts can save your live; always wear one. 6. Smoke/Carbon Monoxide detectors- These detectors need to be installed on the appropriate level of your home. Replace batteries at least once a year. 7. Skin cancer- When out in the sun please cover up and use sunscreen 15 SPF or higher. 8. Violence- If anyone is threatening or hurting you, please tell your healthcare provider.  9. Drink alcohol in moderation- Limit alcohol intake to one drink or less per day. Never drink and drive. 10. Calcium supplementation 1000 to 1200 mg daily, ideally through your diet.  Vitamin D supplementation 800 units daily.

## 2020-07-14 NOTE — Assessment & Plan Note (Signed)
Mildly low. Could be related to increased Albuterol inh use. Other possible causes discussed. Continue KLOR 20 meq daily, changed from 10 meq x 2 to 20 meq x 1.

## 2020-07-14 NOTE — Assessment & Plan Note (Signed)
BP adequately controlled. Continue Amlodipine 10 mg daily. Continue low salt diet and monitoring BP regularly.

## 2020-07-17 ENCOUNTER — Encounter: Payer: Self-pay | Admitting: Family Medicine

## 2020-07-17 ENCOUNTER — Encounter: Payer: 59 | Admitting: Family Medicine

## 2020-07-18 ENCOUNTER — Other Ambulatory Visit: Payer: Self-pay

## 2020-07-18 MED ORDER — POTASSIUM CHLORIDE CRYS ER 20 MEQ PO TBCR
20.0000 meq | EXTENDED_RELEASE_TABLET | Freq: Every day | ORAL | 3 refills | Status: DC
Start: 2020-07-18 — End: 2021-07-09

## 2020-08-14 ENCOUNTER — Other Ambulatory Visit: Payer: Self-pay

## 2020-08-14 ENCOUNTER — Other Ambulatory Visit (INDEPENDENT_AMBULATORY_CARE_PROVIDER_SITE_OTHER): Payer: 59

## 2020-08-14 DIAGNOSIS — E876 Hypokalemia: Secondary | ICD-10-CM

## 2020-08-14 DIAGNOSIS — E785 Hyperlipidemia, unspecified: Secondary | ICD-10-CM

## 2020-08-15 LAB — LIPID PANEL
Cholesterol: 171 mg/dL (ref <200)
HDL: 44 mg/dL — ABNORMAL LOW (ref >=50)
LDL Cholesterol (Calc): 108 mg/dL (calc) — ABNORMAL HIGH
Non-HDL Cholesterol (Calc): 127 mg/dL (calc) (ref <130)
Total CHOL/HDL Ratio: 3.9 (calc) (ref <5.0)
Triglycerides: 92 mg/dL (ref <150)

## 2020-08-15 LAB — POTASSIUM: Potassium: 3.7 mmol/L (ref 3.5–5.3)

## 2020-08-18 ENCOUNTER — Ambulatory Visit (INDEPENDENT_AMBULATORY_CARE_PROVIDER_SITE_OTHER): Payer: 59 | Admitting: Internal Medicine

## 2020-08-18 ENCOUNTER — Encounter: Payer: Self-pay | Admitting: Internal Medicine

## 2020-08-18 ENCOUNTER — Other Ambulatory Visit: Payer: Self-pay

## 2020-08-18 VITALS — BP 124/76 | HR 88 | Ht 62.0 in | Wt 205.5 lb

## 2020-08-18 DIAGNOSIS — E042 Nontoxic multinodular goiter: Secondary | ICD-10-CM

## 2020-08-18 NOTE — Progress Notes (Signed)
Name: Wanda Thornton  MRN/ DOB: [id number], [birth year]    Age/ Sex: 59 y.o., female     PCP: Martinique, Betty G, MD   Reason for Endocrinology Evaluation: MNG     Initial Endocrinology Clinic Visit: 08/12/2020    PATIENT IDENTIFIER: Wanda Thornton is a 59 y.o., female with a past medical history of HTN, OSA on CPAP and dyslipidemia . She has followed with [facility] since 08/12/2020 for consultative assistance with management of her MNG.   HISTORICAL SUMMARY:  Pt noted to have thyromegaly during an examination in 06/2019, which prompted a thyroid ultrasound demonstrating MNG.  She is S/P FNA of the 1.8 cm left inferior nodule with benign cytology on 09/01/2019   Denies radiation exposure   NO FH of thyroid disease  SUBJECTIVE:     Today (08/18/2020):  Wanda Thornton is here for a follow up on MNG  Weight has been stable  Denies anxiety or diarrhea  Denies hand tremors.    Denies local neck swelling, has dry mouth that she attributes to CPAP machine   HISTORY:  Past Medical History:  Past Medical History:  Diagnosis Date  . Allergy   . Arthritis    fingers  . Asthma   . Hypertension   . Sleep apnea    cpap    Past Surgical History:  Past Surgical History:  Procedure Laterality Date  . BREAST BIOPSY Right   . PARTIAL HYSTERECTOMY  2005  . TRIGGER FINGER RELEASE    . wisdom teeth       Social History:  reports that she has never smoked. She has never used smokeless tobacco. She reports that she does not drink alcohol and does not use drugs. Family History:  Family History  Problem Relation Age of Onset  . Hyperlipidemia Mother   . Hypertension Mother   . Hypertension Father   . Hyperlipidemia Father   . Diabetes Sister   . Cancer Sister        breast  . Breast cancer Sister   . Diabetes Sister   . Cancer Sister        breast  . Breast cancer Sister   . Breast cancer Maternal Aunt   . Colon cancer Neg Hx   .  Colon polyps Neg Hx   . Esophageal cancer Neg Hx   . Rectal cancer Neg Hx   . Stomach cancer Neg Hx       HOME MEDICATIONS: Allergies as of 08/18/2020      Reactions   Amoxicillin Hives   Bactrim [sulfamethoxazole-trimethoprim] Swelling   Anaphylaxis    Clindamycin/lincomycin Swelling   Anaphylaxis    Hydrocodone    Nausea vomiting   Keflex [cephalexin] Swelling   Anaphylaxis    Lisinopril    headache      Medication List       Accurate as of August 18, 2020  7:32 AM. If you have any questions, ask your nurse or doctor.        albuterol 108 (90 Base) MCG/ACT inhaler Commonly known as: VENTOLIN HFA Inhale 2 puffs every 6 hours as needed   amLODipine 10 MG tablet Commonly known as: NORVASC Take 1 tablet (10 mg total) by mouth daily.   B-complex with vitamin C tablet Take 1 tablet by mouth daily.   CALCIUM CITRATE PO Take 2 tablets by mouth 2 (two) times daily.   cholecalciferol 1000 units tablet Commonly known as: VITAMIN D Take 2,000 Units by  mouth daily.   CRANBERRY PO Take 1 tablet by mouth daily.   diphenhydrAMINE 25 MG tablet Commonly known as: BENADRYL Take 1 tablet (25 mg total) by mouth every 6 (six) hours for 1 day.   fexofenadine 180 MG tablet Commonly known as: ALLEGRA Take 180 mg by mouth daily.   fluticasone 50 MCG/ACT nasal spray Commonly known as: FLONASE USE 2 SPRAYS IN EACH NOSTRIL DAILY AS NEEDED FOR ALLERGIES   Fluticasone-Salmeterol 500-50 MCG/DOSE Aepb Commonly known as: Advair Diskus Inhale 1 puff into the lungs in the morning and at bedtime.   LACTOSE INTOLERANCE PO Take by mouth. Before eating each meal   montelukast 10 MG tablet Commonly known as: SINGULAIR TAKE 1 TABLET AT BEDTIME   potassium chloride SA 20 MEQ tablet Commonly known as: KLOR-CON Take 1 tablet (20 mEq total) by mouth daily.         OBJECTIVE:   PHYSICAL EXAM: VS: BP 124/76   Pulse 88   Ht 5\' 2"  (1.575 m)   Wt 205 lb 8 oz (93.2 kg)    SpO2 98%   BMI 37.59 kg/m    EXAM: General: Pt appears well and is in NAD  Neck: General: Supple without adenopathy. Thyroid: Thyroid size normal.  No goiter or nodules appreciated. No thyroid bruit.  Lungs: Clear with good BS bilat with no rales, rhonchi, or wheezes  Heart: Auscultation: RRR.  Abdomen: Normoactive bowel sounds, soft, nontender, without masses or organomegaly palpable  Extremities:  BL LE: No pretibial edema normal ROM and strength.  Mental Status: Judgment, insight: Intact Orientation: Oriented to time, place, and person Memory: Intact for recent and remote events Mood and affect: No depression, anxiety, or agitation     DATA REVIEWED:  Results for Wanda Thornton, Wanda Thornton (MRN [id number]) as of 08/18/2020 07:34  Ref. Range 07/10/2020 08:56  TSH Latest Ref Range: 0.40 - 4.50 mIU/L 2.13     FNA 09/01/2019 Clinical History: Nodule 2 Left Inferior 1.8 cm; other 2 dimensions: 1.3  x 0.8 cm, Solid / almost completely solid, Hypoechoic, ACR TI-RADS total  points: 4, Moderately suspicious nodule  Specimen Submitted: A. THYROID, LT LOBE LLP, FINE NEEDLE ASPIRATION:    FINAL MICROSCOPIC DIAGNOSIS:  - Consistent with benign follicular nodule (Bethesda category II)   ASSESSMENT / PLAN / RECOMMENDATIONS:   1. MNG:  - No local neck symptoms - She is clinically and biochemically euthyroid  - S/P benign FNA of the left inferior 1.8 cm nodule  - Will repeat thyroid ultrasound this year   F/U in 1 yr    Signed electronically by: Mack Guise, MD  [facility]  [facility] [address] [place], [place] [address] Phone: [phone] FAX: [phone]      CC: Martinique, Betty G, [address] [address] Phone: [phone]  Fax: [phone]   Return to Endocrinology clinic as below: Future Appointments  Date Time Provider [place]  10/25/2020 10:00 AM Baird Lyons D, MD  [facility] None  07/17/2021  8:30 AM Martinique, Betty G, MD [id number]

## 2020-08-18 NOTE — Patient Instructions (Signed)
-   An order for a repeat ultrasound has been ordered

## 2020-09-04 ENCOUNTER — Other Ambulatory Visit: Payer: 59

## 2020-09-05 ENCOUNTER — Ambulatory Visit
Admission: RE | Admit: 2020-09-05 | Discharge: 2020-09-05 | Disposition: A | Discharge status: Home / Self Care | Payer: 59 | Source: Ambulatory Visit | Attending: Internal Medicine | Admitting: Internal Medicine

## 2020-09-05 DIAGNOSIS — E042 Nontoxic multinodular goiter: Secondary | ICD-10-CM

## 2020-09-11 IMAGING — US US THYROID
1 series · 13 of 25 positions shown · non-contrast
Comparison: None.

CLINICAL DATA: Right-sided thyroid nodule

EXAM:
THYROID ULTRASOUND
TECHNIQUE: Ultrasound examination of the thyroid gland and adjacent soft
tissues was performed.

[Series 1: us thyroid · 0.05mm/px · 13 of 47 slices shown]
[im 1/47]
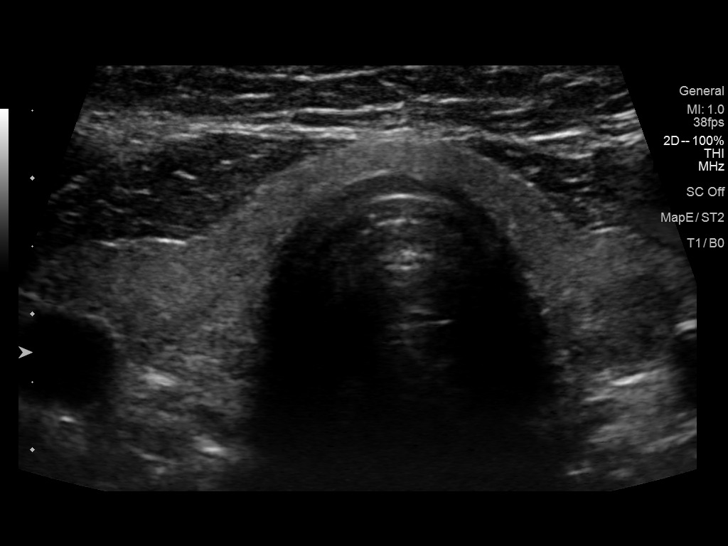
[im 4/47]
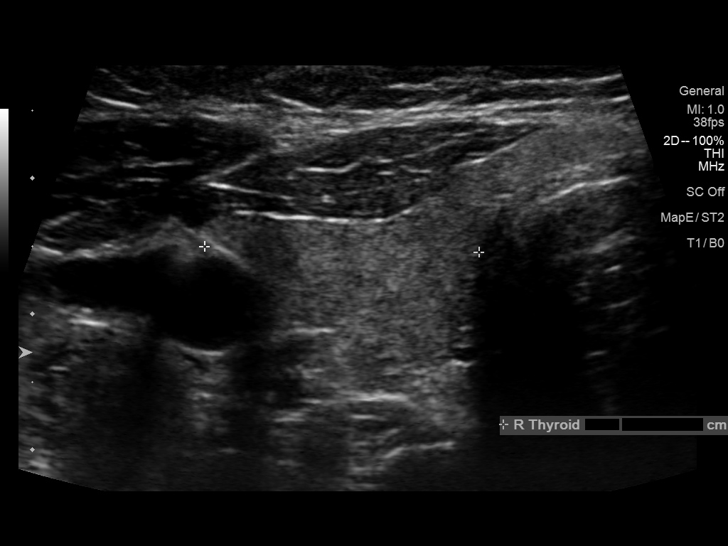
[im 8/47]
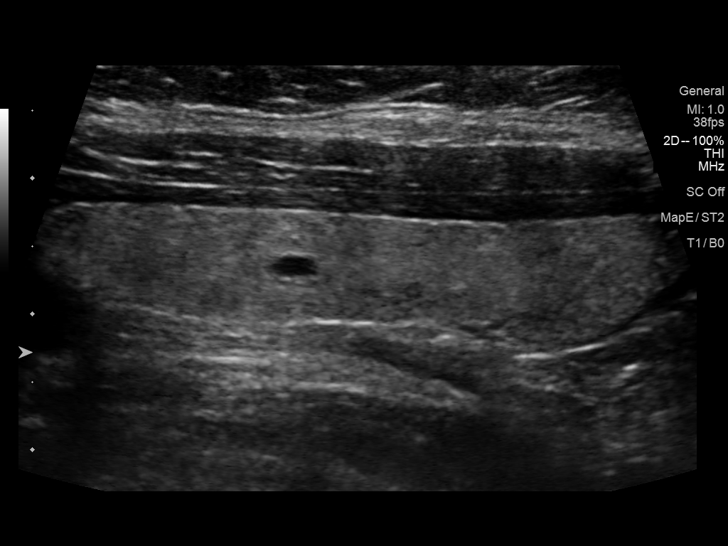
[im 12/47]
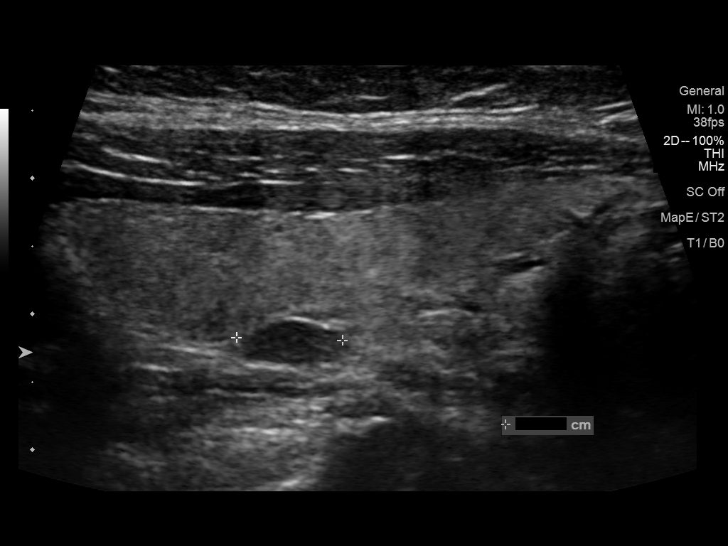
[im 16/47]
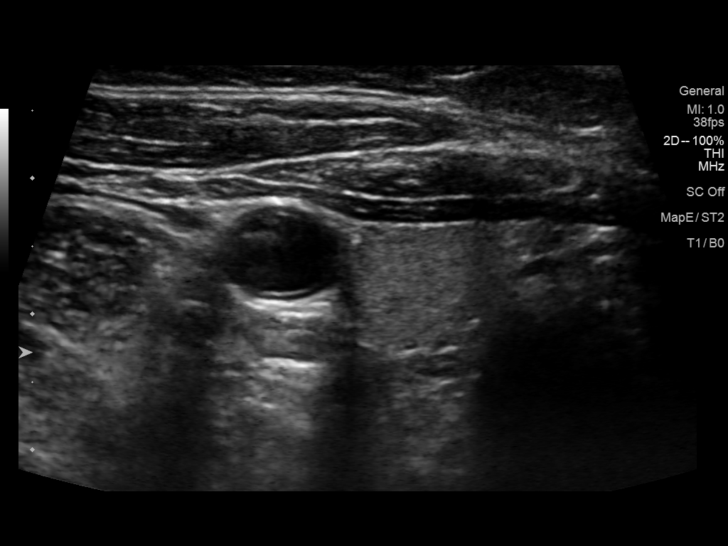
[im 20/47]
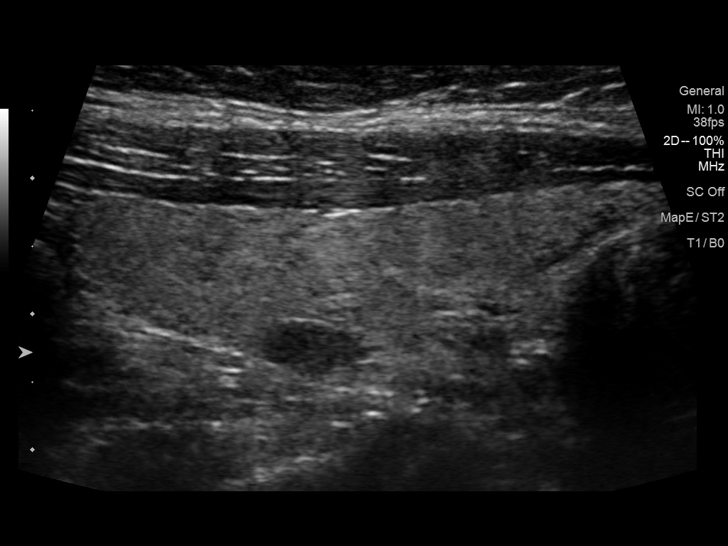
[im 24/47]
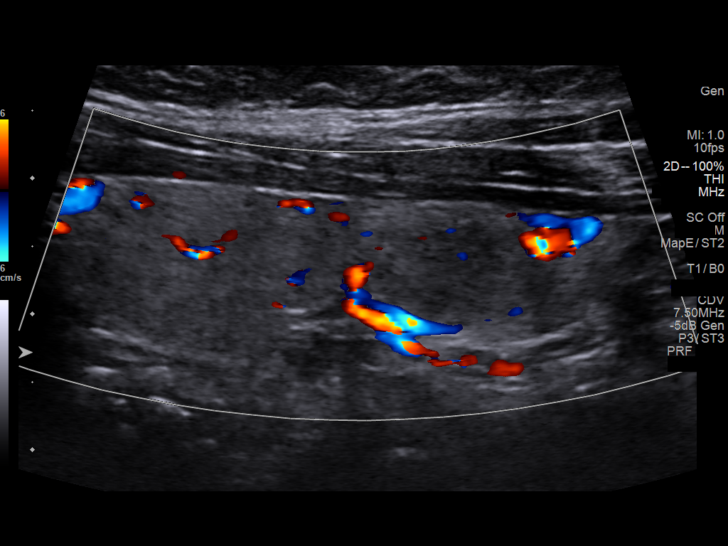
[im 27/47]
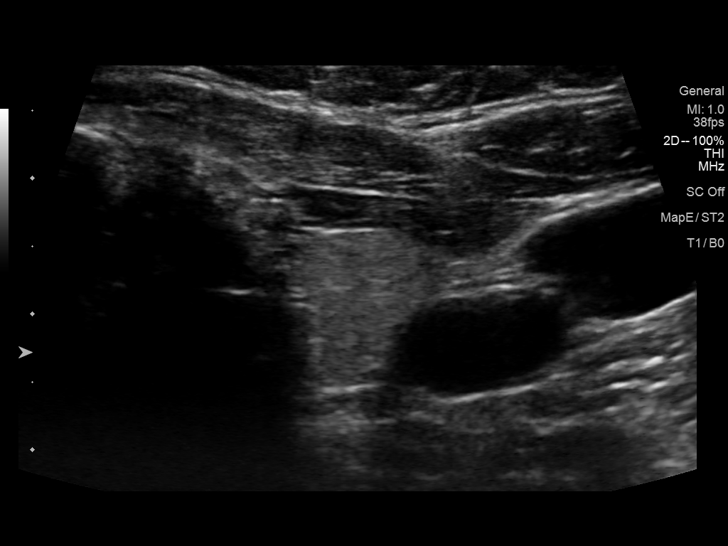
[im 31/47]
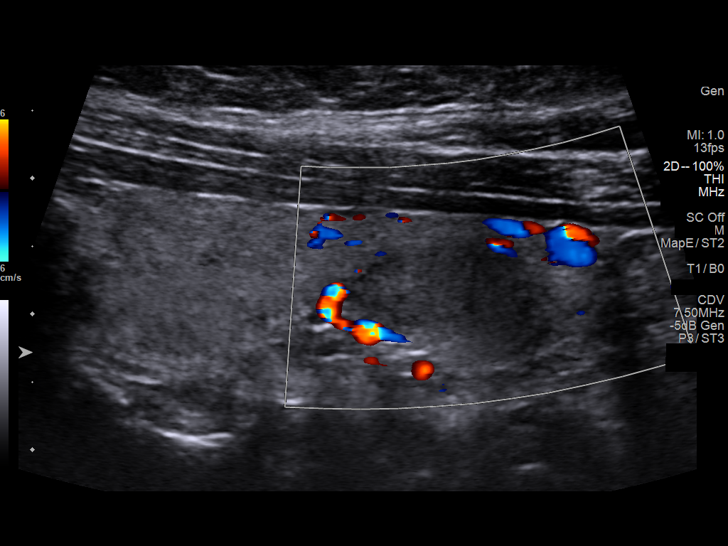
[im 35/47]
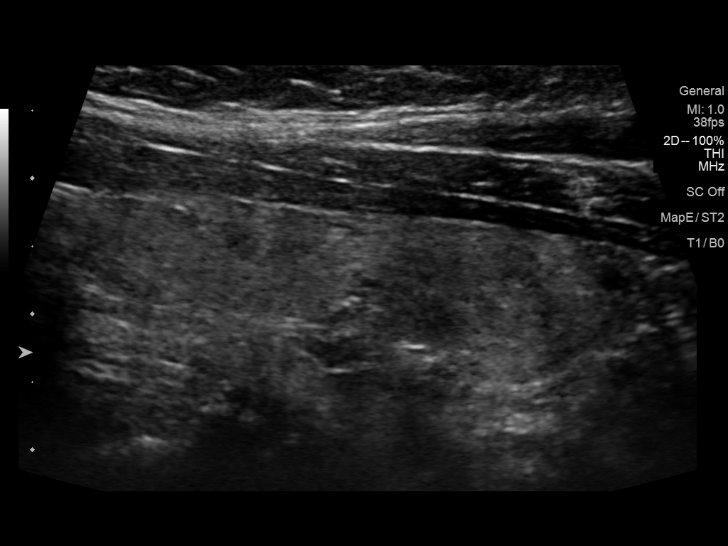
[im 39/47]
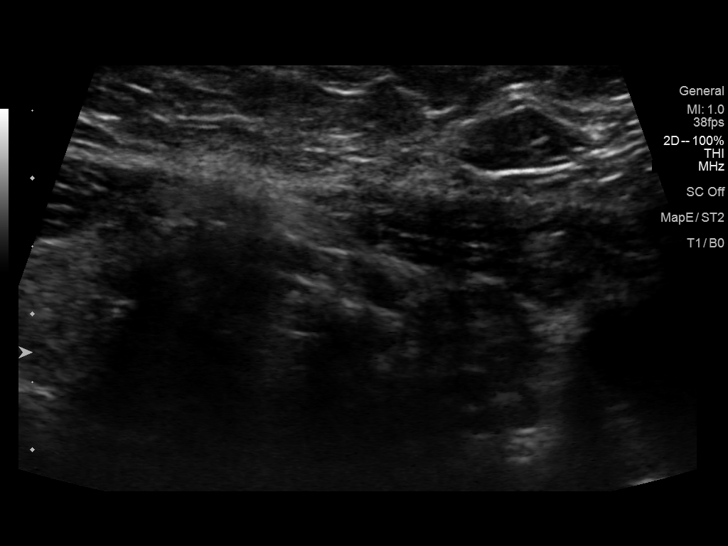
[im 43/47]
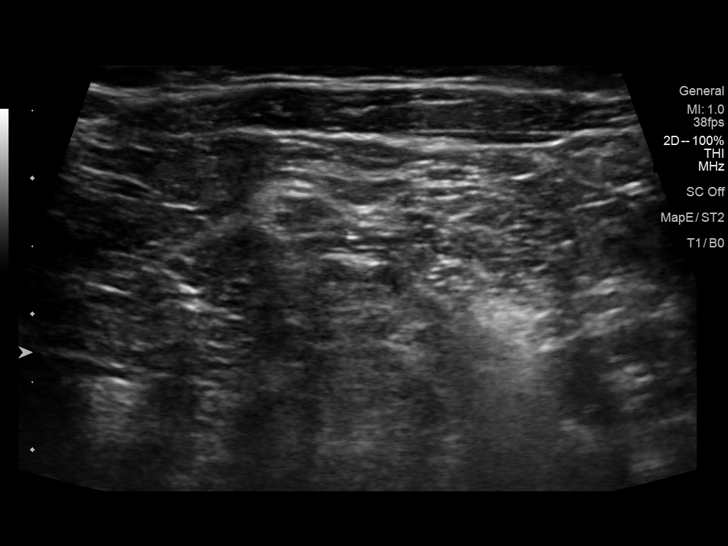
[im 47/47]
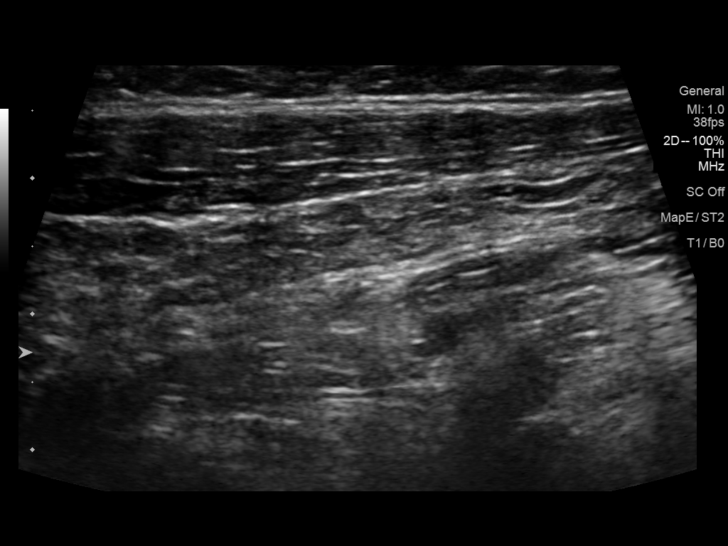

[13 of 25 positions shown; findings below may reference images not displayed]

FINDINGS: Parenchymal Echotexture: Normal

Isthmus: 0.3 cm

Right lobe: 4.8 x 0.9 x 2 cm

Left lobe: 4.5 x 1.6 x 1.3 cm

_________________________________________________________

Estimated total number of nodules >/= 1 cm: 1

Number of spongiform nodules >/=  2 cm not described below (TR1): 0

Number of mixed cystic and solid nodules >/= 1.5 cm not described
below (TR2): 0

_________________________________________________________

Nodule # 1:

Location: Right; Mid

Maximum size: 0.8 cm; Other 2 dimensions: 0.6 x 0.4 cm

Composition: solid/almost completely solid (2)

Echogenicity: hypoechoic (2)

Shape: not taller-than-wide (0)

Margins: smooth (0)

Echogenic foci: none (0)

ACR TI-RADS total points: 4.

ACR TI-RADS risk category: TR4 (4-6 points).

ACR TI-RADS recommendations:

Given size (<0.9 cm) and appearance, this nodule does NOT meet
TI-RADS criteria for biopsy or dedicated follow-up.

_________________________________________________________

Nodule # 2:

Location: Left; Inferior

Maximum size: 1.8 cm; Other 2 dimensions: 1.3 x 0.8 cm

Composition: solid/almost completely solid (2)

Echogenicity: hypoechoic (2)

Shape: not taller-than-wide (0)

Margins: smooth (0)

Echogenic foci: none (0)

ACR TI-RADS total points: 4.

ACR TI-RADS risk category: TR4 (4-6 points).

ACR TI-RADS recommendations:

**Given size (>/= 1.5 cm) and appearance, fine needle aspiration of
this moderately suspicious nodule should be considered based on
TI-RADS criteria.

_________________________________________________________
IMPRESSION: There is a 1.8 cm nodule in the left inferior thyroid gland.
Findings meet consensus criteria for biopsy. Ultrasound-guided fine
needle aspiration should be considered, as per the consensus
statement: Management of Thyroid Nodules Detected at US: Society of
Radiologists in Ultrasound Consensus Conference Statement. Radiology
2331; [DATE].

The above is in keeping with the ACR TI-RADS recommendations - [HOSPITAL] 2842;[DATE].

## 2020-10-24 NOTE — Progress Notes (Signed)
HPI female never smoker followed for asthma, allergic rhinitis,  OSA Allergy Profile 02/13/17- total IgE 66, specific elevations for Timothy grass and Elm pollen. Eosinophils normal 0.3 K/UL [facility], [place]/22/15-[facility]I 11.8/hour, desaturation to 82%, body weight 167 pounds  ------------------------------------------------------------------------------   12/16/19- 60 year old female never smoker followed for asthma, allergic rhinitis,  OSA CPAP auto 5-15/ [employer] Daily Advair, infrequent rescue. Going for Covax today CPAP auto 5-15/ [facility] Download compliance 100%, AHI 0.8/ hr Body weight today 204 lbs Machine is 59 year old, working well. Sleeping well. Walk in park aggravates rhinitis. Some seasonal variation. Advair controls well. Rare need for rescue inhaler.  10/25/20- 60 year old female never smoker followed for Asthma, Allergic Rhinitis,  OSA, complicated by Obesity, HTN, Thyroid nodules,  -Allegra, Advair 500, Albuterol hfa, Flonase, Singulair, CPAP auto 5-15/ [facility] Download- compliance 100%, AHI 0.9/ hr Body weight today-206 lbs Covid vax- 3 Moderna Flu vax-had Seen by Volanda Napoleon, NP for asthma in July with IgE 73, EOS 400 ACT score- 21 -----Patient has been waking up in a coughing fit and getting mucus up about twice a week. Otherwise feeling good, Sleeping good, machine working good.  Yellow sputum, then clear. Says Advair 500 is much better than the 250. Rescue hfa 1x/ week. Denies nasal symptoms now. Cold air triggers wheeze.  ROS-see HPI    "+" = positive  Constitutional:    weight loss, night sweats, fevers, chills, fatigue, lassitude. HEENT:    headaches, difficulty swallowing, tooth/dental problems, sore throat,       + sneezing, +itching, ear ache,  +nasal congestion, post nasal drip, snoring CV:    chest pain, orthopnea, PND, swelling in lower extremities, anasarca,                                                      dizziness, palpitations Resp:   + shortness  of breath with exertion or at rest.                +productive cough,   non-productive cough, coughing up of blood.              change in color of mucus.  +wheezing.   Skin:    rash or lesions. GI:  No-   heartburn, indigestion, abdominal pain, nausea, vomiting, diarrhea,                 change in bowel habits, loss of appetite GU: dysuria, change in color of urine, no urgency or frequency.   flank pain. MS:   joint pain, stiffness, decreased range of motion, back pain. Neuro-     nothing unusual Psych:  change in mood or affect.  depression or anxiety.   memory loss.  OBJ- Physical Exam   General- Alert, Oriented, Affect-appropriate, Distress- none acute, + overweight Skin- rash-none, lesions- none, excoriation- none Lymphadenopathy- none Head- atraumatic            Eyes- Gross vision intact, PERRLA, conjunctivae and secretions clear            Ears- Hearing, canals-normal            Nose- + turbinate edema, no-Septal dev, mucus, polyps, erosion, perforation             Throat- Mallampati III-IV , mucosa clear , drainage- none, tonsils- atrophic Neck- flexible , trachea  midline, no stridor , thyroid nl, carotid no bruit Chest - symmetrical excursion , unlabored           Heart/CV- RRR , no murmur , no gallop  , no rub, nl s1 s2                           - JVD- none , edema- none, stasis changes- none, varices- none           Lung- clear to P&A, wheeze- none, cough- none , dullness-none, rub- none           Chest wall-  Abd-  Br/ Gen/ Rectal- Not done, not indicated Extrem- cyanosis- none, clubbing, none, atrophy- none, strength- nl Neuro- grossly intact to observation

## 2020-10-25 ENCOUNTER — Ambulatory Visit (INDEPENDENT_AMBULATORY_CARE_PROVIDER_SITE_OTHER): Payer: 59

## 2020-10-25 ENCOUNTER — Other Ambulatory Visit: Payer: Self-pay | Admitting: *Deleted

## 2020-10-25 ENCOUNTER — Other Ambulatory Visit: Payer: Self-pay

## 2020-10-25 ENCOUNTER — Encounter: Payer: Self-pay | Admitting: Internal Medicine

## 2020-10-25 ENCOUNTER — Ambulatory Visit (INDEPENDENT_AMBULATORY_CARE_PROVIDER_SITE_OTHER): Payer: 59 | Admitting: Internal Medicine

## 2020-10-25 VITALS — BP 120/72 | HR 96 | Temp 98.2°F | Ht 62.0 in | Wt 206.4 lb

## 2020-10-25 DIAGNOSIS — J4540 Moderate persistent asthma, uncomplicated: Secondary | ICD-10-CM

## 2020-10-25 DIAGNOSIS — J849 Interstitial pulmonary disease, unspecified: Secondary | ICD-10-CM

## 2020-10-25 DIAGNOSIS — Z9989 Dependence on other enabling machines and devices: Secondary | ICD-10-CM

## 2020-10-25 DIAGNOSIS — G4733 Obstructive sleep apnea (adult) (pediatric): Secondary | ICD-10-CM | POA: Diagnosis not present

## 2020-10-25 NOTE — Patient Instructions (Addendum)
Order CXR   Dx Asthma moderate persistent  Order- referral to Dr Augustina Mood, DDS   Consider oral appliance as alternative to CPAP for OSA  We can continue current meds  We can continue CPAP auto 5-15  Please call if we can help

## 2020-10-26 ENCOUNTER — Other Ambulatory Visit (INDEPENDENT_AMBULATORY_CARE_PROVIDER_SITE_OTHER): Payer: 59

## 2020-10-26 DIAGNOSIS — J849 Interstitial pulmonary disease, unspecified: Secondary | ICD-10-CM | POA: Diagnosis not present

## 2020-10-26 LAB — CBC WITH DIFFERENTIAL/PLATELET
Basophils Absolute: 0.1 10*3/uL (ref 0.0–0.1)
Basophils Relative: 0.9 % (ref 0.0–3.0)
Eosinophils Absolute: 0.2 10*3/uL (ref 0.0–0.7)
Eosinophils Relative: 2.4 % (ref 0.0–5.0)
HCT: 43.8 % (ref 36.0–46.0)
Hemoglobin: 15.4 g/dL — ABNORMAL HIGH (ref 12.0–15.0)
Lymphocytes Relative: 25.1 % (ref 12.0–46.0)
Lymphs Abs: 2.3 10*3/uL (ref 0.7–4.0)
MCHC: 35.1 g/dL (ref 30.0–36.0)
MCV: 90.4 fl (ref 78.0–100.0)
Monocytes Absolute: 0.5 10*3/uL (ref 0.1–1.0)
Monocytes Relative: 5.6 % (ref 3.0–12.0)
Neutro Abs: 6 10*3/uL (ref 1.4–7.7)
Neutrophils Relative %: 66 % (ref 43.0–77.0)
Platelets: 317 10*3/uL (ref 150.0–400.0)
RBC: 4.85 Mil/uL (ref 3.87–5.11)
RDW: 13.7 % (ref 11.5–15.5)
WBC: 9.1 10*3/uL (ref 4.0–10.5)

## 2020-10-26 LAB — BASIC METABOLIC PANEL
BUN: 11 mg/dL (ref 6–23)
CO2: 31 mEq/L (ref 19–32)
Calcium: 9.6 mg/dL (ref 8.4–10.5)
Chloride: 103 mEq/L (ref 96–112)
Creatinine, Ser: 0.72 mg/dL (ref 0.40–1.20)
GFR: 92.99 mL/min (ref >60.00)
Glucose, Bld: 135 mg/dL — ABNORMAL HIGH (ref 70–99)
Potassium: 3.3 mEq/L — ABNORMAL LOW (ref 3.5–5.1)
Sodium: 140 mEq/L (ref 135–145)

## 2020-10-26 LAB — BRAIN NATRIURETIC PEPTIDE: Pro B Natriuretic peptide (BNP): 15 pg/mL (ref 0.0–100.0)

## 2020-10-30 LAB — HYPERSENSITIVITY PNEUMONITIS
A. Pullulans Abs: NEGATIVE
A.Fumigatus #1 Abs: NEGATIVE
Micropolyspora faeni, IgG: NEGATIVE
Pigeon Serum Abs: NEGATIVE
Thermoact. Saccharii: NEGATIVE
Thermoactinomyces vulgaris, IgG: NEGATIVE

## 2020-10-31 ENCOUNTER — Other Ambulatory Visit: Payer: Self-pay

## 2020-10-31 ENCOUNTER — Ambulatory Visit (INDEPENDENT_AMBULATORY_CARE_PROVIDER_SITE_OTHER)
Admission: RE | Admit: 2020-10-31 | Discharge: 2020-10-31 | Disposition: A | Discharge status: Home / Self Care | Payer: 59 | Source: Ambulatory Visit | Attending: Internal Medicine | Admitting: Internal Medicine

## 2020-10-31 DIAGNOSIS — J849 Interstitial pulmonary disease, unspecified: Secondary | ICD-10-CM

## 2020-11-11 ENCOUNTER — Other Ambulatory Visit: Payer: Self-pay | Admitting: Family Medicine

## 2020-11-11 DIAGNOSIS — J4540 Moderate persistent asthma, uncomplicated: Secondary | ICD-10-CM

## 2020-12-11 NOTE — Assessment & Plan Note (Signed)
Better controlled on Advair 500. Cold air triggerss occ wheeze. Plan- CXR, continue current meds

## 2020-12-11 NOTE — Assessment & Plan Note (Signed)
Benefits from CPAP with good ccompliance and cocntrol Plan- continue auto 5-15

## 2020-12-15 ENCOUNTER — Ambulatory Visit: Payer: 59 | Admitting: Internal Medicine

## 2021-01-12 ENCOUNTER — Other Ambulatory Visit: Payer: Self-pay | Admitting: Family Medicine

## 2021-01-12 DIAGNOSIS — I10 Essential (primary) hypertension: Secondary | ICD-10-CM

## 2021-04-29 IMAGING — DX DG SHOULDER 2+V*L*
3 series · 3 of 3 positions shown · non-contrast
Comparison: None.

CLINICAL DATA: Left shoulder pain for 1 month without known injury.

EXAM:
LEFT SHOULDER - 2+ VIEW

[grashey]
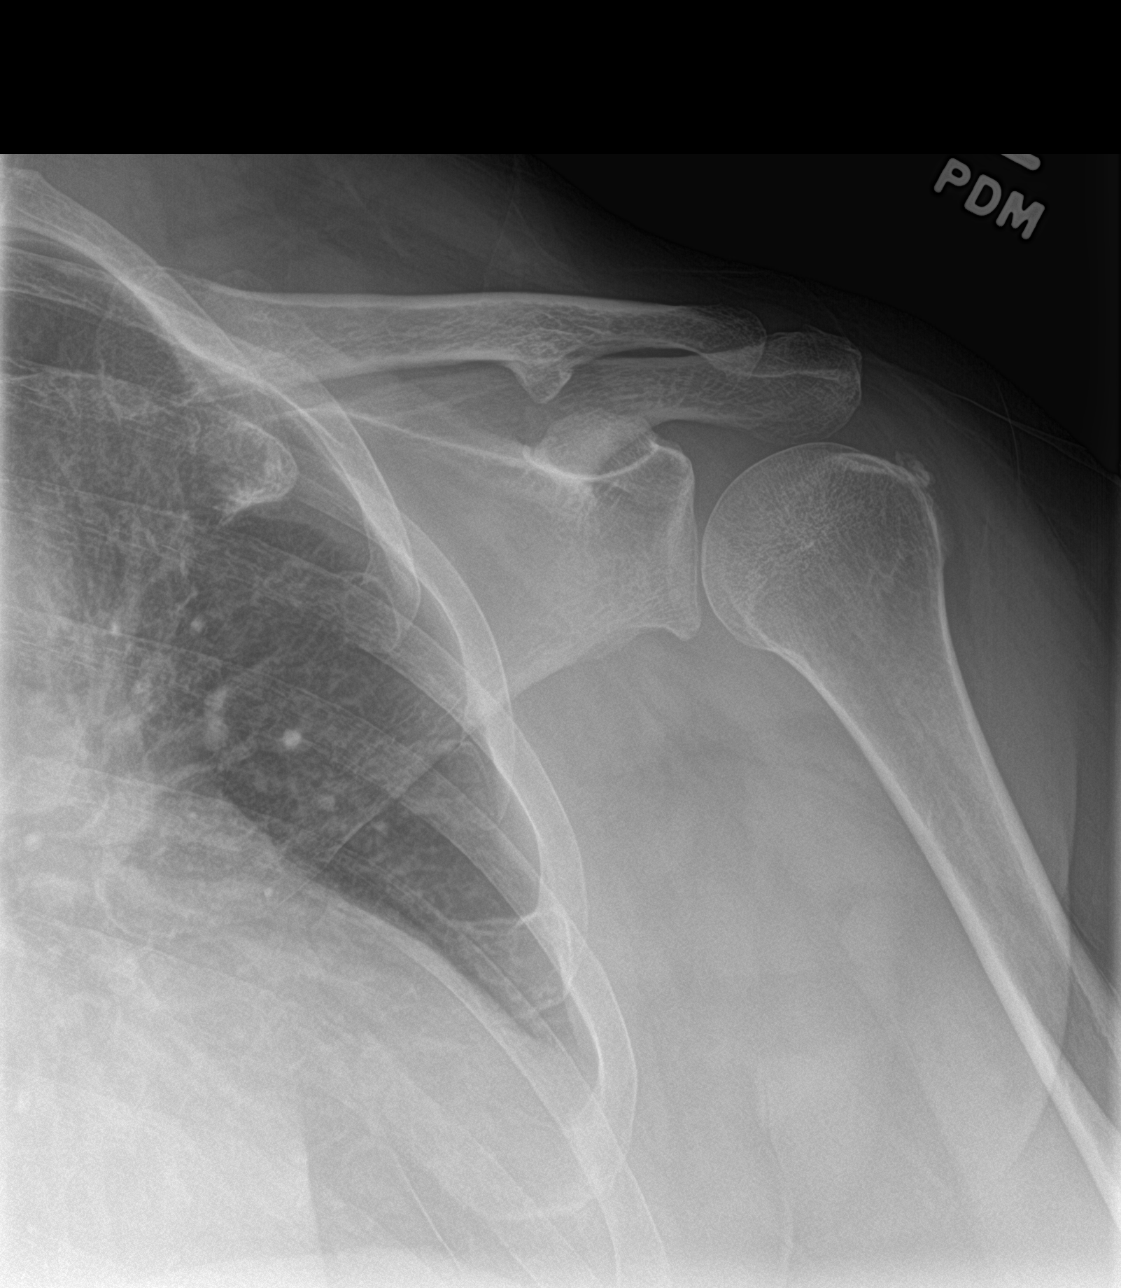

[y view]
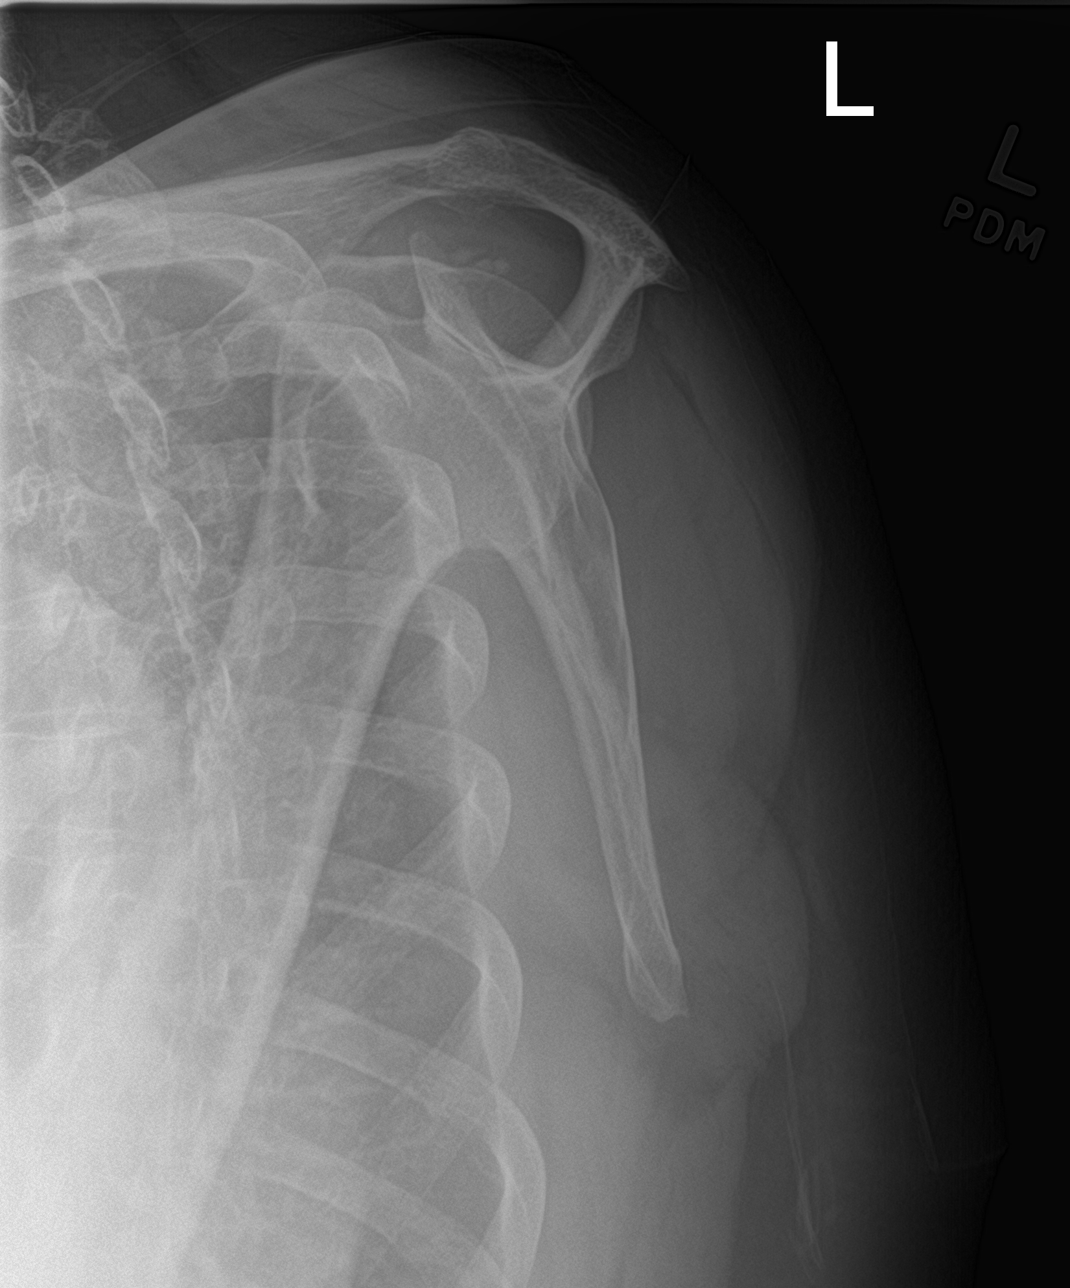

[shoulder axial]
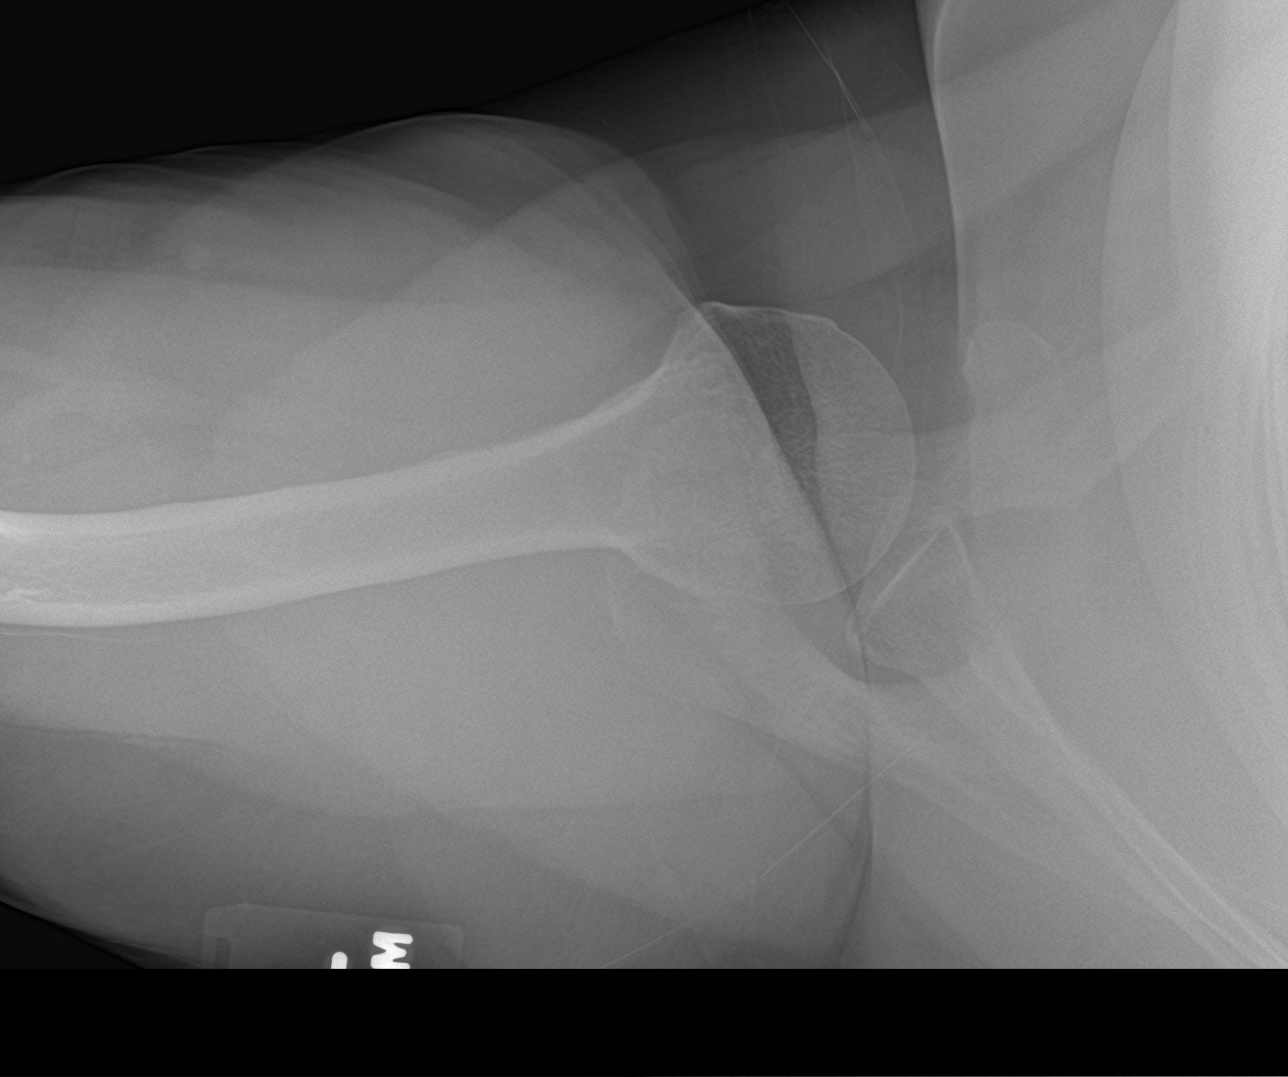

[3 of 3 positions shown; findings below may reference images not displayed]

FINDINGS: There is no evidence of fracture or dislocation. There is no
evidence of arthropathy. Calcification is seen over greater
tuberosity of proximal humerus suggesting calcific tendinosis. Soft
tissues are unremarkable.
IMPRESSION: Calcification seen over greater tuberosity of proximal humerus
suggesting calcific tendinosis. No fracture or dislocation is noted.

## 2021-05-14 ENCOUNTER — Other Ambulatory Visit: Payer: Self-pay | Admitting: Primary Care

## 2021-06-03 ENCOUNTER — Other Ambulatory Visit: Payer: Self-pay | Admitting: Family Medicine

## 2021-06-03 DIAGNOSIS — J4540 Moderate persistent asthma, uncomplicated: Secondary | ICD-10-CM

## 2021-06-14 ENCOUNTER — Other Ambulatory Visit: Payer: Self-pay | Admitting: Family Medicine

## 2021-06-14 DIAGNOSIS — Z1231 Encounter for screening mammogram for malignant neoplasm of breast: Secondary | ICD-10-CM

## 2021-07-09 ENCOUNTER — Other Ambulatory Visit: Payer: Self-pay | Admitting: Family Medicine

## 2021-07-16 NOTE — Progress Notes (Signed)
HPI: [name] is a 60 y.o. female, who is here today for her routine physical and follow up.  Last CPE: 07/14/20  Regular exercise 3 or more time per week: yes, walking 5 times per week, 3-4 miles. Following a healthy diet: yes, she is cooking at home. Good amount of vegetables. She lives with her husband.  Chronic medical problems: OSA on CPAP, HTN,vit D deficiency,hypoK+,and HLD among some. She follows with endocrinologist and pulmonologist for multiple thyroid nodules and asthma/I interstitial pulmonary disease/OSA respectively.  Pap smear: S/P hysterectomy Hx of abnormal pap smears: Negative.  Immunization History  Administered Date(s) Administered   Influenza,inj,Quad PF,6+ Mos 10/16/1998, 06/17/2017, 06/17/2018, 06/29/2019, 07/10/2020   Moderna Sars-Covid-2 Vaccination 12/16/2019, 01/18/2020, 08/05/2020, 02/22/2021   Pneumococcal Polysaccharide-23 11/07/2015   Tdap 09/15/2009, 10/08/2019   Mammogram: 07/14/2020. Upcoming on 07/19/21 Colonoscopy: 08/09/2019 DEXA: N/A She takes ca++ and vit D supplementation. Hep C screening: 02/2018 NR.  She has no new concerns today. Albuterol inh 1-2 times per months. Asthma is usually worse around this time of the year.  HTN: She is on Amlodipine 10 mg daily. Negative for CP,SOB,or focal weakness. She does not check BP at home.  Hypokalemia: Currently she is on K-Lor 20 mEq daily.  Lab Results  Component Value Date   CREATININE 0.72 10/26/2020   BUN 11 10/26/2020   NA 140 10/26/2020   K 3.3 (L) 10/26/2020   CL 103 10/26/2020   CO2 31 10/26/2020   HLD: She is on non pharmacologic treatemnt. Lab Results  Component Value Date   CHOL 171 08/14/2020   HDL 44 (L) 08/14/2020   LDLCALC 108 (H) 08/14/2020   TRIG 92 08/14/2020   CHOLHDL 3.9 08/14/2020  Vitamin D insufficiency: She is on daily vitamin D supplementation, not sure about dose. Last 25 OH vitamin D in 06/2020 was normal at 47.  Review of Systems   Constitutional:  Negative for appetite change, fatigue and fever.  HENT:  Negative for hearing loss, mouth sores, trouble swallowing and voice change.   Eyes:  Negative for redness and visual disturbance.  Respiratory:  Negative for cough, shortness of breath and wheezing.   Cardiovascular:  Negative for palpitations and leg swelling.  Gastrointestinal:  Negative for abdominal pain, nausea and vomiting.       No changes in bowel habits.  Endocrine: Negative for cold intolerance, heat intolerance, polydipsia, polyphagia and polyuria.  Genitourinary:  Negative for decreased urine volume, dysuria, hematuria, vaginal bleeding and vaginal discharge.  Musculoskeletal:  Negative for gait problem and myalgias.  Skin:  Negative for color change and rash.  Allergic/Immunologic: Positive for environmental allergies.  Neurological:  Negative for syncope, facial asymmetry and headaches.  Hematological:  Negative for adenopathy. Does not bruise/bleed easily.  Psychiatric/Behavioral:  Negative for confusion. The patient is not nervous/anxious.   All other systems reviewed and are negative.  Current Outpatient Medications on File Prior to Visit  Medication Sig Dispense Refill   ADVAIR DISKUS 500-50 MCG/ACT AEPB USE 1 INHALATION IN THE MORNING AND AT BEDTIME 180 each 1   albuterol (VENTOLIN HFA) 108 (90 Base) MCG/ACT inhaler USE 2 INHALATIONS EVERY 4 TO 6 HOURS AS NEEDED FOR DYSPNEA AND WHEEZING 25.5 g 3   amLODipine (NORVASC) 10 MG tablet TAKE 1 TABLET DAILY 90 tablet 3   B Complex-C (B-COMPLEX WITH VITAMIN C) tablet Take 1 tablet by mouth daily.     CALCIUM CITRATE PO Take 2 tablets by mouth 2 (two) times daily.  cholecalciferol (VITAMIN D) 1000 units tablet Take 2,000 Units by mouth daily.      CRANBERRY PO Take 1 tablet by mouth daily.     fexofenadine (ALLEGRA) 180 MG tablet Take 180 mg by mouth daily.     fluticasone (FLONASE) 50 MCG/ACT nasal spray USE 2 SPRAYS IN EACH NOSTRIL DAILY AS NEEDED  FOR ALLERGIES 48 g 3   KLOR-CON M20 20 MEQ tablet TAKE 1 TABLET DAILY (CHANGED FROM 10 MEQ) 90 tablet 0   montelukast (SINGULAIR) 10 MG tablet TAKE 1 TABLET AT BEDTIME 90 tablet 3   diphenhydrAMINE (BENADRYL) 25 MG tablet Take 1 tablet (25 mg total) by mouth every 6 (six) hours for 1 day. 4 tablet 0   No current facility-administered medications on file prior to visit.     Past Medical History:  Diagnosis Date   Allergy    Arthritis    fingers   Asthma    Hypertension    Sleep apnea    cpap   Past Surgical History:  Procedure Laterality Date   BREAST BIOPSY Right    PARTIAL HYSTERECTOMY  2005   TRIGGER FINGER RELEASE     wisdom teeth     Allergies  Allergen Reactions   Amoxicillin Hives   Bactrim [Sulfamethoxazole-Trimethoprim] Swelling    Anaphylaxis    Clindamycin/Lincomycin Swelling    Anaphylaxis    Hydrocodone     Nausea vomiting   Keflex [Cephalexin] Swelling    Anaphylaxis    Lisinopril     headache    Family History  Problem Relation Age of Onset   Hyperlipidemia Mother    Hypertension Mother    Hypertension Father    Hyperlipidemia Father    Diabetes Sister    Cancer Sister        breast   Breast cancer Sister    Diabetes Sister    Cancer Sister        breast   Breast cancer Sister    Breast cancer Maternal Aunt    Colon cancer Neg Hx    Colon polyps Neg Hx    Esophageal cancer Neg Hx    Rectal cancer Neg Hx    Stomach cancer Neg Hx    Social History   Socioeconomic History   Marital status: Married    Spouse name: Not on file   Number of children: Not on file   Years of education: Not on file   Highest education level: Not on file  Occupational History   Not on file  Tobacco Use   Smoking status: Never    Passive exposure: Yes   Smokeless tobacco: Never  Vaping Use   Vaping Use: Never used  Substance and Sexual Activity   Alcohol use: No   Drug use: No   Sexual activity: Yes  Other Topics Concern   Not on file  Social  History Narrative   Not on file   Social Determinants of Health   Financial Resource Strain: Not on file  Food Insecurity: Not on file  Transportation Needs: Not on file  Physical Activity: Not on file  Stress: Not on file  Social Connections: Not on file   Vitals:   07/17/21 0828  BP: 124/70  Pulse: 98  Resp: 16  SpO2: 97%   Body mass index is 36.12 kg/m.  Wt Readings from Last 3 Encounters:  07/17/21 197 lb 8 oz (89.6 kg)  10/25/20 206 lb 6.4 oz (93.6 kg)  08/18/20 205 lb 8 oz (93.2  kg)   Physical Exam Vitals and nursing note reviewed.  Constitutional:      General: She is not in acute distress.    Appearance: She is well-developed.  HENT:     Head: Normocephalic and atraumatic.     Right Ear: Hearing, tympanic membrane, ear canal and external ear normal.     Left Ear: Hearing, tympanic membrane, ear canal and external ear normal.     Mouth/Throat:     Mouth: Mucous membranes are moist.     Pharynx: Oropharynx is clear. Uvula midline.  Eyes:     Extraocular Movements: Extraocular movements intact.     Conjunctiva/sclera: Conjunctivae normal.     Pupils: Pupils are equal, round, and reactive to light.  Neck:     Thyroid: Thyromegaly present.     Trachea: No tracheal deviation.  Cardiovascular:     Rate and Rhythm: Normal rate and regular rhythm.     Pulses:          Dorsalis pedis pulses are 2+ on the right side and 2+ on the left side.     Heart sounds: No murmur heard. Pulmonary:     Effort: Pulmonary effort is normal. No respiratory distress.     Breath sounds: Normal breath sounds.  Abdominal:     Palpations: Abdomen is soft. There is no hepatomegaly or mass.     Tenderness: There is no abdominal tenderness.  Genitourinary:    Comments: No concerns today. Musculoskeletal:     Comments: No major deformity or signs of synovitis appreciated.  Lymphadenopathy:     Cervical: No cervical adenopathy.     Upper Body:     Right upper body: No  supraclavicular adenopathy.     Left upper body: No supraclavicular adenopathy.  Skin:    General: Skin is warm.     Findings: No erythema or rash.  Neurological:     General: No focal deficit present.     Mental Status: She is alert and oriented to person, place, and time.     Cranial Nerves: No cranial nerve deficit.     Coordination: Coordination normal.     Gait: Gait normal.     Deep Tendon Reflexes:     Reflex Scores:      Bicep reflexes are 2+ on the right side and 2+ on the left side.      Patellar reflexes are 2+ on the right side and 2+ on the left side. Psychiatric:        Speech: Speech normal.     Comments: Well groomed, good eye contact.   ASSESSMENT AND PLAN:  [name] was here today annual physical examination and follow up.  Orders Placed This Encounter  Procedures   Comprehensive metabolic panel   Lipid panel   Hemoglobin A1c   VITAMIN D 25 Hydroxy (Vit-D Deficiency, Fractures)   Lab Results  Component Value Date   HGBA1C 4.6 07/17/2021   Lab Results  Component Value Date   CREATININE 0.69 07/17/2021   BUN 12 07/17/2021   NA 143 07/17/2021   K 3.3 (L) 07/17/2021   CL 106 07/17/2021   CO2 28 07/17/2021   Lab Results  Component Value Date   ALT 24 07/17/2021   AST 22 07/17/2021   ALKPHOS 94 07/17/2021   BILITOT 0.9 07/17/2021   Routine general medical examination at a health care facility We discussed the importance of regular physical activity and healthy diet for prevention of chronic illness and/or  complications. Preventive guidelines reviewed. Vaccination: Because she recently had her coffee 19 booster, she will come back in 2 weeks to get her influenza and shingles vaccine.  Ca++ and vit D supplementation to continue Next CPE in a year.  The 10-year ASCVD risk score (Arnett DK, et al., 2019) is: 5.4%   Values used to calculate the score:     Age: 70 years     Sex: Female     Is Non-Hispanic African American: Yes      Diabetic: No     Tobacco smoker: No     Systolic Blood Pressure: 416 mmHg     Is BP treated: Yes     HDL Cholesterol: 40.9 mg/dL     Total Cholesterol: 165 mg/dL  Screening for endocrine, metabolic and immunity disorder -     Hemoglobin A1c -     Comprehensive metabolic panel  Vitamin D deficiency, unspecified Continue same dose of vit D, will adjust treatment according to 25 OH vit D result.  Hypokalemia Continue KLOR 20 meq daily. Further recommendations according to K+ result.  Hypertension, essential, benign BP adequately controlled. Continue current management: Amlodipine 10 mg dialy. DASH/low salt diet to continue. Monitor BP at home. Eye exam recommended annually.  Dyslipidemia (high LDL; low HDL) Non pharmacologic treatment recommended for now. Further recommendations will be given according to 10 years CVD risk score and lipid panel numbers.  Class 2 obesity with body mass index (BMI) of 37.0 to 37.9 in adult She has lost about 7 Lb since her last CPE. She understands the benefits of wt loss as well as adverse effects of obesity. Consistency with healthy diet and physical activity encouraged.  Return in 1 year (on 07/17/2022) for CPE.  Monterio Bob G. Martinique, MD  [facility] [place] office.

## 2021-07-17 ENCOUNTER — Ambulatory Visit (INDEPENDENT_AMBULATORY_CARE_PROVIDER_SITE_OTHER): Payer: 59 | Admitting: Family Medicine

## 2021-07-17 ENCOUNTER — Other Ambulatory Visit: Payer: Self-pay

## 2021-07-17 ENCOUNTER — Encounter: Payer: Self-pay | Admitting: Family Medicine

## 2021-07-17 VITALS — BP 124/70 | HR 98 | Resp 16 | Ht 62.0 in | Wt 197.5 lb

## 2021-07-17 DIAGNOSIS — E559 Vitamin D deficiency, unspecified: Secondary | ICD-10-CM

## 2021-07-17 DIAGNOSIS — Z130 Encounter for screening for diseases of the blood and blood-forming organs and certain disorders involving the immune mechanism: Secondary | ICD-10-CM

## 2021-07-17 DIAGNOSIS — Z1329 Encounter for screening for other suspected endocrine disorder: Secondary | ICD-10-CM

## 2021-07-17 DIAGNOSIS — E876 Hypokalemia: Secondary | ICD-10-CM

## 2021-07-17 DIAGNOSIS — Z6837 Body mass index (BMI) 37.0-37.9, adult: Secondary | ICD-10-CM

## 2021-07-17 DIAGNOSIS — I10 Essential (primary) hypertension: Secondary | ICD-10-CM | POA: Diagnosis not present

## 2021-07-17 DIAGNOSIS — Z0000 Encounter for general adult medical examination without abnormal findings: Secondary | ICD-10-CM

## 2021-07-17 DIAGNOSIS — Z13228 Encounter for screening for other metabolic disorders: Secondary | ICD-10-CM

## 2021-07-17 DIAGNOSIS — E785 Hyperlipidemia, unspecified: Secondary | ICD-10-CM

## 2021-07-17 LAB — VITAMIN D 25 HYDROXY (VIT D DEFICIENCY, FRACTURES): VITD: 41.44 ng/mL (ref 30.00–100.00)

## 2021-07-17 LAB — COMPREHENSIVE METABOLIC PANEL
ALT: 24 U/L (ref 0–35)
AST: 22 U/L (ref 0–37)
Albumin: 4.3 g/dL (ref 3.5–5.2)
Alkaline Phosphatase: 94 U/L (ref 39–117)
BUN: 12 mg/dL (ref 6–23)
CO2: 28 mEq/L (ref 19–32)
Calcium: 9.4 mg/dL (ref 8.4–10.5)
Chloride: 106 mEq/L (ref 96–112)
Creatinine, Ser: 0.69 mg/dL (ref 0.40–1.20)
GFR: 96.03 mL/min (ref >60.00)
Glucose, Bld: 93 mg/dL (ref 70–99)
Potassium: 3.3 mEq/L — ABNORMAL LOW (ref 3.5–5.1)
Sodium: 143 mEq/L (ref 135–145)
Total Bilirubin: 0.9 mg/dL (ref 0.2–1.2)
Total Protein: 7.5 g/dL (ref 6.0–8.3)

## 2021-07-17 LAB — LIPID PANEL
Cholesterol: 165 mg/dL (ref 0–200)
HDL: 40.9 mg/dL (ref >39.00)
LDL Cholesterol: 106 mg/dL — ABNORMAL HIGH (ref 0–99)
NonHDL: 124.38
Total CHOL/HDL Ratio: 4
Triglycerides: 93 mg/dL (ref 0.0–149.0)
VLDL: 18.6 mg/dL (ref 0.0–40.0)

## 2021-07-17 LAB — HEMOGLOBIN A1C: Hgb A1c MFr Bld: 4.6 % (ref 4.6–6.5)

## 2021-07-17 NOTE — Assessment & Plan Note (Signed)
BP adequately controlled. Continue current management: Amlodipine 10 mg dialy. DASH/low salt diet to continue. Monitor BP at home. Eye exam recommended annually.

## 2021-07-17 NOTE — Patient Instructions (Addendum)
A few things to remember from today's visit:  Routine general medical examination at a health care facility  Hypertension, essential, benign  Dyslipidemia (high LDL; low HDL) - Plan: Lipid panel  Screening for endocrine, metabolic and immunity disorder - Plan: Comprehensive metabolic panel, Hemoglobin A1c  Vitamin D deficiency, unspecified - Plan: VITAMIN D 25 Hydroxy (Vit-D Deficiency, Fractures)  If you need refills please call your pharmacy. Do not use My Chart to request refills or for acute issues that need immediate attention.   Please be sure medication list is accurate. If a new problem present, please set up appointment sooner than planned today.  Health Maintenance, Female Adopting a healthy lifestyle and getting preventive care are important in promoting health and wellness. Ask your health care provider about: The right schedule for you to have regular tests and exams. Things you can do on your own to prevent diseases and keep yourself healthy. What should I know about diet, weight, and exercise? Eat a healthy diet  Eat a diet that includes plenty of vegetables, fruits, low-fat dairy products, and lean protein. Do not eat a lot of foods that are high in solid fats, added sugars, or sodium. Maintain a healthy weight Body mass index (BMI) is used to identify weight problems. It estimates body fat based on height and weight. Your health care provider can help determine your BMI and help you achieve or maintain a healthy weight. Get regular exercise Get regular exercise. This is one of the most important things you can do for your health. Most adults should: Exercise for at least 150 minutes each week. The exercise should increase your heart rate and make you sweat (moderate-intensity exercise). Do strengthening exercises at least twice a week. This is in addition to the moderate-intensity exercise. Spend less time sitting. Even light physical activity can be  beneficial. Watch cholesterol and blood lipids Have your blood tested for lipids and cholesterol at 58 years of age, then have this test every 5 years. Have your cholesterol levels checked more often if: Your lipid or cholesterol levels are high. You are older than 58 years of age. You are at high risk for heart disease. What should I know about cancer screening? Depending on your health history and family history, you may need to have cancer screening at various ages. This may include screening for: Breast cancer. Cervical cancer. Colorectal cancer. Skin cancer. Lung cancer. What should I know about heart disease, diabetes, and high blood pressure? Blood pressure and heart disease High blood pressure causes heart disease and increases the risk of stroke. This is more likely to develop in people who have high blood pressure readings, are of African descent, or are overweight. Have your blood pressure checked: Every 3-5 years if you are 54-61 years of age. Every year if you are 33 years old or older. Diabetes Have regular diabetes screenings. This checks your fasting blood sugar level. Have the screening done: Once every three years after age 54 if you are at a normal weight and have a low risk for diabetes. More often and at a younger age if you are overweight or have a high risk for diabetes. What should I know about preventing infection? Hepatitis B If you have a higher risk for hepatitis B, you should be screened for this virus. Talk with your health care provider to find out if you are at risk for hepatitis B infection. Hepatitis C Testing is recommended for: Everyone born from [birth year] through 1963. Anyone with  known risk factors for hepatitis C. Sexually transmitted infections (STIs) Get screened for STIs, including gonorrhea and chlamydia, if: You are sexually active and are younger than 58 years of age. You are older than 58 years of age and your health care provider tells you  that you are at risk for this type of infection. Your sexual activity has changed since you were last screened, and you are at increased risk for chlamydia or gonorrhea. Ask your health care provider if you are at risk. Ask your health care provider about whether you are at high risk for HIV. Your health care provider may recommend a prescription medicine to help prevent HIV infection. If you choose to take medicine to prevent HIV, you should first get tested for HIV. You should then be tested every 3 months for as long as you are taking the medicine. Pregnancy If you are about to stop having your period (premenopausal) and you may become pregnant, seek counseling before you get pregnant. Take 400 to 800 micrograms (mcg) of folic acid every day if you become pregnant. Ask for birth control (contraception) if you want to prevent pregnancy. Osteoporosis and menopause Osteoporosis is a disease in which the bones lose minerals and strength with aging. This can result in bone fractures. If you are 69 years old or older, or if you are at risk for osteoporosis and fractures, ask your health care provider if you should: Be screened for bone loss. Take a calcium or vitamin D supplement to lower your risk of fractures. Be given hormone replacement therapy (HRT) to treat symptoms of menopause. Follow these instructions at home: Lifestyle Do not use any products that contain nicotine or tobacco, such as cigarettes, e-cigarettes, and chewing tobacco. If you need help quitting, ask your health care provider. Do not use street drugs. Do not share needles. Ask your health care provider for help if you need support or information about quitting drugs. Alcohol use Do not drink alcohol if: Your health care provider tells you not to drink. You are pregnant, may be pregnant, or are planning to become pregnant. If you drink alcohol: Limit how much you use to 0-1 drink a day. Limit intake if you are  breastfeeding. Be aware of how much alcohol is in your drink. In the [place] one drink equals one 12 oz bottle of beer (355 mL), one 5 oz glass of wine (148 mL), or one 1 oz glass of hard liquor (44 mL). General instructions Schedule regular health, dental, and eye exams. Stay current with your vaccines. Tell your health care provider if: You often feel depressed. You have ever been abused or do not feel safe at home. Summary Adopting a healthy lifestyle and getting preventive care are important in promoting health and wellness. Follow your health care provider's instructions about healthy diet, exercising, and getting tested or screened for diseases. Follow your health care provider's instructions on monitoring your cholesterol and blood pressure. This information is not intended to replace advice given to you by your health care provider. Make sure you discuss any questions you have with your health care provider. Document Revised: 11/24/2020 Document Reviewed: 09/09/2018 Elsevier Patient Education  2022 [employer].

## 2021-07-17 NOTE — Assessment & Plan Note (Signed)
She has lost about 7 Lb since her last CPE. She understands the benefits of wt loss as well as adverse effects of obesity. Consistency with healthy diet and physical activity encouraged.

## 2021-07-17 NOTE — Assessment & Plan Note (Signed)
Non pharmacologic treatment recommended for now. Further recommendations will be given according to 10 years CVD risk score and lipid panel numbers. 

## 2021-07-17 NOTE — Assessment & Plan Note (Signed)
Continue same dose of vit D, will adjust treatment according to 25 OH vit D result.

## 2021-07-17 NOTE — Assessment & Plan Note (Signed)
Continue KLOR 20 meq daily. Further recommendations according to K+ result. 

## 2021-07-19 ENCOUNTER — Ambulatory Visit
Admission: RE | Admit: 2021-07-19 | Discharge: 2021-07-19 | Disposition: A | Discharge status: Home / Self Care | Payer: 59 | Source: Ambulatory Visit | Attending: Family Medicine | Admitting: Family Medicine

## 2021-07-19 ENCOUNTER — Other Ambulatory Visit: Payer: Self-pay

## 2021-07-19 DIAGNOSIS — Z1231 Encounter for screening mammogram for malignant neoplasm of breast: Secondary | ICD-10-CM

## 2021-07-20 ENCOUNTER — Other Ambulatory Visit: Payer: Self-pay

## 2021-07-20 MED ORDER — SPIRONOLACTONE 25 MG PO TABS: 25.0000 mg | ORAL_TABLET | Freq: Every day | ORAL | 1 refills | Status: DC

## 2021-07-20 MED ORDER — AMLODIPINE BESYLATE 5 MG PO TABS: 5.0000 mg | ORAL_TABLET | Freq: Every day | ORAL | 3 refills | Status: DC

## 2021-07-23 ENCOUNTER — Ambulatory Visit (INDEPENDENT_AMBULATORY_CARE_PROVIDER_SITE_OTHER): Payer: 59 | Admitting: Family Medicine

## 2021-07-23 ENCOUNTER — Other Ambulatory Visit: Payer: Self-pay

## 2021-07-23 ENCOUNTER — Ambulatory Visit: Payer: 59

## 2021-07-23 ENCOUNTER — Telehealth: Payer: Self-pay

## 2021-07-23 ENCOUNTER — Encounter: Payer: Self-pay | Admitting: Family Medicine

## 2021-07-23 VITALS — BP 120/70 | HR 105 | Resp 16 | Ht 62.0 in | Wt 197.0 lb

## 2021-07-23 DIAGNOSIS — Z23 Encounter for immunization: Secondary | ICD-10-CM | POA: Diagnosis not present

## 2021-07-23 DIAGNOSIS — H02849 Edema of unspecified eye, unspecified eyelid: Secondary | ICD-10-CM

## 2021-07-23 DIAGNOSIS — I10 Essential (primary) hypertension: Secondary | ICD-10-CM

## 2021-07-23 DIAGNOSIS — E876 Hypokalemia: Secondary | ICD-10-CM

## 2021-07-23 MED ORDER — POTASSIUM CHLORIDE CRYS ER 20 MEQ PO TBCR: 20.0000 meq | EXTENDED_RELEASE_TABLET | Freq: Two times a day (BID) | ORAL | 2 refills | Status: DC

## 2021-07-23 MED ORDER — AMLODIPINE BESYLATE 10 MG PO TABS: 10.0000 mg | ORAL_TABLET | Freq: Every day | ORAL | 2 refills | Status: DC

## 2021-07-23 MED ORDER — TOBRAMYCIN-DEXAMETHASONE 0.3-0.1 % OP OINT: TOPICAL_OINTMENT | OPHTHALMIC | 0 refills | Status: AC

## 2021-07-23 NOTE — Assessment & Plan Note (Addendum)
BP adequately controlled. Stop Spironolactone due to question of allergic reaction and increase Amlodipine from 5 mg to 10 mg daily. Continue low salt diet and monitor BP at home.

## 2021-07-23 NOTE — Assessment & Plan Note (Addendum)
Chronic. Did not tolerate spironolactone, possible side effects/allergic reaction. KLOR was increased from 20 meq qd to bid. Lab in 2-3 weeks.

## 2021-07-23 NOTE — Telephone Encounter (Signed)
I called and spoke with patient. She started taking the Spironolactone on Saturday. She woke up about 3am with her eye itchy and swollen. She continued the medication yesterday, and the swelling was even worse. She took a benadryl and it helped some. Appointment made for 10am to see pcp.

## 2021-07-23 NOTE — Patient Instructions (Addendum)
A few things to remember from today's visit:  Edema of eyelid, unspecified laterality - Plan: tobramycin-dexamethasone (TOBRADEX) ophthalmic ointment  Hypertension, essential, benign - Plan: amLODipine (NORVASC) 10 MG tablet  Hypokalemia - Plan: potassium chloride SA (KLOR-CON M20) 20 MEQ tablet  If you need refills please call your pharmacy. Do not use My Chart to request refills or for acute issues that need immediate attention.   I am not sure of this was caused by Spironolactone but we are stopping it. Go back to Amlodipine 10 mg. Potassium increased to 2 times daily and we check potassium in 2-3 weeks.  Stop Allegra and try Zyrtec 10 mg 2 times daily for 10-14 days. Topical steroid on eye lids, small amount for 7 days. Monitor for new symptoms.  Please be sure medication list is accurate. If a new problem present, please set up appointment sooner than planned today.

## 2021-07-23 NOTE — Telephone Encounter (Signed)
Patient called stating that the medication spironolactone (ALDACTONE) 25 MG tablet  is giving her an allergic reaction and would like a call back to discuss

## 2021-07-23 NOTE — Progress Notes (Signed)
ACUTE VISIT Chief Complaint  Patient presents with   Facial Swelling    Eye started swelling and itching shortly after starting the Spirolactone.    HPI: [name] is a 60 y.o. female, who is here today complaining of palpebral edema, L>R as described above. Edema,soreness, and pruritus involve edges of eye lids, noted yesterday morning when she got up, initially right side. Mild conjunctival erythema.  She felt like her throat was swollen once while asleep and better after taking Benadryl. Negative for fever,chills,eye pain/discharge,periocular erythema or skin rash.  She has not noted sore throat,dysphonia,stridor,cough,wheezing, SOB,abdominal pain,N/V,or changes in bowel habits.  She was last seen on 07/17/21, when Spironolactone was added to help with hypoK+. Amlodipine dose decreased from 10 mg to 5 mg.  Lab Results  Component Value Date   CREATININE 0.69 07/17/2021   BUN 12 07/17/2021   NA 143 07/17/2021   K 3.3 (L) 07/17/2021   CL 106 07/17/2021   CO2 28 07/17/2021   HypoK+ on KLOR 20 meq daily.  Review of Systems  Constitutional:  Negative for activity change, appetite change and fatigue.  HENT:  Negative for congestion and rhinorrhea.   Genitourinary:  Negative for decreased urine volume and hematuria.  Musculoskeletal:  Negative for gait problem and myalgias.  Rest see pertinent positives and negatives per HPI.  Current Outpatient Medications on File Prior to Visit  Medication Sig Dispense Refill   ADVAIR DISKUS 500-50 MCG/ACT AEPB USE 1 INHALATION IN THE MORNING AND AT BEDTIME 180 each 1   albuterol (VENTOLIN HFA) 108 (90 Base) MCG/ACT inhaler USE 2 INHALATIONS EVERY 4 TO 6 HOURS AS NEEDED FOR DYSPNEA AND WHEEZING 25.5 g 3   B Complex-C (B-COMPLEX WITH VITAMIN C) tablet Take 1 tablet by mouth daily.     CALCIUM CITRATE PO Take 2 tablets by mouth 2 (two) times daily.     cholecalciferol (VITAMIN D) 1000 units tablet Take 2,000 Units by mouth  daily.      CRANBERRY PO Take 1 tablet by mouth daily.     fexofenadine (ALLEGRA) 180 MG tablet Take 180 mg by mouth daily.     fluticasone (FLONASE) 50 MCG/ACT nasal spray USE 2 SPRAYS IN EACH NOSTRIL DAILY AS NEEDED FOR ALLERGIES 48 g 3   montelukast (SINGULAIR) 10 MG tablet TAKE 1 TABLET AT BEDTIME 90 tablet 3   diphenhydrAMINE (BENADRYL) 25 MG tablet Take 1 tablet (25 mg total) by mouth every 6 (six) hours for 1 day. 4 tablet 0   No current facility-administered medications on file prior to visit.     Past Medical History:  Diagnosis Date   Allergy    Arthritis    fingers   Asthma    Hypertension    Sleep apnea    cpap   Allergies  Allergen Reactions   Amoxicillin Hives   Bactrim [Sulfamethoxazole-Trimethoprim] Swelling    Anaphylaxis    Clindamycin/Lincomycin Swelling    Anaphylaxis    Hydrocodone     Nausea vomiting   Keflex [Cephalexin] Swelling    Anaphylaxis    Lisinopril     headache    Social History   Socioeconomic History   Marital status: Married    Spouse name: Not on file   Number of children: Not on file   Years of education: Not on file   Highest education level: Not on file  Occupational History   Not on file  Tobacco Use   Smoking status: Never  Passive exposure: Yes   Smokeless tobacco: Never  Vaping Use   Vaping Use: Never used  Substance and Sexual Activity   Alcohol use: No   Drug use: No   Sexual activity: Yes  Other Topics Concern   Not on file  Social History Narrative   Not on file   Social Determinants of Health   Financial Resource Strain: Not on file  Food Insecurity: Not on file  Transportation Needs: Not on file  Physical Activity: Not on file  Stress: Not on file  Social Connections: Not on file    Vitals:   07/23/21 1000  BP: 120/70  Pulse: (!) 105  Resp: 16  SpO2: 99%   Body mass index is 36.03 kg/m.  Physical Exam Vitals and nursing note reviewed.  Constitutional:      General: She is not in  acute distress.    Appearance: She is well-developed.  HENT:     Head: Normocephalic and atraumatic.     Mouth/Throat:     Mouth: Mucous membranes are moist.     Pharynx: Oropharynx is clear.  Eyes:     Extraocular Movements: Extraocular movements intact.     Conjunctiva/sclera: Conjunctivae normal.     Pupils: Pupils are equal, round, and reactive to light.     Comments: Eyelids edges with mild edema and erythema, L>>R. Mild conjunctival injection.No drainage.  Cardiovascular:     Rate and Rhythm: Normal rate and regular rhythm.     Heart sounds: No murmur heard.    Comments: HR: 88/min. Pulmonary:     Effort: Pulmonary effort is normal. No respiratory distress.     Breath sounds: Normal breath sounds.  Abdominal:     Palpations: Abdomen is soft. There is no hepatomegaly or mass.     Tenderness: There is no abdominal tenderness.  Lymphadenopathy:     Cervical: No cervical adenopathy.  Skin:    General: Skin is warm.     Findings: No erythema or rash.  Neurological:     General: No focal deficit present.     Mental Status: She is alert and oriented to person, place, and time.     Cranial Nerves: No cranial nerve deficit.     Gait: Gait normal.  Psychiatric:     Comments: Well groomed, good eye contact.   ASSESSMENT AND PLAN:  [name] was seen today for facial swelling.  Diagnoses and all orders for this visit: Orders Placed This Encounter  Procedures   Flu Vaccine QUAD 17mo+IM (Fluarix, Fluzone & Alfiuria Quad PF)   Magnesium   Edema of eyelid, unspecified laterality We discussed possible etiologies. ? Angioedema vs allergic conjunctivitis. I do not think systemic steroid treatment is needed at this time. Topical ophthalmic on eye lids recommended, some side effects discussed. Hold on Allegra and start Zyrtec 10 mg bid x 10-14 days. Instructed about warning signs.  -     tobramycin-dexamethasone (TOBRADEX) ophthalmic ointment; Apply a small amount on eye lids 2  times daily for 7 days.  Hypokalemia Chronic. Did not tolerate spironolactone, possible side effects/allergic reaction. KLOR was increased from 20 meq qd to bid. Lab in 2-3 weeks.  Hypertension, essential, benign BP adequately controlled. Stop Spironolactone due to question of allergic reaction and increase Amlodipine from 5 mg to 10 mg daily. Continue low salt diet and monitor BP at home.  Need for influenza vaccination -     Flu Vaccine QUAD 3mo+IM (Fluarix, Fluzone & Alfiuria Quad PF)  Return if  symptoms worsen or fail to improve, for Lab in 2-3 weeks..   Wanda Knight G. [place], MD  [facility]. [place] office.

## 2021-08-03 ENCOUNTER — Other Ambulatory Visit: Payer: 59

## 2021-08-06 ENCOUNTER — Other Ambulatory Visit: Payer: Self-pay

## 2021-08-06 ENCOUNTER — Other Ambulatory Visit (INDEPENDENT_AMBULATORY_CARE_PROVIDER_SITE_OTHER): Payer: 59

## 2021-08-06 DIAGNOSIS — E876 Hypokalemia: Secondary | ICD-10-CM

## 2021-08-06 LAB — POTASSIUM: Potassium: 3.5 mEq/L (ref 3.5–5.1)

## 2021-08-06 LAB — MAGNESIUM: Magnesium: 2 mg/dL (ref 1.5–2.5)

## 2021-08-11 ENCOUNTER — Other Ambulatory Visit: Payer: Self-pay | Admitting: Family Medicine

## 2021-08-15 ENCOUNTER — Ambulatory Visit (INDEPENDENT_AMBULATORY_CARE_PROVIDER_SITE_OTHER): Payer: 59 | Admitting: Internal Medicine

## 2021-08-15 ENCOUNTER — Other Ambulatory Visit: Payer: Self-pay

## 2021-08-15 ENCOUNTER — Encounter: Payer: Self-pay | Admitting: Internal Medicine

## 2021-08-15 VITALS — BP 126/78 | HR 87 | Ht 62.0 in | Wt 196.0 lb

## 2021-08-15 DIAGNOSIS — E042 Nontoxic multinodular goiter: Secondary | ICD-10-CM

## 2021-08-15 LAB — T4, FREE: Free T4: 0.95 ng/dL (ref 0.60–1.60)

## 2021-08-15 LAB — TSH: TSH: 1.35 u[IU]/mL (ref 0.35–5.50)

## 2021-08-15 NOTE — Progress Notes (Signed)
Name: Wanda Thornton  MRN/ DOB: [id number], 06-11-1961    Age/ Sex: 60 y.o., female     PCP: [place], Betty G, MD   Reason for Endocrinology Evaluation: MNG     Initial Endocrinology Clinic Visit: 08/12/2020    PATIENT IDENTIFIER: Wanda Thornton is a 60 y.o., female with a past medical history of HTN, OSA on CPAP and dyslipidemia . She has followed with [facility] since 08/12/2020 for consultative assistance with management of her MNG.   HISTORICAL SUMMARY:  Pt noted to have thyromegaly during an examination in 06/2019, which prompted a thyroid ultrasound demonstrating MNG.  She is S/P FNA of the 1.8 cm left inferior nodule with benign cytology on 09/01/2019   Denies radiation exposure      NO FH of thyroid disease  SUBJECTIVE:     Today (08/15/2021):  Ms. Wigal is here for a follow up on MNG  She has been noted with weight loss  Denies anxiety  Denies diarrhea  Denies hand tremors.    Denies local neck swelling, has dry mouth   HISTORY:  Past Medical History:  Past Medical History:  Diagnosis Date   Allergy    Arthritis    fingers   Asthma    Hypertension    Sleep apnea    cpap   Past Surgical History:  Past Surgical History:  Procedure Laterality Date   BREAST BIOPSY Right    PARTIAL HYSTERECTOMY  2005   TRIGGER FINGER RELEASE     wisdom teeth     Social History:  reports that she has never smoked. She has been exposed to tobacco smoke. She has never used smokeless tobacco. She reports that she does not drink alcohol and does not use drugs. Family History:  Family History  Problem Relation Age of Onset   Hyperlipidemia Mother    Hypertension Mother    Hypertension Father    Hyperlipidemia Father    Diabetes Sister    Cancer Sister        breast   Breast cancer Sister    Diabetes Sister    Cancer Sister        breast   Breast cancer Sister    Breast cancer Maternal Aunt    Colon cancer Neg Hx    Colon  polyps Neg Hx    Esophageal cancer Neg Hx    Rectal cancer Neg Hx    Stomach cancer Neg Hx      HOME MEDICATIONS: Allergies as of 08/15/2021       Reactions   Amoxicillin Hives   Bactrim [sulfamethoxazole-trimethoprim] Swelling   Anaphylaxis    Clindamycin/lincomycin Swelling   Anaphylaxis    Hydrocodone    Nausea vomiting   Keflex [cephalexin] Swelling   Anaphylaxis    Lisinopril    headache        Medication List        Accurate as of August 15, 2021  4:20 PM. If you have any questions, ask your nurse or doctor.          Advair Diskus 500-50 MCG/ACT Aepb Generic drug: fluticasone-salmeterol USE 1 INHALATION IN THE MORNING AND AT BEDTIME   albuterol 108 (90 Base) MCG/ACT inhaler Commonly known as: VENTOLIN HFA USE 2 INHALATIONS EVERY 4 TO 6 HOURS AS NEEDED FOR DYSPNEA AND WHEEZING   amLODipine 10 MG tablet Commonly known as: NORVASC Take 1 tablet (10 mg total) by mouth daily.   B-complex with vitamin C tablet  Take 1 tablet by mouth daily.   CALCIUM CITRATE PO Take 2 tablets by mouth 2 (two) times daily.   cholecalciferol 1000 units tablet Commonly known as: VITAMIN D Take 2,000 Units by mouth daily.   CRANBERRY PO Take 1 tablet by mouth daily.   diphenhydrAMINE 25 MG tablet Commonly known as: BENADRYL Take 1 tablet (25 mg total) by mouth every 6 (six) hours for 1 day.   fexofenadine 180 MG tablet Commonly known as: ALLEGRA Take 180 mg by mouth daily.   fluticasone 50 MCG/ACT nasal spray Commonly known as: FLONASE USE 2 SPRAYS IN EACH NOSTRIL DAILY AS NEEDED FOR ALLERGIES   montelukast 10 MG tablet Commonly known as: SINGULAIR TAKE 1 TABLET AT BEDTIME   potassium chloride SA 20 MEQ tablet Commonly known as: [place]lor-Con M20 Take 1 tablet (20 mEq total) by mouth 2 (two) times daily.          OBJECTIVE:   PHYSICAL EXAM: VS: BP 126/78 (BP Location: Left Arm, Patient Position: Sitting, Cuff Size: Small)   Pulse 87   Ht 5\' 2"  (1.575  m)   Wt 196 lb (88.9 kg)   SpO2 98%   BMI 35.85 kg/m    EXAM: General: Pt appears well and is in NAD  Neck: General: Supple without adenopathy. Thyroid: Right thyroid nodule appreciated.   Lungs: Clear with good BS bilat with no rales, rhonchi, or wheezes  Heart: Auscultation: RRR.  Abdomen: Normoactive bowel sounds, soft, nontender, without masses or organomegaly palpable  Extremities:  BL LE: No pretibial edema normal ROM and strength.  Mental Status: Judgment, insight: Intact Orientation: Oriented to time, place, and person Mood and affect: No depression, anxiety, or agitation     DATA REVIEWED:  Results for RONNI, OSTERBERG (MRN [id number]) as of 08/15/2021 16:19  Ref. Range 08/15/2021 08:19  TSH Latest Ref Range: 0.35 - 5.50 uIU/mL 1.35  T4,Free(Direct) Latest Ref Range: 0.60 - 1.60 ng/dL 0.95    FNA 09/01/2019 Clinical History: Nodule 2 Left Inferior 1.8 cm; other 2 dimensions: 1.3  x 0.8 cm, Solid / almost completely solid, Hypoechoic, ACR TI-RADS total  points: 4, Moderately suspicious nodule  Specimen Submitted:  A. THYROID, LT LOBE LLP, FINE NEEDLE ASPIRATION:    FINAL MICROSCOPIC DIAGNOSIS:  - Consistent with benign follicular nodule (Bethesda category II)     Thyroid Ultrasound 09/15/2020  Estimated total number of nodules >/= 1 cm: 1   Number of spongiform nodules >/=  2 cm not described below (TR1): 0   Number of mixed cystic and solid nodules >/= 1.5 cm not described below ([place]): 0   _________________________________________________________   Simple cyst in the right superior thyroid lobe measures up to 0.7 cm and previously measured 0.4 cm.   Again noted is a hypoechoic nodule along the right mid posterior thyroid lobe. This nodule measures 0.8 x 0.4 x 0.8 cm and previously measured 0.8 x 0.4 x 0.6 cm. Stable morphology of the nodule. This is a TR 4 nodule. Given size (<0.9 cm) and appearance, this nodule does NOT meet TI-RADS criteria for  biopsy or dedicated follow-up.   Again noted is a solid nodule in the left inferior thyroid lobe that measures 1.4 x 0.7 x 0.8 cm and previously measured 1.8 x 0.8 x 1.3 cm. This nodule is poorly defined. This represents the previously biopsied nodule.   No new suspicious thyroid nodules.   IMPRESSION: 1. Biopsied nodule in left inferior thyroid lobe has slightly decreased in size. 2. No  new suspicious thyroid nodules.  ASSESSMENT / PLAN / RECOMMENDATIONS:   MNG:  - No local neck symptoms - She is clinically and biochemically euthyroid  - S/P benign FNA of the left inferior 1.8 cm nodule  - Will repeat thyroid ultrasound this year   F/U in 1 yr    Signed electronically by: Mack Guise, MD  [facility]  [facility] [address] [place], [place] [address] Phone: [phone] FAX: [phone]      CC: [place], Betty G, [place] [address] Phone: [phone]  Fax: [phone]   Return to Endocrinology clinic as below: Future Appointments  Date Time Provider [place]  10/25/2021  9:30 AM Deneise Lever, MD [facility] None  07/19/2022  8:00 AM [place], Betty G, MD [facility]  08/15/2022  7:30 AM Rhian Funari, Melanie Crazier, MD [facility] None

## 2021-08-29 ENCOUNTER — Ambulatory Visit
Admission: RE | Admit: 2021-08-29 | Discharge: 2021-08-29 | Disposition: A | Discharge status: Home / Self Care | Payer: 59 | Source: Ambulatory Visit | Attending: Internal Medicine | Admitting: Internal Medicine

## 2021-08-29 DIAGNOSIS — E042 Nontoxic multinodular goiter: Secondary | ICD-10-CM

## 2021-10-25 ENCOUNTER — Ambulatory Visit: Payer: 59 | Admitting: Internal Medicine

## 2021-10-30 ENCOUNTER — Encounter: Payer: Self-pay | Admitting: Internal Medicine

## 2021-10-31 NOTE — Progress Notes (Signed)
HPI female never smoker followed for asthma, allergic rhinitis,  OSA Allergy Profile 02/13/17- total IgE 66, specific elevations for Timothy grass and Elm pollen. Eosinophils normal 0.3 K/UL [facility] [place]/22/15-[facility]I 11.8/hour, desaturation to 82%, body weight 167 pounds Lab 7/22- IgE 73, EOS 400 Hypersensitivity Pneumonia Panel 10/26/20-  Component Ref Range & Units 1 yr ago  A.Fumigatus #1 Abs Negative Negative   Micropolyspora faeni, IgG Negative Negative   Thermoactinomyces vulgaris, IgG Negative Negative   A. Pullulans Abs Negative Negative   Thermoact. Saccharii Negative Negative   Pigeon Serum Abs Negative Negative   Resulting Agency     ------------------------------------------------------------------------------   10/25/20- 61 year old female never smoker followed for Asthma, Allergic Rhinitis,  OSA, complicated by Obesity, HTN, Thyroid nodules,  -Allegra, Advair 500, Albuterol hfa, Flonase, Singulair, CPAP auto 5-15/ [employer] [facility]- compliance 100%, AHI 0.9/ hr Body weight today-206 lbs Covid vax- 3 Moderna Flu vax-had Seen by Volanda Napoleon, NP for asthma in July with IgE 73, EOS 400 ACT score- 21 -----Patient has been waking up in a coughing fit and getting mucus up about twice a week. Otherwise feeling good, Sleeping good, machine working good.  Yellow sputum, then clear. Says Advair 500 is much better than the 250. Rescue hfa 1x/ week. Denies nasal symptoms now. Cold air triggers wheeze.  11/01/21- 61 year old female never smoker followed for Asthma, Allergic Rhinitis,  OSA, complicated by Obesity, HTN, Thyroid nodules,  -Allegra, Advair 500, Albuterol hfa, Flonase, Singulair, CPAP auto 5-15/ [employer] Download- compliance 100%, AHI 0.6/ hr Body weight today-198 lbs Covid vax- 5 Moderna                         Flu vax-had -----Patient is doing good, no concerns She is comfortable with her CPAP.  Download reviewed.  Good compliance and control.  She sleeps better  with it. She has failed Advair 500 controlled her asthma.  I discussed her abnormal CT scan and possibility that we are seeing residual from sarcoid and/or atypical infection.  Need to watch for chronic aspiration but lung involvement is mostly upper lobe.  Sputum described as yellowish with no blood.  No fever.  More cough in wintertime. CXR 10/25/20- IMPRESSION: Nonspecific diffusely increased pulmonary interstitium, perhaps greater on the right. No pleural fluid to suggest interstitial edema. Viral/atypical respiratory infection or chronic interstitial lung disease cannot be excluded. HRCT 07/19/21-  IMPRESSION: 1. The appearance of the lungs is unusual, with upper lung predominant mild bronchiectasis and peribronchovascular micronodularity. Findings are not favored to represent interstitial lung disease, but may represent sequela of chronic indolent atypical infectious process. Alternatively, these findings could be seen in the setting of a systemic disease such as sarcoidosis. Notably, there is no mediastinal or hilar lymphadenopathy noted on today's examination. 2. Aortic atherosclerosis. Aortic Atherosclerosis (ICD10-I70.0).  ROS-see HPI    "+" = positive  Constitutional:    weight loss, night sweats, fevers, chills, fatigue, lassitude. HEENT:    headaches, difficulty swallowing, tooth/dental problems, sore throat,       + sneezing, +itching, ear ache,  +nasal congestion, post nasal drip, snoring CV:    chest pain, orthopnea, PND, swelling in lower extremities, anasarca,  dizziness, palpitations Resp:   + shortness of breath with exertion or at rest.                +productive cough,   non-productive cough, coughing up of blood.              change in color of mucus.  +wheezing.   Skin:    rash or lesions. GI:  No-   heartburn, indigestion, abdominal pain, nausea, vomiting, diarrhea,                 change in bowel habits, loss  of appetite GU: dysuria, change in color of urine, no urgency or frequency.   flank pain. MS:   joint pain, stiffness, decreased range of motion, back pain. Neuro-     nothing unusual Psych:  change in mood or affect.  depression or anxiety.   memory loss.  OBJ- Physical Exam   General- Alert, Oriented, Affect-appropriate, Distress- none acute, + overweight Skin- rash-none, lesions- none, excoriation- none Lymphadenopathy- none Head- atraumatic            Eyes- Gross vision intact, PERRLA, conjunctivae and secretions clear            Ears- Hearing, canals-normal            Nose- + turbinate edema, no-Septal dev, mucus, polyps, erosion, perforation             Throat- Mallampati III-IV , mucosa clear , drainage- none, tonsils- atrophic Neck- flexible , trachea midline, no stridor , thyroid nl, carotid no bruit Chest - symmetrical excursion , unlabored           Heart/CV- RRR , no murmur , no gallop  , no rub, nl s1 s2                           - JVD- none , edema- none, stasis changes- none, varices- none           Lung- clear to P&A, wheeze- none, cough- none , dullness-none, rub- none           Chest wall-  Abd-  Br/ Gen/ Rectal- Not done, not indicated Extrem- cyanosis- none, clubbing, none, atrophy- none, strength- nl Neuro- grossly intact to observation

## 2021-11-01 ENCOUNTER — Encounter: Payer: Self-pay | Admitting: Internal Medicine

## 2021-11-01 ENCOUNTER — Ambulatory Visit (INDEPENDENT_AMBULATORY_CARE_PROVIDER_SITE_OTHER): Payer: 59 | Admitting: Internal Medicine

## 2021-11-01 ENCOUNTER — Other Ambulatory Visit: Payer: Self-pay

## 2021-11-01 VITALS — BP 120/72 | HR 106 | Temp 99.3°F | Ht 62.0 in | Wt 198.2 lb

## 2021-11-01 DIAGNOSIS — J45909 Unspecified asthma, uncomplicated: Secondary | ICD-10-CM | POA: Diagnosis not present

## 2021-11-01 DIAGNOSIS — D869 Sarcoidosis, unspecified: Secondary | ICD-10-CM | POA: Diagnosis not present

## 2021-11-01 DIAGNOSIS — Z9989 Dependence on other enabling machines and devices: Secondary | ICD-10-CM

## 2021-11-01 DIAGNOSIS — J4540 Moderate persistent asthma, uncomplicated: Secondary | ICD-10-CM | POA: Diagnosis not present

## 2021-11-01 DIAGNOSIS — G4733 Obstructive sleep apnea (adult) (pediatric): Secondary | ICD-10-CM | POA: Diagnosis not present

## 2021-11-01 DIAGNOSIS — R918 Other nonspecific abnormal finding of lung field: Secondary | ICD-10-CM

## 2021-11-01 LAB — CBC WITH DIFFERENTIAL/PLATELET
Basophils Absolute: 0.1 10*3/uL (ref 0.0–0.1)
Basophils Relative: 0.7 % (ref 0.0–3.0)
Eosinophils Absolute: 0.2 10*3/uL (ref 0.0–0.7)
Eosinophils Relative: 2.4 % (ref 0.0–5.0)
HCT: 45.3 % (ref 36.0–46.0)
Hemoglobin: 15.8 g/dL — ABNORMAL HIGH (ref 12.0–15.0)
Lymphocytes Relative: 25.3 % (ref 12.0–46.0)
Lymphs Abs: 1.9 10*3/uL (ref 0.7–4.0)
MCHC: 34.8 g/dL (ref 30.0–36.0)
MCV: 91.1 fl (ref 78.0–100.0)
Monocytes Absolute: 0.6 10*3/uL (ref 0.1–1.0)
Monocytes Relative: 7.8 % (ref 3.0–12.0)
Neutro Abs: 4.7 10*3/uL (ref 1.4–7.7)
Neutrophils Relative %: 63.8 % (ref 43.0–77.0)
Platelets: 310 10*3/uL (ref 150.0–400.0)
RBC: 4.97 Mil/uL (ref 3.87–5.11)
RDW: 13.3 % (ref 11.5–15.5)
WBC: 7.4 10*3/uL (ref 4.0–10.5)

## 2021-11-01 NOTE — Assessment & Plan Note (Signed)
She benefits from CPAP and sleeps better.  Good compliance and control. Plan-continue auto 5-15

## 2021-11-01 NOTE — Assessment & Plan Note (Addendum)
Upper lung zone involvement with mild bronchiectasis and fibrotic changes with micronodules.  We will be watching for progression.  Questions raised of possible atypical infection, possible sarcoid residual. Plan-sputum cultures, ACE level.  PFT.  Watch need for flutter device

## 2021-11-01 NOTE — Patient Instructions (Addendum)
Order- lab  Angiotensin converting enzyme level, CBC w diff, IgE level     dx sarcoid             Sputum cultures- routine, fungal, AFB/immunofluorescence    dx Asthmatic bronchitis  Order- schedule PFT    dx asthmatic broncchitis  We can continue CPAP auto 5-15  Please call if we can help

## 2021-11-01 NOTE — Assessment & Plan Note (Signed)
There is a component of reversible airway obstruction but that does not explain the CT appearance. Plan-continue current meds.

## 2021-11-04 LAB — ANGIOTENSIN CONVERTING ENZYME: Angiotensin-Converting Enzyme: 25 U/L (ref 9–67)

## 2021-11-04 LAB — IGE: IgE (Immunoglobulin E), Serum: 34 kU/L (ref <=114)

## 2021-11-04 LAB — CBC WITH DIFFERENTIAL/PLATELET

## 2021-11-05 NOTE — Progress Notes (Signed)
Spoke with pt and notified of results per Dr. Young Pt verbalized understanding and denied any questions. 

## 2021-11-06 ENCOUNTER — Other Ambulatory Visit: Payer: 59

## 2021-11-06 DIAGNOSIS — J45909 Unspecified asthma, uncomplicated: Secondary | ICD-10-CM

## 2021-11-06 DIAGNOSIS — D869 Sarcoidosis, unspecified: Secondary | ICD-10-CM

## 2021-11-07 LAB — RESPIRATORY CULTURE OR RESPIRATORY AND SPUTUM CULTURE: MICRO NUMBER:: 12974629

## 2021-11-08 ENCOUNTER — Other Ambulatory Visit: Payer: 59

## 2021-11-08 DIAGNOSIS — J45909 Unspecified asthma, uncomplicated: Secondary | ICD-10-CM

## 2021-11-08 DIAGNOSIS — D869 Sarcoidosis, unspecified: Secondary | ICD-10-CM

## 2021-11-14 ENCOUNTER — Encounter: Payer: Self-pay | Admitting: Internal Medicine

## 2021-11-14 NOTE — Telephone Encounter (Signed)
Dr. Annamaria Boots, pt came back to give second sputum sample. However, only an AFB was ran. Per your last OV note you wanted bacterial and fungal too. Would you like the pt to come back to give a third sample for the other two labs? Thanks.

## 2021-11-16 NOTE — Telephone Encounter (Signed)
Will forward to Dr. Annamaria Boots as Wanda Thornton. Pt wants to wait to do other two sputum labs.

## 2021-12-10 NOTE — Telephone Encounter (Signed)
Dr. Annamaria Boots, please see patient comment and advise.  Thank you. ?

## 2021-12-10 NOTE — Telephone Encounter (Signed)
I think we can cancel the order for sputum culture, since it has been difficult. We will watch this issue. If the cough gets worse, please let us know.  ?

## 2021-12-23 LAB — AFB CULTURE WITH SMEAR (NOT AT ARMC)
Acid Fast Culture: NEGATIVE
Acid Fast Smear: NEGATIVE

## 2021-12-25 ENCOUNTER — Encounter: Payer: Self-pay | Admitting: *Deleted

## 2022-01-20 ENCOUNTER — Other Ambulatory Visit: Payer: Self-pay | Admitting: Family Medicine

## 2022-01-20 DIAGNOSIS — J4540 Moderate persistent asthma, uncomplicated: Secondary | ICD-10-CM

## 2022-02-27 NOTE — Progress Notes (Unsigned)
HPI female never smoker followed for asthma, allergic rhinitis,  OSA Allergy Profile 02/13/17- total IgE 66, specific elevations for Timothy grass and Elm pollen. Eosinophils normal 0.3 K/UL [facility], [place]/22/15-[facility]I 11.8/hour, desaturation to 82%, body weight 167 pounds Lab 7/22- IgE 73, EOS 400 Hypersensitivity Pneumonia Panel 10/26/20-  Component Ref Range & Units 1 yr ago  A.Fumigatus #1 Abs Negative Negative   Micropolyspora faeni, IgG Negative Negative   Thermoactinomyces vulgaris, IgG Negative Negative   A. Pullulans Abs Negative Negative   Thermoact. Saccharii Negative Negative   Pigeon Serum Abs Negative Negative   Resulting Agency     ------------------------------------------------------------------------------   11/01/21- 61 year old female never smoker followed for Asthma, Allergic Rhinitis,  OSA, complicated by Obesity, HTN, Thyroid nodules,  -Allegra, Advair 500, Albuterol hfa, Flonase, Singulair, CPAP auto 5-15/ [facility] Download- compliance 100%, AHI 0.6/ hr Body weight today-198 lbs Covid vax- 5 Moderna                         Flu vax-had -----Patient is doing good, no concerns She is comfortable with her CPAP.  Download reviewed.  Good compliance and control.  She sleeps better with it. She has felt Advair 500 controlled her asthma.  I discussed her abnormal CT scan and possibility that we are seeing residual from sarcoid and/or atypical infection.  Need to watch for chronic aspiration but lung involvement is mostly upper lobe.  Sputum described as yellowish with no blood.  No fever.  More cough in wintertime. CXR 10/25/20- IMPRESSION: Nonspecific diffusely increased pulmonary interstitium, perhaps greater on the right. No pleural fluid to suggest interstitial edema. Viral/atypical respiratory infection or chronic interstitial lung disease cannot be excluded. HRCT 11/19/20-  IMPRESSION: 1. The appearance of the lungs is unusual, with upper  lung predominant mild bronchiectasis and peribronchovascular micronodularity. Findings are not favored to represent interstitial lung disease, but may represent sequela of chronic indolent atypical infectious process. Alternatively, these findings could be seen in the setting of a systemic disease such as sarcoidosis. Notably, there is no mediastinal or hilar lymphadenopathy noted on today's examination. 2. Aortic atherosclerosis. Aortic Atherosclerosis (ICD10-I70.0).  03/01/22- 61 year old female never smoker followed for Asthma, Allergic Rhinitis,  OSA, Pulmonary Fibrosis/ atypical, complicated by Obesity, HTN, Thyroid nodules,  -Allegra, Advair 500, Albuterol hfa, Flonase, Singulair, CPAP auto 5-15/ [employer] Download- compliance 100%, AHI 0.8/ hr Body weight today- Covid vax- 5 Moderna                         Flu vax-had LAB- ACE level 11/01/21- 25 wnl, IgE 34, AFB sputum NEG PFT- 03/01/22-results look normal pending formal review She feels well and denies breathing problems currently.  PFT was checked because imaging at last visit had suggested possible atypical infection.  She reports walking 10,000 steps 5 days/week.  Has not had a cold or flu in years but does complain of some perennial allergic stuffiness.  She avoids exposure to grass pollen, perfumes, smoke and irritants.  Continues Advair and occasional rescue inhaler.  ROS-see HPI    "+" = positive  Constitutional:    weight loss, night sweats, fevers, chills, fatigue, lassitude. HEENT:    headaches, difficulty swallowing, tooth/dental problems, sore throat,       + sneezing, +itching, ear ache,  +nasal congestion, post nasal drip, snoring CV:    chest pain, orthopnea, PND, swelling in lower extremities, anasarca,  dizziness, palpitations Resp:   + shortness of breath with exertion or at rest.                +productive cough,   non-productive cough, coughing up of blood.               change in color of mucus.  +wheezing.   Skin:    rash or lesions. GI:  No-   heartburn, indigestion, abdominal pain, nausea, vomiting, diarrhea,                 change in bowel habits, loss of appetite GU: dysuria, change in color of urine, no urgency or frequency.   flank pain. MS:   joint pain, stiffness, decreased range of motion, back pain. Neuro-     nothing unusual Psych:  change in mood or affect.  depression or anxiety.   memory loss.  OBJ- Physical Exam   General- Alert, Oriented, Affect-appropriate, Distress- none acute, + overweight Skin- rash-none, lesions- none, excoriation- none Lymphadenopathy- none Head- atraumatic            Eyes- Gross vision intact, PERRLA, conjunctivae and secretions clear            Ears- Hearing, canals-normal            Nose- + turbinate edema, no-Septal dev, mucus, polyps, erosion, perforation             Throat- Mallampati III-IV , mucosa clear , drainage- none, tonsils- atrophic Neck- flexible , trachea midline, no stridor , thyroid nl, carotid no bruit Chest - symmetrical excursion , unlabored           Heart/CV- RRR , no murmur , no gallop  , no rub, nl s1 s2                           - JVD- none , edema- none, stasis changes- none, varices- none           Lung- clear to P&A, wheeze- none, cough- none , dullness-none, rub- none           Chest wall-  Abd-  Br/ Gen/ Rectal- Not done, not indicated Extrem- cyanosis- none, clubbing, none, atrophy- none, strength- nl Neuro- grossly intact to observation

## 2022-03-01 ENCOUNTER — Encounter: Payer: Self-pay | Admitting: Internal Medicine

## 2022-03-01 ENCOUNTER — Ambulatory Visit (INDEPENDENT_AMBULATORY_CARE_PROVIDER_SITE_OTHER): Payer: 59 | Admitting: Internal Medicine

## 2022-03-01 DIAGNOSIS — J4540 Moderate persistent asthma, uncomplicated: Secondary | ICD-10-CM | POA: Diagnosis not present

## 2022-03-01 DIAGNOSIS — R918 Other nonspecific abnormal finding of lung field: Secondary | ICD-10-CM | POA: Diagnosis not present

## 2022-03-01 DIAGNOSIS — Z9989 Dependence on other enabling machines and devices: Secondary | ICD-10-CM

## 2022-03-01 DIAGNOSIS — J45909 Unspecified asthma, uncomplicated: Secondary | ICD-10-CM

## 2022-03-01 DIAGNOSIS — D869 Sarcoidosis, unspecified: Secondary | ICD-10-CM | POA: Diagnosis not present

## 2022-03-01 DIAGNOSIS — G4733 Obstructive sleep apnea (adult) (pediatric): Secondary | ICD-10-CM | POA: Diagnosis not present

## 2022-03-01 LAB — PULMONARY FUNCTION TEST
DL/VA % pred: 113 %
DL/VA: 4.92 ml/min/mmHg/L
DLCO cor % pred: 117 %
DLCO cor: 21.6 ml/min/mmHg
DLCO unc % pred: 117 %
DLCO unc: 21.6 ml/min/mmHg
FEF 25-75 Post: 4.25 L/sec
FEF 25-75 Pre: 3.89 L/sec
FEF2575-%Change-Post: 9 %
FEF2575-%Pred-Post: 219 %
FEF2575-%Pred-Pre: 200 %
FEV1-%Change-Post: 1 %
FEV1-%Pred-Post: 138 %
FEV1-%Pred-Pre: 136 %
FEV1-Post: 2.61 L
FEV1-Pre: 2.56 L
FEV1FVC-%Change-Post: 2 %
FEV1FVC-%Pred-Pre: 110 %
FEV6-%Change-Post: 0 %
FEV6-%Pred-Post: 125 %
FEV6-%Pred-Pre: 126 %
FEV6-Post: 2.88 L
FEV6-Pre: 2.9 L
FEV6FVC-%Pred-Post: 103 %
FEV6FVC-%Pred-Pre: 103 %
FVC-%Change-Post: 0 %
FVC-%Pred-Post: 121 %
FVC-%Pred-Pre: 121 %
FVC-Post: 2.88 L
FVC-Pre: 2.9 L
Post FEV1/FVC ratio: 90 %
Post FEV6/FVC ratio: 100 %
Pre FEV1/FVC ratio: 88 %
Pre FEV6/FVC Ratio: 100 %
RV % pred: 82 %
RV: 1.49 L
TLC % pred: 99 %
TLC: 4.6 L

## 2022-03-01 MED ORDER — FLUTICASONE-SALMETEROL 500-50 MCG/ACT IN AEPB: INHALATION_SPRAY | RESPIRATORY_TRACT | 1 refills | Status: DC

## 2022-03-01 NOTE — Progress Notes (Signed)
PFT done today. 

## 2022-03-01 NOTE — Patient Instructions (Signed)
Your test results look very good  Ok to continue current meds. Advair refill sent  Please call if we can help

## 2022-03-01 NOTE — Assessment & Plan Note (Signed)
We will watch this for possible progression.  She is asymptomatic, AFB sputum culture was negative, ACE level for possible sarcoid was normal and PFT appears normal.

## 2022-03-01 NOTE — Assessment & Plan Note (Signed)
Benefits from CPAP with good compliance and control Plan- continue auto 5-15 

## 2022-03-01 NOTE — Assessment & Plan Note (Signed)
Very well controlled currently.  PFT looks normal. Plan-continue current meds.  Refill sent for Advair

## 2022-03-05 NOTE — Progress Notes (Signed)
Spoke with pt and notified of results per Dr. Young Pt verbalized understanding and denied any questions. 

## 2022-04-30 ENCOUNTER — Other Ambulatory Visit: Payer: Self-pay | Admitting: Family Medicine

## 2022-04-30 DIAGNOSIS — I10 Essential (primary) hypertension: Secondary | ICD-10-CM

## 2022-04-30 DIAGNOSIS — E876 Hypokalemia: Secondary | ICD-10-CM

## 2022-05-10 ENCOUNTER — Other Ambulatory Visit: Payer: Self-pay | Admitting: Family Medicine

## 2022-05-10 DIAGNOSIS — Z1231 Encounter for screening mammogram for malignant neoplasm of breast: Secondary | ICD-10-CM

## 2022-06-23 ENCOUNTER — Other Ambulatory Visit: Payer: Self-pay | Admitting: Family Medicine

## 2022-06-23 DIAGNOSIS — J4540 Moderate persistent asthma, uncomplicated: Secondary | ICD-10-CM

## 2022-07-17 NOTE — Progress Notes (Unsigned)
HPI: Wanda Thornton is a 61 y.o. female with medical history significant for hypertension, OSA on CPAP, seasonal allergies, vitamin D deficiency, hyperlipidemia, and sarcoidosis here today for her routine physical. Last CPE: 07/17/21 She states that she has been exercising regularly, walking four times per week and achieving eight to nine thousand steps per session. However, she mentions experiencing foot pain, which has led to a reduction in her walking routine. She reports sleeping an average of eight hours per night and consistently wearing her CPAP device.  She is following a healthful diet, particularly since getting teeth braces, which has limited her food choices and slowed her eating pace. She is cooking at home and consuming daily vegetables.  Immunization History  Administered Date(s) Administered   Influenza,inj,Quad PF,6+ Mos 10/16/1998, 06/17/2017, 06/17/2018, 06/29/2019, 07/10/2020, 07/23/2021, 07/19/2022   Moderna Sars-Covid-2 Vaccination 12/16/2019, 01/18/2020, 08/05/2020, 02/22/2021, 07/08/2021   Pneumococcal Polysaccharide-23 11/07/2015   Tdap 09/15/2009, 10/08/2019   Zoster Recombinat (Shingrix) 07/19/2022   Health Maintenance  Topic Date Due   COVID-19 Vaccine (6 - Moderna series) 09/02/2021   Zoster Vaccines- Shingrix (2 of 2) 09/13/2022   MAMMOGRAM  07/20/2023   COLONOSCOPY (Pts 45-61yr Insurance coverage will need to be confirmed)  08/08/2026   TETANUS/TDAP  10/07/2029   INFLUENZA VACCINE  Completed   Hepatitis C Screening  Completed   HPV VACCINES  Aged Out   PAP SMEAR-Modifier  Discontinued   HIV Screening  Discontinued   Regarding her medications, She is taking amlodipine '10mg'$  daily for HTN management and KLOR 20 meq bid for hypokalemia. She checks her BP's  regularly, with readings typically around 120/70-75 mmHg. She has tolerated medication well. Lab Results  Component Value Date   CREATININE 0.69 07/17/2021   BUN 12 07/17/2021   NA 143  07/17/2021   K 3.5 08/06/2021   CL 106 07/17/2021   CO2 28 07/17/2021   For her asthma, sarcoidosis,and OSA she follows annually with pulmonologist, Dr YAnnamaria Boots She is also using Advair 500-50 mcg twice daily and albuterol inhaler. She does not need albuterol inhaler very often.  Today she is complaining of having a "knot and "on her left foot that does not cause pain.  It seems to be stable in regards to size.  She has not noted local edema or erythema.  No history of trauma.  Hyperlipidemia: Currently she is on nonpharmacologic treatment. Lab Results  Component Value Date   CHOL 165 07/17/2021   HDL 40.90 07/17/2021   LDLCALC 106 (H) 07/17/2021   TRIG 93.0 07/17/2021   CHOLHDL 4 07/17/2021   Vitamin D deficiency:She is taking calcium with vitamin D supplements.  Last 25 OH vitamin D was 41 in 06/2021.  Multinodular goiter, follows with endocrinologist annually, next appt 09/2022.  Review of Systems  Constitutional:  Negative for activity change, appetite change and fever.  HENT:  Negative for hearing loss, mouth sores, sore throat and trouble swallowing.   Eyes:  Negative for redness and visual disturbance.  Respiratory:  Negative for cough, shortness of breath and wheezing.   Cardiovascular:  Negative for chest pain and leg swelling.  Gastrointestinal:  Negative for abdominal pain, nausea and vomiting.       No changes in bowel habits.  Endocrine: Negative for cold intolerance, heat intolerance, polydipsia, polyphagia and polyuria.  Genitourinary:  Negative for decreased urine volume, dysuria, hematuria, vaginal bleeding and vaginal discharge.  Musculoskeletal:  Negative for gait problem and myalgias.  Skin:  Negative for color change and rash.  Allergic/Immunologic: Positive for environmental allergies.  Neurological:  Negative for seizures, syncope, weakness and headaches.  Hematological:  Negative for adenopathy. Does not bruise/bleed easily.  Psychiatric/Behavioral:   Negative for confusion. The patient is not nervous/anxious.   All other systems reviewed and are negative.  Current Outpatient Medications on File Prior to Visit  Medication Sig Dispense Refill   albuterol (VENTOLIN HFA) 108 (90 Base) MCG/ACT inhaler USE 2 INHALATIONS EVERY 4 TO 6 HOURS AS NEEDED FOR DYSPNEA AND WHEEZING 25.5 g 3   amLODipine (NORVASC) 10 MG tablet TAKE 1 TABLET DAILY 90 tablet 3   B Complex-C (B-COMPLEX WITH VITAMIN C) tablet Take 1 tablet by mouth daily.     CALCIUM CITRATE PO Take 2 tablets by mouth 2 (two) times daily.     cholecalciferol (VITAMIN D) 1000 units tablet Take 2,000 Units by mouth daily.      CRANBERRY PO Take 1 tablet by mouth daily.     fexofenadine (ALLEGRA) 180 MG tablet Take 180 mg by mouth daily.     fluticasone (FLONASE) 50 MCG/ACT nasal spray USE 2 SPRAYS IN EACH NOSTRIL DAILY AS NEEDED FOR ALLERGIES 48 g 3   fluticasone-salmeterol (ADVAIR DISKUS) 500-50 MCG/ACT AEPB USE 1 INHALATION IN THE MORNING AND AT BEDTIME Rinse mouth 180 each 1   KLOR-CON M20 20 MEQ tablet TAKE 1 TABLET TWICE A DAY 180 tablet 3   montelukast (SINGULAIR) 10 MG tablet TAKE 1 TABLET AT BEDTIME 90 tablet 3   diphenhydrAMINE (BENADRYL) 25 MG tablet Take 1 tablet (25 mg total) by mouth every 6 (six) hours for 1 day. 4 tablet 0   No current facility-administered medications on file prior to visit.   Past Medical History:  Diagnosis Date   Allergy    Arthritis    fingers   Asthma    Hypertension    Sleep apnea    cpap   Past Surgical History:  Procedure Laterality Date   BREAST BIOPSY Right    PARTIAL HYSTERECTOMY  2005   TRIGGER FINGER RELEASE     wisdom teeth     Allergies  Allergen Reactions   Amoxicillin Hives   Bactrim [Sulfamethoxazole-Trimethoprim] Swelling    Anaphylaxis    Clindamycin/Lincomycin Swelling    Anaphylaxis    Hydrocodone     Nausea vomiting   Keflex [Cephalexin] Swelling    Anaphylaxis    Lisinopril     headache    Family History   Problem Relation Age of Onset   Hyperlipidemia Mother    Hypertension Mother    Hypertension Father    Hyperlipidemia Father    Diabetes Sister    Cancer Sister        breast   Breast cancer Sister    Diabetes Sister    Cancer Sister        breast   Breast cancer Sister    Breast cancer Maternal Aunt    Colon cancer Neg Hx    Colon polyps Neg Hx    Esophageal cancer Neg Hx    Rectal cancer Neg Hx    Stomach cancer Neg Hx    Social History   Socioeconomic History   Marital status: Married    Spouse name: Not on file   Number of children: Not on file   Years of education: Not on file   Highest education level: Not on file  Occupational History   Not on file  Tobacco Use   Smoking status: Never    Passive exposure:  Yes   Smokeless tobacco: Never  Vaping Use   Vaping Use: Never used  Substance and Sexual Activity   Alcohol use: No   Drug use: No   Sexual activity: Yes  Other Topics Concern   Not on file  Social History Narrative   Not on file   Social Determinants of Health   Financial Resource Strain: Not on file  Food Insecurity: Not on file  Transportation Needs: Not on file  Physical Activity: Not on file  Stress: Not on file  Social Connections: Not on file   Vitals:   07/19/22 0756  BP: 128/80  Pulse: 100  Resp: 12  Temp: 98.8 F (37.1 C)  SpO2: 98%  Body mass index is 35.55 kg/m. Wt Readings from Last 3 Encounters:  07/19/22 188 lb 2 oz (85.3 kg)  03/01/22 195 lb 12.8 oz (88.8 kg)  11/01/21 198 lb 3.2 oz (89.9 kg)  Physical Exam Vitals and nursing note reviewed.  Constitutional:      General: She is not in acute distress.    Appearance: She is well-developed.  HENT:     Head: Normocephalic and atraumatic.     Right Ear: Hearing, tympanic membrane, ear canal and external ear normal.     Left Ear: Hearing, tympanic membrane, ear canal and external ear normal.     Mouth/Throat:     Mouth: Mucous membranes are moist.     Pharynx:  Oropharynx is clear. Uvula midline.  Eyes:     Extraocular Movements: Extraocular movements intact.     Conjunctiva/sclera: Conjunctivae normal.     Pupils: Pupils are equal, round, and reactive to light.  Neck:     Thyroid: Thyromegaly present.  Cardiovascular:     Rate and Rhythm: Normal rate and regular rhythm.     Pulses:          Posterior tibial pulses are 2+ on the right side and 2+ on the left side.     Heart sounds: No murmur heard. Pulmonary:     Effort: Pulmonary effort is normal. No respiratory distress.     Breath sounds: Normal breath sounds.  Abdominal:     Palpations: Abdomen is soft. There is no hepatomegaly or mass.     Tenderness: There is no abdominal tenderness.  Genitourinary:    Comments: No concerns. Musculoskeletal:       Feet:     Comments: No major deformity or signs of synovitis appreciated.  Lymphadenopathy:     Cervical: No cervical adenopathy.     Upper Body:     Right upper body: No supraclavicular adenopathy.     Left upper body: No supraclavicular adenopathy.  Skin:    General: Skin is warm.     Findings: No erythema or rash.  Neurological:     General: No focal deficit present.     Mental Status: She is alert and oriented to person, place, and time.     Cranial Nerves: No cranial nerve deficit.     Coordination: Coordination normal.     Gait: Gait normal.     Deep Tendon Reflexes:     Reflex Scores:      Bicep reflexes are 2+ on the right side and 2+ on the left side.      Patellar reflexes are 2+ on the right side and 2+ on the left side. Psychiatric:        Mood and Affect: Mood and affect normal.   ASSESSMENT AND PLAN:  Ms. Emmah  Miray Mancino was here today annual physical examination.  Orders Placed This Encounter  Procedures   DG Bone Density   Flu Vaccine QUAD 11moIM (Fluarix, Fluzone & Alfiuria Quad PF)   Varicella-zoster vaccine IM   Comprehensive metabolic panel   Lipid panel   Hemoglobin A1c   TSH   VITAMIN D 25  Hydroxy (Vit-D Deficiency, Fractures)   Lab Results  Component Value Date   TSH 1.61 07/19/2022   Lab Results  Component Value Date   CREATININE 0.72 07/19/2022   BUN 12 07/19/2022   NA 142 07/19/2022   K 3.2 (L) 07/19/2022   CL 105 07/19/2022   CO2 27 07/19/2022   Lab Results  Component Value Date   ALT 18 07/19/2022   AST 19 07/19/2022   ALKPHOS 89 07/19/2022   BILITOT 0.9 07/19/2022   Lab Results  Component Value Date   HGBA1C 4.8 07/19/2022   Lab Results  Component Value Date   CHOL 168 07/19/2022   HDL 43.80 07/19/2022   LDLCALC 110 (H) 07/19/2022   TRIG 73.0 07/19/2022   CHOLHDL 4 07/19/2022   Routine general medical examination at a health care facility We discussed the importance of regular physical activity and healthy diet for prevention of chronic illness and/or complications. Preventive guidelines reviewed. S/P hysterectomy. Vaccination updated. Ca++ and vit D supplementation to continue. Next CPE in a year. The 10-year ASCVD risk score (Arnett DK, et al., 2019) is: 6%   Values used to calculate the score:     Age: [age]years     Sex: Female     Is Non-Hispanic African American: Yes     Diabetic: No     Tobacco smoker: No     Systolic Blood Pressure: 1765mmHg     Is BP treated: Yes     HDL Cholesterol: 43.8 mg/dL     Total Cholesterol: 168 mg/dL  Screening for endocrine, metabolic and immunity disorder -     Hemoglobin A1c  Asymptomatic postmenopausal estrogen deficiency -     DG Bone Density; Future  Need for influenza vaccination -     Flu Vaccine QUAD 680moM (Fluarix, Fluzone & Alfiuria Quad PF)  Need for shingles vaccine -     Varicella-zoster vaccine IM  Plantar fascial fibromatosis of left foot We discussed differential diagnosis. Currently she is asymptomatic, so recommend continue monitoring for new symptoms.  If it starts growing or causing pain, she was instructed to arrange appointment with podiatrist.  Vitamin D deficiency,  unspecified Continue same dose of vitamin D supplementation. Further recommendation will be given according to 25 OH vitamin D result.  Hypokalemia Last potassium 3.5 in 07/2021. Continue K-Lor 20 mK twice daily.  Hypertension, essential, benign BP adequately controlled. Continue current management: Amlodipine 10 mg daily. DASH/low salt diet to continue. Continue monitoring BP at home. Eye exam is current.  Dyslipidemia (high LDL; low HDL) Non pharmacologic treatment recommended for now. Further recommendations will be given according to 10 years CVD risk score and lipid panel numbers.  Return in 1 year (on 07/20/2023).  Shian Goodnow G. JoMartiniqueMD  [facility].

## 2022-07-19 ENCOUNTER — Ambulatory Visit (INDEPENDENT_AMBULATORY_CARE_PROVIDER_SITE_OTHER): Payer: 59 | Admitting: Family Medicine

## 2022-07-19 ENCOUNTER — Encounter: Payer: Self-pay | Admitting: Family Medicine

## 2022-07-19 VITALS — BP 128/80 | HR 100 | Temp 98.8°F | Resp 12 | Ht 61.0 in | Wt 188.1 lb

## 2022-07-19 DIAGNOSIS — Z0000 Encounter for general adult medical examination without abnormal findings: Secondary | ICD-10-CM | POA: Diagnosis not present

## 2022-07-19 DIAGNOSIS — E876 Hypokalemia: Secondary | ICD-10-CM

## 2022-07-19 DIAGNOSIS — Z23 Encounter for immunization: Secondary | ICD-10-CM | POA: Diagnosis not present

## 2022-07-19 DIAGNOSIS — Z1329 Encounter for screening for other suspected endocrine disorder: Secondary | ICD-10-CM | POA: Diagnosis not present

## 2022-07-19 DIAGNOSIS — I10 Essential (primary) hypertension: Secondary | ICD-10-CM

## 2022-07-19 DIAGNOSIS — M722 Plantar fascial fibromatosis: Secondary | ICD-10-CM

## 2022-07-19 DIAGNOSIS — Z13228 Encounter for screening for other metabolic disorders: Secondary | ICD-10-CM

## 2022-07-19 DIAGNOSIS — E559 Vitamin D deficiency, unspecified: Secondary | ICD-10-CM | POA: Diagnosis not present

## 2022-07-19 DIAGNOSIS — Z130 Encounter for screening for diseases of the blood and blood-forming organs and certain disorders involving the immune mechanism: Secondary | ICD-10-CM

## 2022-07-19 DIAGNOSIS — E785 Hyperlipidemia, unspecified: Secondary | ICD-10-CM

## 2022-07-19 DIAGNOSIS — Z780 Asymptomatic menopausal state: Secondary | ICD-10-CM

## 2022-07-19 LAB — COMPREHENSIVE METABOLIC PANEL
ALT: 18 U/L (ref 0–35)
AST: 19 U/L (ref 0–37)
Albumin: 4.4 g/dL (ref 3.5–5.2)
Alkaline Phosphatase: 89 U/L (ref 39–117)
BUN: 12 mg/dL (ref 6–23)
CO2: 27 mEq/L (ref 19–32)
Calcium: 9.4 mg/dL (ref 8.4–10.5)
Chloride: 105 mEq/L (ref 96–112)
Creatinine, Ser: 0.72 mg/dL (ref 0.40–1.20)
GFR: 91.87 mL/min (ref >60.00)
Glucose, Bld: 89 mg/dL (ref 70–99)
Potassium: 3.2 mEq/L — ABNORMAL LOW (ref 3.5–5.1)
Sodium: 142 mEq/L (ref 135–145)
Total Bilirubin: 0.9 mg/dL (ref 0.2–1.2)
Total Protein: 7.6 g/dL (ref 6.0–8.3)

## 2022-07-19 LAB — VITAMIN D 25 HYDROXY (VIT D DEFICIENCY, FRACTURES): VITD: 40.3 ng/mL (ref 30.00–100.00)

## 2022-07-19 LAB — LIPID PANEL
Cholesterol: 168 mg/dL (ref 0–200)
HDL: 43.8 mg/dL (ref >39.00)
LDL Cholesterol: 110 mg/dL — ABNORMAL HIGH (ref 0–99)
NonHDL: 124.63
Total CHOL/HDL Ratio: 4
Triglycerides: 73 mg/dL (ref 0.0–149.0)
VLDL: 14.6 mg/dL (ref 0.0–40.0)

## 2022-07-19 LAB — TSH: TSH: 1.61 u[IU]/mL (ref 0.35–5.50)

## 2022-07-19 LAB — HEMOGLOBIN A1C: Hgb A1c MFr Bld: 4.8 % (ref 4.6–6.5)

## 2022-07-19 NOTE — Assessment & Plan Note (Signed)
BP adequately controlled. Continue current management: Amlodipine 10 mg daily. DASH/low salt diet to continue. Continue monitoring BP at home. Eye exam is current.

## 2022-07-19 NOTE — Patient Instructions (Addendum)
A few things to remember from today's visit:  Routine general medical examination at a health care facility  Screening for endocrine, metabolic and immunity disorder - Plan: Hemoglobin A1c  Hypertension, essential, benign - Plan: Comprehensive metabolic panel, TSH  Vitamin D deficiency, unspecified  Dyslipidemia (high LDL; low HDL) - Plan: Lipid panel  If you need refills for medications you take chronically, please call your pharmacy. Do not use My Chart to request refills or for acute issues that need immediate attention. If you send a my chart message, it may take a few days to be addressed, specially if I am not in the office.  Please be sure medication list is accurate. If a new problem present, please set up appointment sooner than planned today.  Continue same meds. Monitor blood pressure every 1-2 weeks.  Health Maintenance, Female Adopting a healthy lifestyle and getting preventive care are important in promoting health and wellness. Ask your health care provider about: The right schedule for you to have regular tests and exams. Things you can do on your own to prevent diseases and keep yourself healthy. What should I know about diet, weight, and exercise? Eat a healthy diet  Eat a diet that includes plenty of vegetables, fruits, low-fat dairy products, and lean protein. Do not eat a lot of foods that are high in solid fats, added sugars, or sodium. Maintain a healthy weight Body mass index (BMI) is used to identify weight problems. It estimates body fat based on height and weight. Your health care provider can help determine your BMI and help you achieve or maintain a healthy weight. Get regular exercise Get regular exercise. This is one of the most important things you can do for your health. Most adults should: Exercise for at least 150 minutes each week. The exercise should increase your heart rate and make you sweat (moderate-intensity exercise). Do strengthening  exercises at least twice a week. This is in addition to the moderate-intensity exercise. Spend less time sitting. Even light physical activity can be beneficial. Watch cholesterol and blood lipids Have your blood tested for lipids and cholesterol at 59 years of age, then have this test every 5 years. Have your cholesterol levels checked more often if: Your lipid or cholesterol levels are high. You are older than 59 years of age. You are at high risk for heart disease. What should I know about cancer screening? Depending on your health history and family history, you may need to have cancer screening at various ages. This may include screening for: Breast cancer. Cervical cancer. Colorectal cancer. Skin cancer. Lung cancer. What should I know about heart disease, diabetes, and high blood pressure? Blood pressure and heart disease High blood pressure causes heart disease and increases the risk of stroke. This is more likely to develop in people who have high blood pressure readings or are overweight. Have your blood pressure checked: Every 3-5 years if you are 63-61 years of age. Every year if you are 5 years old or older. Diabetes Have regular diabetes screenings. This checks your fasting blood sugar level. Have the screening done: Once every three years after age 25 if you are at a normal weight and have a low risk for diabetes. More often and at a younger age if you are overweight or have a high risk for diabetes. What should I know about preventing infection? Hepatitis B If you have a higher risk for hepatitis B, you should be screened for this virus. Talk with your health  care provider to find out if you are at risk for hepatitis B infection. Hepatitis C Testing is recommended for: Everyone born from [birth year] through 1963. Anyone with known risk factors for hepatitis C. Sexually transmitted infections (STIs) Get screened for STIs, including gonorrhea and chlamydia, if: You are  sexually active and are younger than 59 years of age. You are older than 59 years of age and your health care provider tells you that you are at risk for this type of infection. Your sexual activity has changed since you were last screened, and you are at increased risk for chlamydia or gonorrhea. Ask your health care provider if you are at risk. Ask your health care provider about whether you are at high risk for HIV. Your health care provider may recommend a prescription medicine to help prevent HIV infection. If you choose to take medicine to prevent HIV, you should first get tested for HIV. You should then be tested every 3 months for as long as you are taking the medicine. Pregnancy If you are about to stop having your period (premenopausal) and you may become pregnant, seek counseling before you get pregnant. Take 400 to 800 micrograms (mcg) of folic acid every day if you become pregnant. Ask for birth control (contraception) if you want to prevent pregnancy. Osteoporosis and menopause Osteoporosis is a disease in which the bones lose minerals and strength with aging. This can result in bone fractures. If you are 31 years old or older, or if you are at risk for osteoporosis and fractures, ask your health care provider if you should: Be screened for bone loss. Take a calcium or vitamin D supplement to lower your risk of fractures. Be given hormone replacement therapy (HRT) to treat symptoms of menopause. Follow these instructions at home: Alcohol use Do not drink alcohol if: Your health care provider tells you not to drink. You are pregnant, may be pregnant, or are planning to become pregnant. If you drink alcohol: Limit how much you have to: 0-1 drink a day. Know how much alcohol is in your drink. In the [place] one drink equals one 12 oz bottle of beer (355 mL), one 5 oz glass of wine (148 mL), or one 1 oz glass of hard liquor (44 mL). Lifestyle Do not use any products that contain  nicotine or tobacco. These products include cigarettes, chewing tobacco, and vaping devices, such as e-cigarettes. If you need help quitting, ask your health care provider. Do not use street drugs. Do not share needles. Ask your health care provider for help if you need support or information about quitting drugs. General instructions Schedule regular health, dental, and eye exams. Stay current with your vaccines. Tell your health care provider if: You often feel depressed. You have ever been abused or do not feel safe at home. Summary Adopting a healthy lifestyle and getting preventive care are important in promoting health and wellness. Follow your health care provider's instructions about healthy diet, exercising, and getting tested or screened for diseases. Follow your health care provider's instructions on monitoring your cholesterol and blood pressure. This information is not intended to replace advice given to you by your health care provider. Make sure you discuss any questions you have with your health care provider. Document Revised: 02/05/2021 Document Reviewed: 02/05/2021 Elsevier Patient Education  [place].

## 2022-07-19 NOTE — Assessment & Plan Note (Signed)
Non pharmacologic treatment recommended for now. Further recommendations will be given according to 10 years CVD risk score and lipid panel numbers. 

## 2022-07-19 NOTE — Assessment & Plan Note (Signed)
Last potassium 3.5 in 07/2021. Continue K-Lor 20 mK twice daily.

## 2022-07-19 NOTE — Assessment & Plan Note (Signed)
Continue same dose of vitamin D supplementation. Further recommendation will be given according to 25 OH vitamin D result.

## 2022-07-22 ENCOUNTER — Ambulatory Visit
Admission: RE | Admit: 2022-07-22 | Discharge: 2022-07-22 | Disposition: A | Discharge status: Home / Self Care | Payer: 59 | Source: Ambulatory Visit | Attending: Family Medicine | Admitting: Family Medicine

## 2022-07-22 DIAGNOSIS — Z1231 Encounter for screening mammogram for malignant neoplasm of breast: Secondary | ICD-10-CM

## 2022-08-12 ENCOUNTER — Other Ambulatory Visit: Payer: 59

## 2022-08-14 ENCOUNTER — Other Ambulatory Visit (INDEPENDENT_AMBULATORY_CARE_PROVIDER_SITE_OTHER): Payer: 59

## 2022-08-14 DIAGNOSIS — E876 Hypokalemia: Secondary | ICD-10-CM | POA: Diagnosis not present

## 2022-08-14 LAB — POTASSIUM: Potassium: 3.5 mEq/L (ref 3.5–5.1)

## 2022-08-15 ENCOUNTER — Ambulatory Visit: Payer: 59 | Admitting: Internal Medicine

## 2022-08-24 LAB — ALDOSTERONE + RENIN ACTIVITY W/ RATIO
ALDO / PRA Ratio: 142.9 Ratio — ABNORMAL HIGH (ref 0.9–28.9)
Aldosterone: 20 ng/dL
Renin Activity: 0.14 ng/mL/h — ABNORMAL LOW (ref 0.25–5.82)

## 2022-08-26 ENCOUNTER — Ambulatory Visit (INDEPENDENT_AMBULATORY_CARE_PROVIDER_SITE_OTHER): Payer: 59 | Admitting: Internal Medicine

## 2022-08-26 ENCOUNTER — Encounter: Payer: Self-pay | Admitting: Internal Medicine

## 2022-08-26 VITALS — BP 130/80 | HR 96 | Ht 61.0 in | Wt 182.0 lb

## 2022-08-26 DIAGNOSIS — E269 Hyperaldosteronism, unspecified: Secondary | ICD-10-CM | POA: Diagnosis not present

## 2022-08-26 DIAGNOSIS — E042 Nontoxic multinodular goiter: Secondary | ICD-10-CM | POA: Diagnosis not present

## 2022-08-26 NOTE — Progress Notes (Unsigned)
Name: Wanda Thornton  MRN/ DOB: [id number], 05-25-1961    Age/ Sex: 61 y.o., female     PCP: [place], Betty G, MD   Reason for Endocrinology Evaluation: MNG     Initial Endocrinology Clinic Visit: 08/12/2020    PATIENT IDENTIFIER: [name] is a 61 y.o., female with a past medical history of HTN, OSA on CPAP and dyslipidemia . She has followed with [facility] since 08/12/2020 for consultative assistance with management of her MNG.   HISTORICAL SUMMARY:  Pt noted to have thyromegaly during an examination in 06/2019, which prompted a thyroid ultrasound demonstrating MNG.  She is S/P FNA of the 1.8 cm left inferior nodule with benign cytology on 09/01/2019   Denies radiation exposure      NO FH of thyroid disease  SUBJECTIVE:     Today (08/26/2022):  [name] is here for a follow up on MNG.   In reviewing labs results the pt has been noted with suppressed renin and elevated Aldo: renin ratio   He has bee noted with weight loss  She denies local neck swelling  Denies palpitations Denies loose stools or diarrhea  Denies hand tremors.    HISTORY:  Past Medical History:  Past Medical History:  Diagnosis Date   Allergy    Arthritis    fingers   Asthma    Hypertension    Sleep apnea    cpap   Past Surgical History:  Past Surgical History:  Procedure Laterality Date   BREAST BIOPSY Right    PARTIAL HYSTERECTOMY  2005   TRIGGER FINGER RELEASE     wisdom teeth     Social History:  reports that she has never smoked. She has been exposed to tobacco smoke. She has never used smokeless tobacco. She reports that she does not drink alcohol and does not use drugs. Family History:  Family History  Problem Relation Age of Onset   Hyperlipidemia Mother    Hypertension Mother    Hypertension Father    Hyperlipidemia Father    Diabetes Sister    Cancer Sister        breast   Breast cancer Sister    Diabetes Sister    Cancer  Sister        breast   Breast cancer Sister    Breast cancer Maternal Aunt    Colon cancer Neg Hx    Colon polyps Neg Hx    Esophageal cancer Neg Hx    Rectal cancer Neg Hx    Stomach cancer Neg Hx      HOME MEDICATIONS: Allergies as of 08/26/2022       Reactions   Amoxicillin Hives   Bactrim [sulfamethoxazole-trimethoprim] Swelling   Anaphylaxis    Clindamycin/lincomycin Swelling   Anaphylaxis    Hydrocodone    Nausea vomiting   Keflex [cephalexin] Swelling   Anaphylaxis    Lisinopril    headache        Medication List        Accurate as of August 26, 2022  8:01 AM. If you have any questions, ask your nurse or doctor.          albuterol 108 (90 Base) MCG/ACT inhaler Commonly known as: VENTOLIN HFA USE 2 INHALATIONS EVERY 4 TO 6 HOURS AS NEEDED FOR DYSPNEA AND WHEEZING   amLODipine 10 MG tablet Commonly known as: NORVASC TAKE 1 TABLET DAILY   B-complex with vitamin C tablet Take 1 tablet by mouth  daily.   CALCIUM CITRATE PO Take 2 tablets by mouth 2 (two) times daily.   cholecalciferol 1000 units tablet Commonly known as: VITAMIN D Take 2,000 Units by mouth daily.   CRANBERRY PO Take 1 tablet by mouth daily.   diphenhydrAMINE 25 MG tablet Commonly known as: BENADRYL Take 1 tablet (25 mg total) by mouth every 6 (six) hours for 1 day.   fexofenadine 180 MG tablet Commonly known as: ALLEGRA Take 180 mg by mouth daily.   fluticasone 50 MCG/ACT nasal spray Commonly known as: FLONASE USE 2 SPRAYS IN EACH NOSTRIL DAILY AS NEEDED FOR ALLERGIES   fluticasone-salmeterol 500-50 MCG/ACT Aepb Commonly known as: Advair Diskus USE 1 INHALATION IN THE MORNING AND AT BEDTIME Rinse mouth   Klor-Con M20 20 MEQ tablet Generic drug: potassium chloride SA TAKE 1 TABLET TWICE A DAY   montelukast 10 MG tablet Commonly known as: SINGULAIR TAKE 1 TABLET AT BEDTIME          OBJECTIVE:   PHYSICAL EXAM: VS: BP 130/80 (BP Location: Left Arm, Patient  Position: Sitting, Cuff Size: Large)   Pulse 96   Ht '5\' 1"'$  (1.549 m)   Wt 182 lb (82.6 kg)   SpO2 97%   BMI 34.39 kg/m    EXAM: General: Pt appears well and is in NAD  Neck: General: Supple without adenopathy. Thyroid: Right thyroid nodule appreciated.   Lungs: Clear with good BS bilat with no rales, rhonchi, or wheezes  Heart: Auscultation: RRR.  Abdomen: Normoactive bowel sounds, soft, nontender, without masses or organomegaly palpable  Extremities:  BL LE: No pretibial edema normal ROM and strength.  Mental Status: Judgment, insight: Intact Orientation: Oriented to time, place, and person Mood and affect: No depression, anxiety, or agitation     DATA REVIEWED:   Latest Reference Range & Units 07/19/22 08:28  TSH 0.35 - 5.50 uIU/mL 1.61     FNA 09/01/2019 Clinical History: Nodule 2 Left Inferior 1.8 cm; other 2 dimensions: 1.3  x 0.8 cm, Solid / almost completely solid, Hypoechoic, ACR TI-RADS total  points: 4, Moderately suspicious nodule  Specimen Submitted:  A. THYROID, LT LOBE LLP, FINE NEEDLE ASPIRATION:    FINAL MICROSCOPIC DIAGNOSIS:  - Consistent with benign follicular nodule (Bethesda category II)     Thyroid Ultrasound 08/29/2021  Estimated total number of nodules >/= 1 cm: 1   Number of spongiform nodules >/=  2 cm not described below (TR1): 0   Number of mixed cystic and solid nodules >/= 1.5 cm not described below ([place]): 0   _________________________________________________________   Again seen are a few scattered nodules measuring less than 1 cm in size within the thyroid gland. These remain similar in size and morphology and do not meet criteria for further dedicated follow-up or biopsy.   Nodule labeled 4 (labeled 3 on previous exam) refers to a solid predominantly isoechoic nodule in the inferior aspect of the left thyroid lobe which measures 1.3 x 0.8 x 0.6 cm on today's exam, previously 1.4 x 0.8 x 0.7 cm. It was previously biopsied  in December 2020.   IMPRESSION: 1. Multinodular thyroid gland. No new suspicious thyroid nodules requiring dedicated follow-up or biopsy. 2. The previously biopsied nodule in the inferior left thyroid lobe remains similar in size and appearance. Correlate with biopsy results.   FNA left inferior 09/01/2019   Clinical History: Nodule 2 Left Inferior 1.8 cm; other 2 dimensions: 1.3 x 0.8 cm, Solid / almost completely solid, Hypoechoic, ACR TI-RADS total  points: 4, Moderately suspicious nodule Specimen Submitted:  A. THYROID, LT LOBE LLP, FINE NEEDLE ASPIRATION:   FINAL MICROSCOPIC DIAGNOSIS: - Consistent with benign follicular nodule (Bethesda category II)    ASSESSMENT / PLAN / RECOMMENDATIONS:   MNG:  - No local neck symptoms - She is clinically and biochemically euthyroid  - S/P benign FNA of the left inferior 1.8 cm nodule  - Will repeat thyroid ultrasound this year   F/U in 1 yr    Signed electronically by: Mack Guise, MD  [facility]  [facility] [address] [place], [place] [address] Phone: [phone] FAX: [phone]      CC: Wanda Thornton, [place] [address] Phone: [phone]  Fax: [phone]   Return to Endocrinology clinic as below: Future Appointments  Date Time Provider [place]  09/12/2022  9:15 AM [facility]  03/03/2023  9:30 AM Deneise Lever, MD [facility] None

## 2022-08-26 NOTE — Patient Instructions (Signed)
We will schedule you at the Infusion suite

## 2022-08-27 ENCOUNTER — Telehealth: Payer: Self-pay | Admitting: Internal Medicine

## 2022-08-27 ENCOUNTER — Encounter: Payer: Self-pay | Admitting: Internal Medicine

## 2022-08-27 NOTE — Telephone Encounter (Signed)
Appointment schedule for 08/29/22 at 8am. Instructions have been faxed and patient made aware of appointment. She will contact the clinic is she needs to change date. Patient also advised that she needs to fast for this appointment only water.

## 2022-08-27 NOTE — Telephone Encounter (Signed)
Can you please schedule the patient for a saline loading test at the infusion suite on [address]?  Phone number [phone]   Please make sure this is scheduled at 8 AM.  Patient needs to be fasting (she can have water only before the test)   Please let the patient know when this is scheduled   Thanks

## 2022-08-27 NOTE — Telephone Encounter (Signed)
Left vm at infusion center to callback so we can schedule patient for Saline Loading

## 2022-08-28 ENCOUNTER — Ambulatory Visit
Admission: RE | Admit: 2022-08-28 | Discharge: 2022-08-28 | Disposition: A | Discharge status: Home / Self Care | Payer: 59 | Source: Ambulatory Visit | Attending: Internal Medicine | Admitting: Internal Medicine

## 2022-08-28 DIAGNOSIS — E042 Nontoxic multinodular goiter: Secondary | ICD-10-CM

## 2022-08-29 ENCOUNTER — Encounter (HOSPITAL_COMMUNITY): Payer: 59

## 2022-09-05 ENCOUNTER — Telehealth: Payer: Self-pay | Admitting: Family Medicine

## 2022-09-05 ENCOUNTER — Other Ambulatory Visit (HOSPITAL_COMMUNITY): Payer: Self-pay | Admitting: *Deleted

## 2022-09-05 DIAGNOSIS — E222 Syndrome of inappropriate secretion of antidiuretic hormone: Secondary | ICD-10-CM

## 2022-09-05 NOTE — Telephone Encounter (Signed)
Pt called to say she has an infusion tomorrow and she cannot remember if she needs to fast or not. She thinks MD told her 12 hrs, but she is not sure. Pt called the infusion clinic and was told to ask her PCP.  MD is out of the office today.  Please advise.

## 2022-09-06 ENCOUNTER — Ambulatory Visit (HOSPITAL_COMMUNITY)
Admission: RE | Admit: 2022-09-06 | Discharge: 2022-09-06 | Disposition: A | Discharge status: Home / Self Care | Payer: 59 | Source: Ambulatory Visit | Attending: Internal Medicine | Admitting: Internal Medicine

## 2022-09-06 DIAGNOSIS — E222 Syndrome of inappropriate secretion of antidiuretic hormone: Secondary | ICD-10-CM | POA: Insufficient documentation

## 2022-09-06 LAB — CORTISOL
Cortisol, Plasma: 14.5 ug/dL
Cortisol, Plasma: 5.3 ug/dL

## 2022-09-06 MED ORDER — SODIUM CHLORIDE 0.9 % IV SOLN
INTRAVENOUS | Status: DC
  Administered 2022-09-06: 2000 mL via INTRAVENOUS

## 2022-09-06 NOTE — Progress Notes (Signed)
Pt first set of labs conssiting of aldosterone, renin, and cortisol drawn at 0815 and sent to the lab.  Second set of labs drawn at 1235 consisting of aldosterone, renin, and cortisol walked to the lab.  Spoke with lab team member and let them know this was not a duplicate and indeed needed a second set of these labs to be processed.  Lab team was able to find the orders in the system and process them while I was standing there.

## 2022-09-12 ENCOUNTER — Ambulatory Visit: Payer: 59

## 2022-09-13 LAB — ALDOSTERONE + RENIN ACTIVITY W/ RATIO
ALDO / PRA Ratio: 110.3 — ABNORMAL HIGH (ref 0.0–30.0)
ALDO / PRA Ratio: 19.7 (ref 0.0–30.0)
Aldosterone: 27.9 ng/dL (ref 0.0–30.0)
Aldosterone: 3.4 ng/dL (ref 0.0–30.0)
PRA LC/MS/MS: 0.253 ng/mL/hr (ref 0.167–5.380)
PRA LC/MS/MS: 0.173 ng/mL/hr (ref 0.167–5.380)

## 2022-09-18 ENCOUNTER — Ambulatory Visit (INDEPENDENT_AMBULATORY_CARE_PROVIDER_SITE_OTHER): Payer: 59

## 2022-09-18 DIAGNOSIS — Z23 Encounter for immunization: Secondary | ICD-10-CM | POA: Diagnosis not present

## 2022-10-04 ENCOUNTER — Ambulatory Visit: Payer: 59 | Admitting: Internal Medicine

## 2022-11-04 IMAGING — US US THYROID
1 series · 13 of 25 positions shown · non-contrast
Comparison: Multiple priors, most recently August 2020

CLINICAL DATA: Prior ultrasound follow-up.

EXAM:
THYROID ULTRASOUND
TECHNIQUE: Ultrasound examination of the thyroid gland and adjacent soft
tissues was performed.

[Series 1: us thyroid · 0.08mm/px · 13 of 56 slices shown]
[im 1/56]
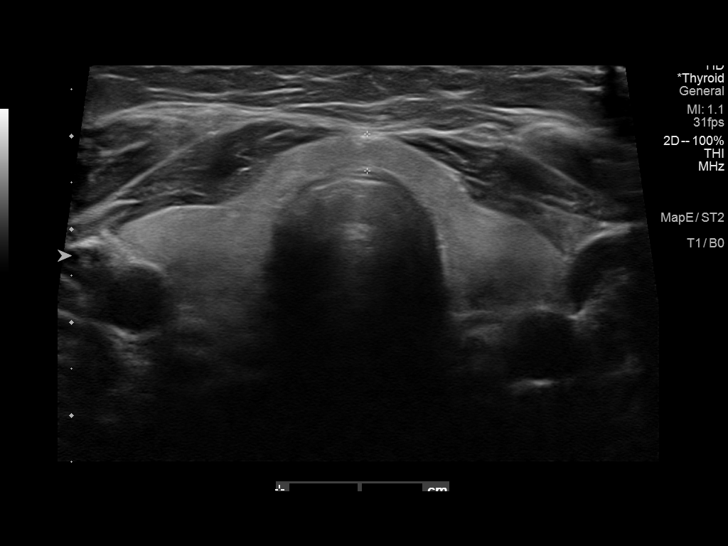
[im 5/56]
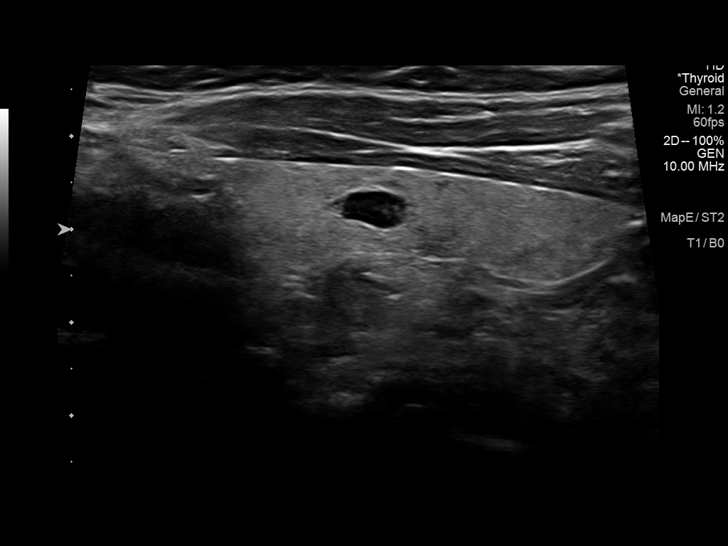
[im 10/56]
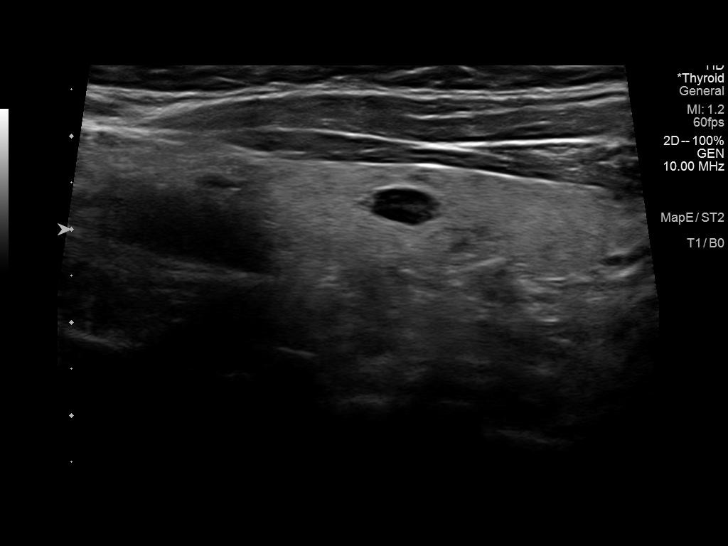
[im 14/56]
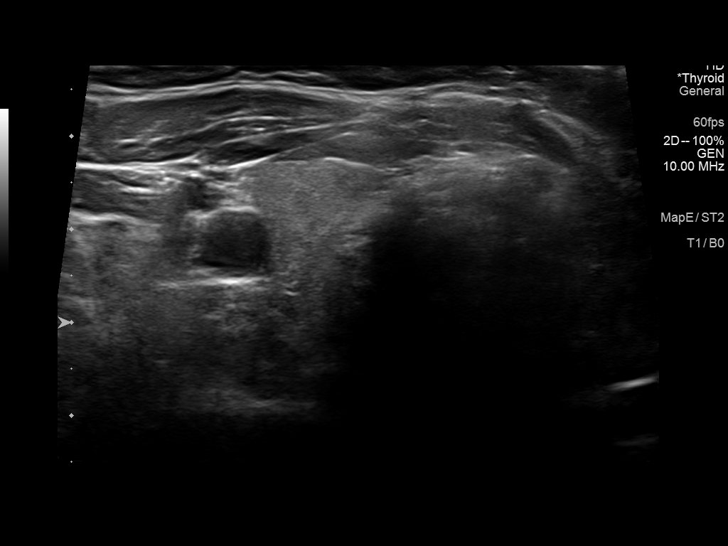
[im 19/56]
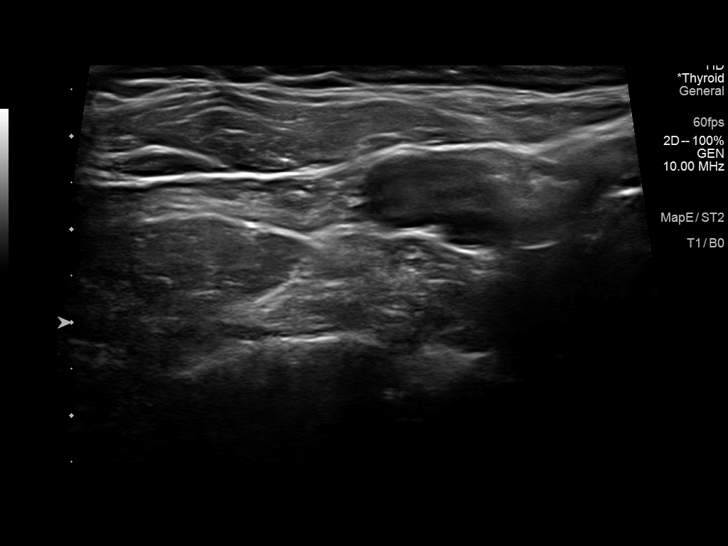
[im 23/56]
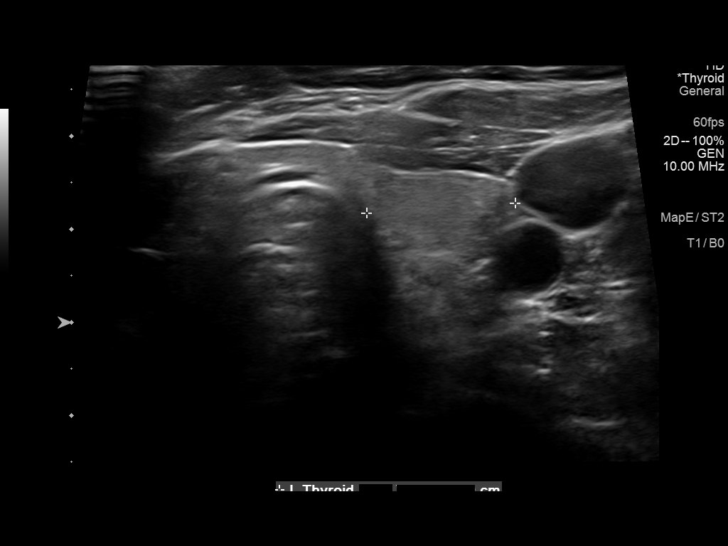
[im 28/56]
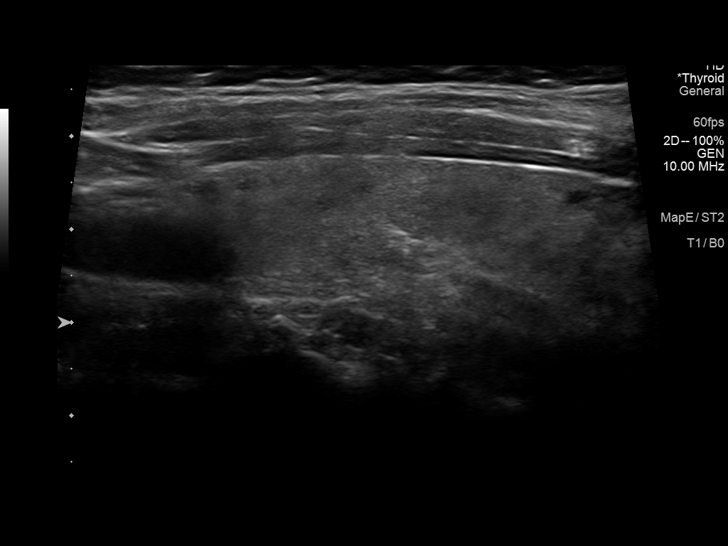
[im 33/56]
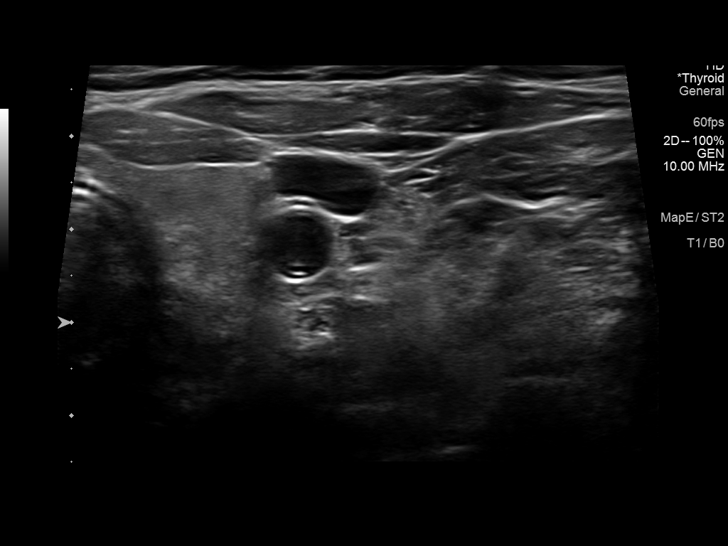
[im 37/56]
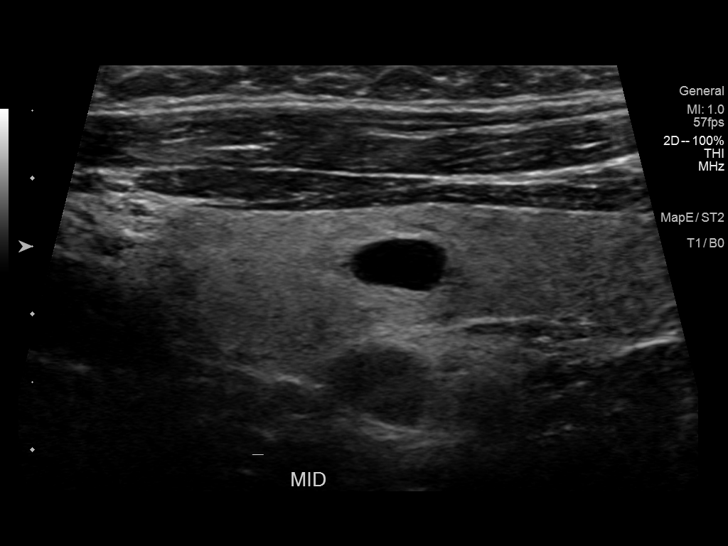
[im 42/56]
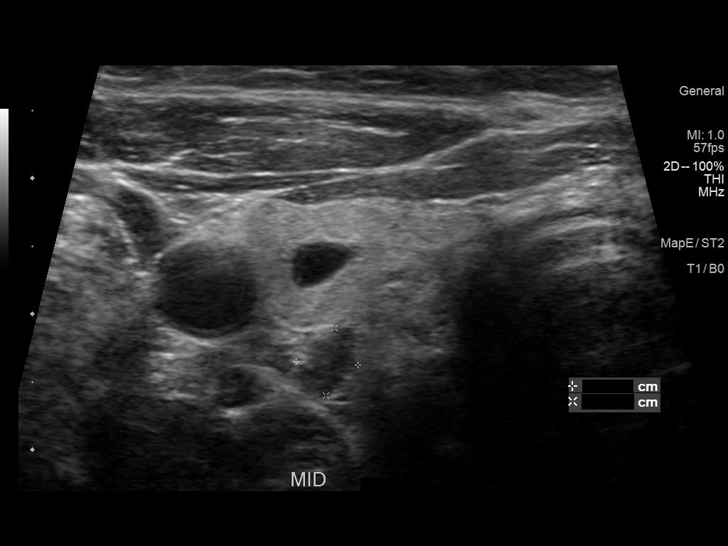
[im 46/56]
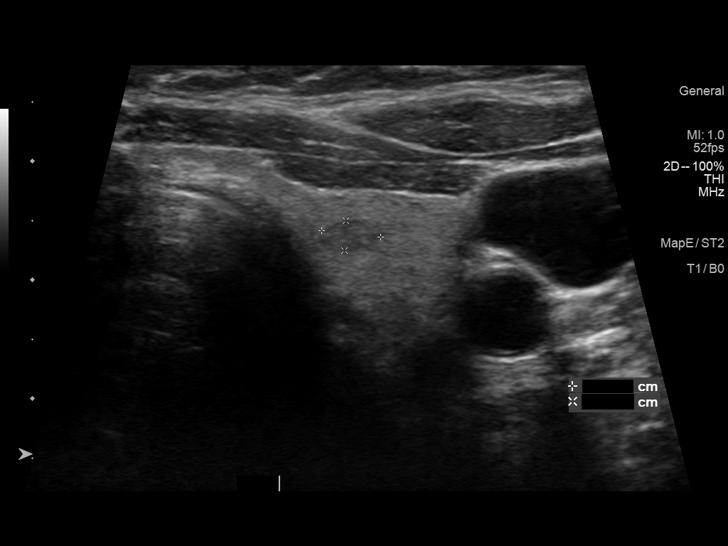
[im 51/56]
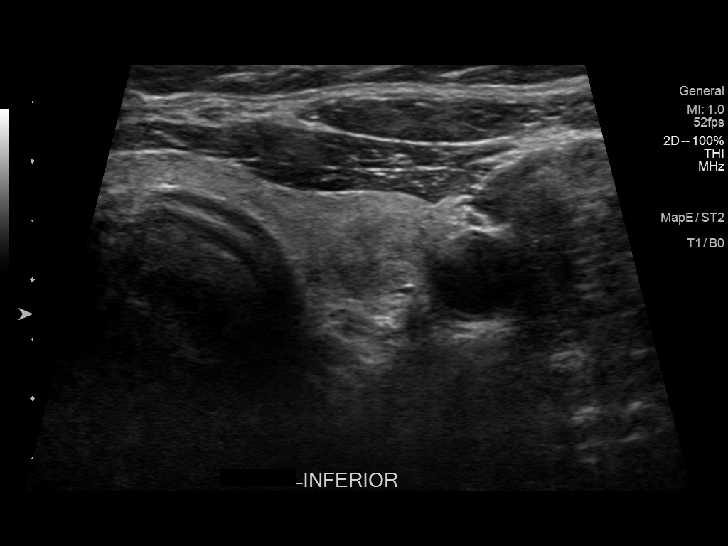
[im 56/56]
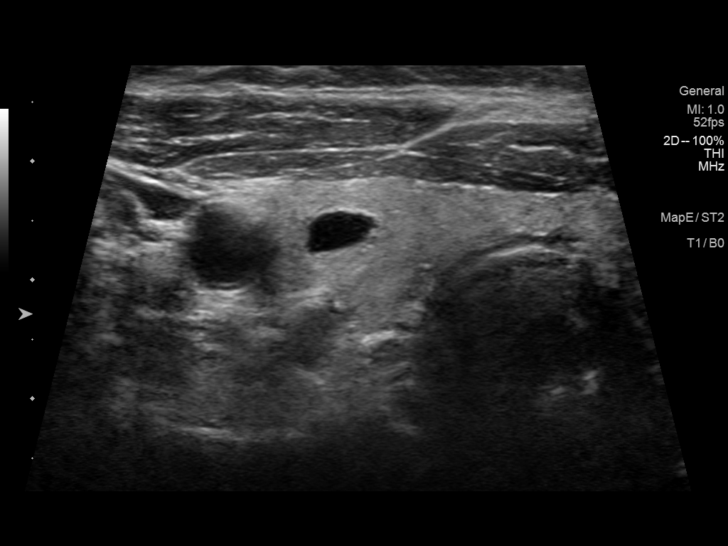

[13 of 25 positions shown; findings below may reference images not displayed]

FINDINGS: Parenchymal Echotexture: Normal

Isthmus: 0.4 cm

Right lobe: 4.7 x 1.9 x 1.9 cm

Left lobe: 4.5 x 1.6 x 1.6 cm

_________________________________________________________

Estimated total number of nodules >/= 1 cm: 1

Number of spongiform nodules >/=  2 cm not described below (TR1): 0

Number of mixed cystic and solid nodules >/= 1.5 cm not described
below (TR2): 0

_________________________________________________________

Again seen are a few scattered nodules measuring less than 1 cm in
size within the thyroid gland. These remain similar in size and
morphology and do not meet criteria for further dedicated follow-up
or biopsy.

Nodule labeled 4 (labeled 3 on previous exam) refers to a solid
predominantly isoechoic nodule in the inferior aspect of the left
thyroid lobe which measures 1.3 x 0.8 x 0.6 cm on today's exam,
previously 1.4 x 0.8 x 0.7 cm. It was previously biopsied in
August 2019.
IMPRESSION: 1. Multinodular thyroid gland. No new suspicious thyroid nodules
requiring dedicated follow-up or biopsy.
2. The previously biopsied nodule in the inferior left thyroid lobe
remains similar in size and appearance. Correlate with biopsy
results.

The above is in keeping with the ACR TI-RADS recommendations - [HOSPITAL] 9398;[DATE].

## 2022-11-14 ENCOUNTER — Ambulatory Visit: Payer: 59 | Admitting: Family Medicine

## 2022-11-15 ENCOUNTER — Encounter: Payer: Self-pay | Admitting: Family Medicine

## 2022-11-15 ENCOUNTER — Ambulatory Visit (INDEPENDENT_AMBULATORY_CARE_PROVIDER_SITE_OTHER): Payer: 59 | Admitting: Family Medicine

## 2022-11-15 VITALS — BP 120/72 | HR 76 | Temp 99.1°F | Resp 16 | Ht 61.0 in | Wt 177.4 lb

## 2022-11-15 DIAGNOSIS — I10 Essential (primary) hypertension: Secondary | ICD-10-CM | POA: Diagnosis not present

## 2022-11-15 DIAGNOSIS — S86012A Strain of left Achilles tendon, initial encounter: Secondary | ICD-10-CM

## 2022-11-15 DIAGNOSIS — M7662 Achilles tendinitis, left leg: Secondary | ICD-10-CM | POA: Diagnosis not present

## 2022-11-15 MED ORDER — DICLOFENAC SODIUM 75 MG PO TBEC: 75.0000 mg | DELAYED_RELEASE_TABLET | Freq: Two times a day (BID) | ORAL | 0 refills | Status: AC

## 2022-11-15 NOTE — Progress Notes (Signed)
ACUTE VISIT Chief Complaint  Patient presents with   Ankle Pain    Left ankle, over a week; does walking x per week.    HPI: Wanda Thornton is a 62 y.o. female with past medical history significant for hypertension, OSA on CPAP, seasonal allergies, hyperlipidemia, sarcoidosis, vitamin D deficiency here today complaining of left ankle pain for over a week. The pain is located at the back of the heel and he thinks sometimes radiated to her calf.She reports limping after walking for about 12 minutes. Achy like pain, intermittent. Pain began after she tripped while walking in the mall.   Ankle Pain  The incident occurred more than 1 week ago. The injury mechanism was a twisting injury. The pain is present in the left ankle. The pain is at a severity of 5/10. The pain is moderate. The pain has been Intermittent since onset. Pertinent negatives include no inability to bear weight, loss of motion, loss of sensation, muscle weakness, numbness or tingling.  Pain is exacerbated by prolonged walking and evaluated by rest. Problem has been stable.  She has decreased exercise due to pain, it does not interfere with her regular physical activities. There is no reported swelling or erythema in the area.  She has not taken any medication for pain relief.  Hypertension on amlodipine 10 mg daily.  Lab Results  Component Value Date   CREATININE 0.72 07/19/2022   BUN 12 07/19/2022   NA 142 07/19/2022   K 3.5 08/14/2022   CL 105 07/19/2022   CO2 27 07/19/2022   Review of Systems  Respiratory:  Negative for shortness of breath.   Cardiovascular:  Negative for chest pain, palpitations and leg swelling.  Gastrointestinal:  Negative for abdominal pain, nausea and vomiting.  Neurological:  Negative for tingling, weakness and numbness.  See other pertinent positives and negatives in HPI.  Current Outpatient Medications on File Prior to Visit  Medication Sig Dispense Refill   albuterol  (VENTOLIN HFA) 108 (90 Base) MCG/ACT inhaler USE 2 INHALATIONS EVERY 4 TO 6 HOURS AS NEEDED FOR DYSPNEA AND WHEEZING 25.5 g 3   amLODipine (NORVASC) 10 MG tablet TAKE 1 TABLET DAILY 90 tablet 3   B Complex-C (B-COMPLEX WITH VITAMIN C) tablet Take 1 tablet by mouth daily.     CALCIUM CITRATE PO Take 2 tablets by mouth 2 (two) times daily.     cholecalciferol (VITAMIN D) 1000 units tablet Take 2,000 Units by mouth daily.      CRANBERRY PO Take 1 tablet by mouth daily.     fexofenadine (ALLEGRA) 180 MG tablet Take 180 mg by mouth daily.     fluticasone (FLONASE) 50 MCG/ACT nasal spray USE 2 SPRAYS IN EACH NOSTRIL DAILY AS NEEDED FOR ALLERGIES 48 g 3   fluticasone-salmeterol (ADVAIR DISKUS) 500-50 MCG/ACT AEPB USE 1 INHALATION IN THE MORNING AND AT BEDTIME Rinse mouth 180 each 1   KLOR-CON M20 20 MEQ tablet TAKE 1 TABLET TWICE A DAY 180 tablet 3   montelukast (SINGULAIR) 10 MG tablet TAKE 1 TABLET AT BEDTIME 90 tablet 3   diphenhydrAMINE (BENADRYL) 25 MG tablet Take 1 tablet (25 mg total) by mouth every 6 (six) hours for 1 day. 4 tablet 0   No current facility-administered medications on file prior to visit.    Past Medical History:  Diagnosis Date   Allergy    Arthritis    fingers   Asthma    Hypertension    Sleep apnea  cpap   Allergies  Allergen Reactions   Amoxicillin Hives   Bactrim [Sulfamethoxazole-Trimethoprim] Swelling    Anaphylaxis    Clindamycin/Lincomycin Swelling    Anaphylaxis    Hydrocodone     Nausea vomiting   Keflex [Cephalexin] Swelling    Anaphylaxis    Lisinopril     headache    Social History   Socioeconomic History   Marital status: Married    Spouse name: Not on file   Number of children: Not on file   Years of education: Not on file   Highest education level: Not on file  Occupational History   Not on file  Tobacco Use   Smoking status: Never    Passive exposure: Yes   Smokeless tobacco: Never  Vaping Use   Vaping Use: Never used   Substance and Sexual Activity   Alcohol use: No   Drug use: No   Sexual activity: Yes  Other Topics Concern   Not on file  Social History Narrative   Not on file   Social Determinants of Health   Financial Resource Strain: Not on file  Food Insecurity: Not on file  Transportation Needs: Not on file  Physical Activity: Not on file  Stress: Not on file  Social Connections: Not on file   Vitals:   11/15/22 1224  BP: 120/72  Pulse: 76  Resp: 16  Temp: 99.1 F (37.3 C)  SpO2: 99%   Body mass index is 33.51 kg/m.  Physical Exam Vitals and nursing note reviewed.  Constitutional:      General: She is not in acute distress.    Appearance: She is well-developed. She is not ill-appearing.  HENT:     Head: Normocephalic and atraumatic.  Eyes:     Conjunctiva/sclera: Conjunctivae normal.  Cardiovascular:     Rate and Rhythm: Normal rate and regular rhythm.     Pulses:          Dorsalis pedis pulses are 2+ on the right side and 2+ on the left side.     Comments: There is no left calf tenderness with foot dorsiflexion or palpation. Pulmonary:     Effort: Pulmonary effort is normal. No respiratory distress.  Musculoskeletal:     Left ankle: No tenderness. Normal range of motion.     Left Achilles Tendon: Tenderness present. Thompson's test negative.       Feet:  Skin:    General: Skin is warm.     Findings: No erythema or rash.  Neurological:     General: No focal deficit present.     Mental Status: She is alert and oriented to person, place, and time.     Gait: Gait normal.  Psychiatric:        Mood and Affect: Mood and affect normal.   ASSESSMENT AND PLAN:  Wanda Thornton is a 62 year old female seen today for left ankle pain.  Strain of left Achilles tendon, initial encounter Examination does not suggest a serious process. I do not think imaging is needed at this time. Will recommend evaluation by sports medicine if pain has not resolved in a couple weeks.  -      Diclofenac Sodium; Take 1 tablet (75 mg total) by mouth 2 (two) times daily for 10 days.  Dispense: 20 tablet; Refill: 0  Achilles tendinitis of left lower extremity Versus Achilles bursitis. After discussion of some side effects, she agrees with trying diclofenac 75 mg twice daily with food for 7 to 10 days. PT  exercises recommended,she can try at home, list given on AVS. Comfortable shoe wear.  Hypertension, essential, benign BP adequately controlled. Recommend monitoring BP closely while taking NSAIDs. Continue amlodipine 10 mg daily and low-salt diet.  -     Diclofenac Sodium; Take 1 tablet (75 mg total) by mouth 2 (two) times daily for 10 days.  Dispense: 20 tablet; Refill: 0  Return if symptoms worsen or fail to improve, for keep next appointment.  Kenzel Ruesch G. [place], MD  [facility]. [place] office.

## 2022-11-15 NOTE — Patient Instructions (Addendum)
A few things to remember from today's visit:  Achilles tendinitis of left lower extremity - Plan: diclofenac (VOLTAREN) 75 MG EC tablet  Strain of left Achilles tendon, initial encounter - Plan: diclofenac (VOLTAREN) 75 MG EC tablet  Diclofenac with food for 7-10 days. Monitor blood pressure closely. If not resolved in a couple week, sport medicine appt can be arranged.  [facility] intensive rehabilitation of Achilles tendinopathy  1. Eccentric heel drop description a. Stand with the heel of the affected foot beyond the edge of the step or platform with the foot plantar flexed. Slowly lower the heel, bringing the foot into dorsiflexion. b. Perform the exercise both with the knee straight (gastrocnemius) and with the knee bent 45 degrees (soleus). c. Avoid concentric exercise by raising the foot back to the plantar flexed starting position using the unaffected foot, and hands if a railing is available. 2. Number of exercises a. Perform 3 sets of 15 repetitions with straight knees, then 3 sets of 15 repetitions with bent knees. b. Perform this cycle twice daily (180 drops/day). c. Continue the program 7 days per week for 12 to 24 weeks If you need refills for medications you take chronically, please call your pharmacy. Do not use My Chart to request refills or for acute issues that need immediate attention. If you send a my chart message, it may take a few days to be addressed, specially if I am not in the office.  Please be sure medication list is accurate. If a new problem present, please set up appointment sooner than planned today.

## 2023-01-05 ENCOUNTER — Encounter: Payer: Self-pay | Admitting: Internal Medicine

## 2023-01-05 DIAGNOSIS — G4733 Obstructive sleep apnea (adult) (pediatric): Secondary | ICD-10-CM

## 2023-01-06 NOTE — Telephone Encounter (Signed)
AMB DME order placed. Nothing further needed at this time.

## 2023-01-06 NOTE — Telephone Encounter (Signed)
Dr. Maple Hudson please advise on the following My Chart message:   Fran Lowes Isakson  [facility] (supporting Waymon Budge, MD)20 hours ago (2:15 PM)    Dr. Maple Hudson.   Saturday I viewed my stats for my CPAP visa My Air on my phone.  I realized that I have not been sleeping as normal. What I discovered that the CPAP is showing consistently over the last 30 days that I have 10 Mask off instances. The max I should have are 3 or 4 for going to the bathroom and adjusting the thermostat. It is also not recording my sleep time correctly.  My husband also stated he has noticed my snoring more.  I called [employer] and she said that the CPAP shows that it is cutting off and on.  This worries me and I believe it is a defect.  Saturday night I switched back to my old one that hums loudly as I breath in (which is why we change to the current one) aside from the noise it recorded my sleep hours and my instances of stop breathing were only 0.20.  The lady at [facility] said she would have someone call me. But I believe I need a new CPAP.  I do not want to continue using one that cuts off while I am sleeping.  I am very worried.  Thanks, Manya Silvas  Thank you

## 2023-01-06 NOTE — Telephone Encounter (Signed)
Order- DME Christoper Allegra- please service or replace CPAP machine. It is cutting off during the night.

## 2023-02-05 ENCOUNTER — Encounter: Payer: Self-pay | Admitting: Family Medicine

## 2023-02-05 MED ORDER — FLUTICASONE PROPIONATE 50 MCG/ACT NA SUSP: NASAL | 3 refills | Status: DC

## 2023-02-28 NOTE — Progress Notes (Unsigned)
HPI female never smoker followed for asthma, allergic rhinitis,  OSA Allergy Profile 02/13/17- total IgE 66, specific elevations for Timothy grass and Elm pollen. Eosinophils normal 0.3 K/UL NPSG [facility], [place]/22/15-AHI 11.8/hour, desaturation to 82%, body weight 167 pounds Lab 7/22- IgE 73, EOS 400 PFT- 03/01/22-WNL ACE level 25 11/01/21 Hypersensitivity Pneumonia Panel 10/26/20-  Component Ref Range & Units 1 yr ago  A.Fumigatus #1 Abs Negative Negative   Micropolyspora faeni, IgG Negative Negative   Thermoactinomyces vulgaris, IgG Negative Negative   A. Pullulans Abs Negative Negative   Thermoact. Saccharii Negative Negative   Pigeon Serum Abs Negative Negative   Resulting Agency    HR[facility] 2/20/222-. Findings are not favored to represent interstitial lung disease, but may represent sequela of chronic indolent atypical infectious process. Alternatively, these findings could be seen in the setting of a systemic disease such as sarcoidosis.  ==================================================================   03/03/23- 62 -year-old female never smoker followed for Asthma, Allergic Rhinitis,  OSA, Pulmonary Fibrosis/ atypical, complicated by Obesity, HTN, Thyroid nodules, SIADH,  -Allegra, Advair 500, Albuterol hfa, Flonase, Singulair, CPAP auto 5-15/ [employer] Download- compliance 100%, AHI 0.8/ hr Body weight today- She has been concerned but it sounds as if her CPAP app is telling her that her mask seal fails occasionally, not that her machine cuts off.                                                                                   Download reviewed. Asthma control is been excellent.  She uses Advair daily, needing refill.  We discussed dropping down to Advair 250.  She uses rescue inhaler only occasionally. 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  ACE was within normal when checked but burned-out sarcoid is not excluded.  ROS-see HPI    "+" = positive  Constitutional:    weight  loss, night sweats, fevers, chills, fatigue, lassitude. HEENT:    headaches, difficulty swallowing, tooth/dental problems, sore throat,       + sneezing, +itching, ear ache,  +nasal congestion, post nasal drip, snoring CV:    chest pain, orthopnea, PND, swelling in lower extremities, anasarca,                                                      dizziness, palpitations Resp:   + shortness of breath with exertion or at rest.                +productive cough,   non-productive cough, coughing up of blood.              change in color of mucus.  +wheezing.   Skin:    rash or lesions. GI:  No-   heartburn, indigestion, abdominal pain, nausea, vomiting, diarrhea,                 change in bowel habits, loss of appetite GU: dysuria, change in color of urine, no urgency or frequency.   flank pain. MS:   joint pain, stiffness, decreased range  of motion, back pain. Neuro-     nothing unusual Psych:  change in mood or affect.  depression or anxiety.   memory loss.  OBJ- Physical Exam   General- Alert, Oriented, Affect-appropriate, Distress- none acute, + overweight Skin- rash-none, lesions- none, excoriation- none Lymphadenopathy- none Head- atraumatic            Eyes- Gross vision intact, PERRLA, conjunctivae and secretions clear            Ears- Hearing, canals-normal            Nose-  turbinate edema, no-Septal dev, mucus, polyps, erosion, perforation             Throat- Mallampati III-IV , mucosa clear , drainage- none, tonsils- atrophic Neck- flexible , trachea midline, no stridor , thyroid nl, carotid no bruit Chest - symmetrical excursion , unlabored           Heart/CV- RRR , no murmur , no gallop  , no rub, nl s1 s2                           - JVD- none , edema- none, stasis changes- none, varices- none           Lung- clear to P&A, wheeze- none, cough- none , dullness-none, rub- none           Chest wall-  Abd-  Br/ Gen/ Rectal- Not done, not indicated Extrem- cyanosis- none, clubbing,  none, atrophy- none, strength- nl Neuro- grossly intact to observation

## 2023-03-03 ENCOUNTER — Encounter: Payer: Self-pay | Admitting: Internal Medicine

## 2023-03-03 ENCOUNTER — Ambulatory Visit (INDEPENDENT_AMBULATORY_CARE_PROVIDER_SITE_OTHER): Payer: 59

## 2023-03-03 ENCOUNTER — Ambulatory Visit (INDEPENDENT_AMBULATORY_CARE_PROVIDER_SITE_OTHER): Payer: 59 | Admitting: Internal Medicine

## 2023-03-03 VITALS — BP 130/74 | HR 99 | Temp 98.5°F | Ht 62.0 in | Wt 177.0 lb

## 2023-03-03 DIAGNOSIS — J84112 Idiopathic pulmonary fibrosis: Secondary | ICD-10-CM

## 2023-03-03 DIAGNOSIS — J4540 Moderate persistent asthma, uncomplicated: Secondary | ICD-10-CM | POA: Diagnosis not present

## 2023-03-03 DIAGNOSIS — G4733 Obstructive sleep apnea (adult) (pediatric): Secondary | ICD-10-CM

## 2023-03-03 MED ORDER — FLUTICASONE-SALMETEROL 250-50 MCG/ACT IN AEPB: INHALATION_SPRAY | RESPIRATORY_TRACT | 4 refills | Status: DC

## 2023-03-03 NOTE — Assessment & Plan Note (Addendum)
Continues uncomplicated but benefits from having a maintenance inhaler.  We think she can drop down to Advair 250-prescription sent There is old nonspecific scarring.  ACE was normal.  Burned-out sarcoid is a possibility. Plan-CXR

## 2023-03-03 NOTE — Assessment & Plan Note (Signed)
Benefits from CPAP with good compliance and control Plan-continue auto 5-15.  I suggested she work with DME to refit mask if there is a question.

## 2023-03-03 NOTE — Patient Instructions (Signed)
Order- CXR dx pulmonary fibrosis  We can continue CPAP auto 5-15  Script sent changing Advair to 250

## 2023-03-04 NOTE — Telephone Encounter (Signed)
I have received the following Mychart message from patient   "Hi Dr. Maple Hudson,    I see that you ordered a test for CXR dx pulmonary fibrosis. What is it for, we did not discuss this during my appointment?  The only test I knew about was the follow up Xray that I took today.    Separately, I also submitted questions regarding health symptoms etc., items in the after visit summary that do not apply to me.  Not sure if when I 1) read it incorrectly, 2) when I registered online I answer the questions incorrectly or 3) these belong to someone else.  For example, I have never coughed up blood, I do not have dizziness etc. "  I believe pt it confused after reading AVS she believes you believe she has experienced everything listed under " ROS-see HPI "+" = positive " even if it does not have a + next to it, and she wants to clarify that she does not. I have tried to explain to the patient what this means. Pt Stated she does not have Pulmonary fibrosis only asthma. I have informed pt, I believe you wanted a cxr to check for pulmonary fibrosis.   Please advise on how I should respond to this pt.

## 2023-03-04 NOTE — Telephone Encounter (Signed)
Her chest xray is watching areas of scarring in her lung to see if they are progressing in a pattern called pulmonary fibrosis or interstitial lung disease ("ILD"). Marking that on the xray request tells the radiologist what I am watching out for. It is not a diagnosis.  Also, as you indicated, the ROS "review of systems" is a list of possible complaints. I mark with a + sign some points that may be pertinent for that or other visits. It again is not a diagnosis or definite findings.

## 2023-03-07 NOTE — Telephone Encounter (Signed)
I spoke with the pt and she is asking if she could get further explanation on her pulmonary fibrosis  She was asking questions about her life expectancy   She asks if we can refer to someone here who specializes in PF   Are you okay with her seeing Dr. Isaiah Serge or Marchelle Gearing?

## 2023-03-09 NOTE — Telephone Encounter (Signed)
Order- referral to either Dr Marchelle Gearing or Dr De Hollingshead - consultation on interstitial lung disease.

## 2023-03-11 NOTE — Telephone Encounter (Signed)
Spoke with the pt and scheduled appt for 03/26/23 with MR

## 2023-03-20 ENCOUNTER — Telehealth: Payer: Self-pay | Admitting: Internal Medicine

## 2023-03-20 ENCOUNTER — Other Ambulatory Visit: Payer: Self-pay

## 2023-03-20 MED ORDER — PREDNISONE 10 MG PO TABS: 10.0000 mg | ORAL_TABLET | Freq: Every day | ORAL | 0 refills | Status: DC

## 2023-03-20 NOTE — Telephone Encounter (Signed)
Pt. Calling and having allergy symp. And needs medical advise on what else she can take

## 2023-03-20 NOTE — Telephone Encounter (Signed)
Spoke with patient. She complains of sneezing, nasal congestion, itchy throat and ears, productive cough with clear phlegm Congestion is worse at night  Symptoms present for several days Patient has been using Benadryl and allegra [facility]acy [facility] [address]   Dr. Maple Hudson please advise?

## 2023-03-20 NOTE — Telephone Encounter (Signed)
Suggest prednisone 10 mg, # 10    2 daily x 3 days, then one daily Continue antihistamine as needed - Benadryl or Chlortrimeton may be a little stronger than Allegra, but will cause more drowsiness.  Rinse mouth well to avoid Thrush since also using Advair.

## 2023-03-20 NOTE — Telephone Encounter (Signed)
Spoke with patient. Advised prednisone has been sent to pharmacy. And went over recc from Dr. Maple Hudson. She verbalized understanding. NFN

## 2023-03-26 ENCOUNTER — Encounter: Payer: Self-pay | Admitting: Internal Medicine

## 2023-03-26 ENCOUNTER — Ambulatory Visit (INDEPENDENT_AMBULATORY_CARE_PROVIDER_SITE_OTHER): Payer: 59 | Admitting: Internal Medicine

## 2023-03-26 VITALS — BP 140/90 | HR 118 | Ht 62.0 in | Wt 175.2 lb

## 2023-03-26 DIAGNOSIS — J3089 Other allergic rhinitis: Secondary | ICD-10-CM

## 2023-03-26 DIAGNOSIS — R918 Other nonspecific abnormal finding of lung field: Secondary | ICD-10-CM

## 2023-03-26 DIAGNOSIS — J302 Other seasonal allergic rhinitis: Secondary | ICD-10-CM

## 2023-03-26 DIAGNOSIS — G4733 Obstructive sleep apnea (adult) (pediatric): Secondary | ICD-10-CM

## 2023-03-26 DIAGNOSIS — J4540 Moderate persistent asthma, uncomplicated: Secondary | ICD-10-CM | POA: Diagnosis not present

## 2023-03-26 NOTE — Patient Instructions (Addendum)
Abnormal CT scan of lung Multiple lung nodules on CT  -The CT scan (2022) does not look like classic pulmonary fibrosis for me.  It seems of some micronodularity on the top.  Unclear reasons for this.  I am also reassured by the fact that he pulmonary function test June 2023 is normal.  Plan - Do blood work around [employer] gold,, ANA, rheumatoid factor, CCP, double-stranded DNA, SSA, SSB and SCL 70   -Get rid of any  down comforter and down throw that you have  -I will discuss in a case conference  -Do full pulmonary function test in the next 3 months - 4 months  - do HRCT supine and prone, inspiratory and expiratory volume next few weeks to few months   OSA on CPAP  Plan  - Refer Dr. Vassie Loll or Dr. Wynona Neat in our office  Asthma, extrinsic, moderate persistent, uncomplicated Seasonal and perennial allergic rhinitis   Plan  - Refer [place] allergy or [facility] allergy  [facility]  - 3 to 4 months but after completing the above.  No printing who is next can you please

## 2023-03-26 NOTE — Progress Notes (Signed)
OV 03/26/2023 -   Subjective:  Patient ID: Wanda Thornton, female , DOB: Jan 22, 1961 , age 62 y.o. , MRN: [id number] , ADDRESS: [address] [place] [address] PCP [place]il[place], Betty G, MD Patient Care Team: [place], Betty G, MD as PCP - General (Family Medicine)  This Provider for this visit: Treatment Team:  Attending Provider: Kalman Shan, MD    03/26/2023 -   Chief Complaint  Patient presents with   Consult    Consult for Abnormal CT scan of lung.     HPI Wanda Thornton 62 y.o. -this is a transfer of care from Dr. Jetty Duhamel.  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  She states she has a history of allergies for which she established with Dr. Jetty Duhamel.  She also has history of sleep apnea and she is on CPAP and she established for this with Dr. Jetty Duhamel.  In July 2022 she had normal IgE and eosinophils of 400.  In June 2023 she had normal pulmonary function test.  In December 2022 she had normal angiotensin-converting enzyme and also hypersensitive pneumonitis panel.  In 2018 she had RAST allergy panel is positive for almond and Timothy grass.  There is a CT scan in February 2022 hide resolution CT chest that was interpreted by Dr. Trudie Reed.  It is documented below.  He felt there might be some early ILD.  I personally visualized it all I see is micronodularity on the upper lobes but there is no air trapping of this no classical ILD.  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  Patient was recently aware of this result and she requested being seen by an ILD physician.  Therefore she is here.  She does not have any fixed exertional dyspnea.  She is really worried about the CT scan results.  Therefore she is here.  Most recently she feels her allergies have been acting up because of some  dust exposure and also the weather changes.  She just finished a prednisone taper today.  She is partly partially better but she is definitely better.  With a history of seasonal allergies she used to be on allergy shots 15 years ago another in [place] but this would make her wheeze so she stopped doing it.\  She is also expressed desire and changing doctors.  I recommended a sleep physician within our practice.  And also establishing with allergy physician outside of practice.  She is okay with this.  CT Chest data 2022 personally visualized and independently interpreted and my findings are: - Not convinced about ILD arrative & Impression  CLINICAL DATA:  62 year old female with history of chest congestion and productive cough for 1 year. Evaluate for interstitial lung disease.   EXAM: CT CHEST WITHOUT CONTRAST   TECHNIQUE: Multidetector CT imaging of the chest was performed following the standard protocol without intravenous contrast. High resolution imaging of the lungs, as well as inspiratory and expiratory imaging, was performed.   COMPARISON:  No priors.   FINDINGS: Cardiovascular: Heart size is normal. There is no significant pericardial fluid, thickening or pericardial calcification. Aortic atherosclerosis. No definite coronary artery calcifications.   Mediastinum/Nodes: No pathologically enlarged mediastinal or hilar lymph nodes. Please note that accurate exclusion of hilar adenopathy is limited on noncontrast CT scans. Esophagus is unremarkable in  appearance. No axillary lymphadenopathy.   Lungs/Pleura: High-resolution images demonstrate a few areas of mild cylindrical bronchiectasis with some mild thickening of the peribronchovascular interstitium, regional architectural distortion, small amount of peripheral bronchiolectasis, and some peribronchovascular micro nodularity. These findings are most evident in the upper lobes of the lungs bilaterally,  particularly near the lung apices. No other generalized regions of ground-glass attenuation, septal thickening, subpleural reticulation or honeycombing are noted. Lung bases in particular appear essentially spared. Very mild micro nodularity also noted in association with the major fissures of the lungs. No acute consolidative airspace disease. No pleural effusions. No larger more suspicious appearing pulmonary nodules or masses are noted. Linear scarring or subsegmental atelectasis in the left lower lobe.   Upper Abdomen: Unremarkable.   Musculoskeletal: There are no aggressive appearing lytic or blastic lesions noted in the visualized portions of the skeleton.   IMPRESSION: 1. The appearance of the lungs is unusual, with upper lung predominant mild bronchiectasis and peribronchovascular micronodularity. Findings are not favored to represent interstitial lung disease, but may represent sequela of chronic indolent atypical infectious process. Alternatively, these findings could be seen in the setting of a systemic disease such as sarcoidosis. Notably, there is no mediastinal or hilar lymphadenopathy noted on today's examination. 2. Aortic atherosclerosis.   Aortic Atherosclerosis (ICD10-I70.0).     Electronically Signed   By: Trudie Reed M.D.   On: 11/01/2020 08:36       Latest Reference Range & Units 02/13/17 11:00  Sheep Sorrel IgE kU/L <0.10  Pecan/Hickory Tree IgE kU/L <0.10  IgE (Immunoglobulin E), Serum <115 kU/L 66  Allergen, D pternoyssinus,d7 kU/L <0.10  Cat Dander kU/L <0.10  Dog Dander kU/L <0.10  [place] Grass kU/L <0.10  Johnson Grass kU/L <0.10  Timothy Grass kU/L 2.74 (H)  Cockroach kU/L <0.10  Aspergillus fumigatus, m3 kU/L <0.10  Allergen, Comm Silver Charletta Cousin, t9 kU/L <0.10  Allergen, Cottonwood, t14 kU/L <0.10  Elm IgE kU/L 0.14 (H)  Allergen, Mulberry, t76 kU/L <0.10  Allergen, Oak,t7 kU/L <0.10  Common Ragweed kU/L <0.10  Allergen, Mouse  Urine Protein, e78 kU/L <0.10  D. farinae kU/L <0.10  Allergen, Cedar tree, t12 kU/L <0.10  Box Elder IgE kU/L <0.10  Rough Pigweed  IgE kU/L <0.10  (H): Data is abnormally high   Latest Reference Range & Units 02/13/17 11:00 04/24/20 16:31 11/01/21 10:19  IgE (Immunoglobulin E), Serum <OR=114 kU/L 66 73 34    PFT     Latest Ref Rng & Units 03/01/2022    9:04 AM  PFT Results  FVC-Pre L 2.90   FVC-Predicted Pre % 121   FVC-Post L 2.88   FVC-Predicted Post % 121   Pre FEV1/FVC % % 88   Post FEV1/FCV % % 90   FEV1-Pre L 2.56   FEV1-Predicted Pre % 136   FEV1-Post L 2.61   DLCO uncorrected ml/min/mmHg 21.60   DLCO UNC% % 117   DLCO corrected ml/min/mmHg 21.60   DLCO COR %Predicted % 117   DLVA Predicted % 113   TLC L 4.60   TLC % Predicted % 99   RV % Predicted % 82        has a past medical history of Allergy, Arthritis, Asthma, Hypertension, and Sleep apnea.   reports that she has never smoked. She has been exposed to tobacco smoke. She has never used smokeless tobacco.  Past Surgical History:  Procedure Laterality Date   BREAST BIOPSY Right    PARTIAL HYSTERECTOMY  2005  TRIGGER FINGER RELEASE     wisdom teeth      Allergies  Allergen Reactions   Amoxicillin Hives   Bactrim [Sulfamethoxazole-Trimethoprim] Swelling    Anaphylaxis    Clindamycin/Lincomycin Swelling    Anaphylaxis    Hydrocodone     Nausea vomiting   Keflex [Cephalexin] Swelling    Anaphylaxis    Lisinopril     headache    Immunization History  Administered Date(s) Administered   Influenza,inj,Quad PF,6+ Mos 10/16/1998, 06/17/2017, 06/17/2018, 06/29/2019, 07/10/2020, 07/23/2021, 07/19/2022   Moderna Sars-Covid-2 Vaccination 12/16/2019, 01/18/2020, 08/05/2020, 02/22/2021, 07/08/2021   Pfizer Covid-19 Vaccine Bivalent Booster 5y-11y 06/29/2022   Pneumococcal Polysaccharide-23 11/07/2015   Tdap 09/15/2009, 10/08/2019   Zoster Recombinat (Shingrix) 07/19/2022, 09/18/2022    Family  History  Problem Relation Age of Onset   Hyperlipidemia Mother    Hypertension Mother    Hypertension Father    Hyperlipidemia Father    Diabetes Sister    Cancer Sister        breast   Breast cancer Sister    Diabetes Sister    Cancer Sister        breast   Breast cancer Sister    Breast cancer Maternal Aunt    Colon cancer Neg Hx    Colon polyps Neg Hx    Esophageal cancer Neg Hx    Rectal cancer Neg Hx    Stomach cancer Neg Hx      Current Outpatient Medications:    albuterol (VENTOLIN HFA) 108 (90 Base) MCG/ACT inhaler, USE 2 INHALATIONS EVERY 4 TO 6 HOURS AS NEEDED FOR DYSPNEA AND WHEEZING, Disp: 25.5 g, Rfl: 3   amLODipine (NORVASC) 10 MG tablet, TAKE 1 TABLET DAILY, Disp: 90 tablet, Rfl: 3   B Complex-C (B-COMPLEX WITH VITAMIN C) tablet, Take 1 tablet by mouth daily., Disp: , Rfl:    CALCIUM CITRATE PO, Take 2 tablets by mouth 2 (two) times daily., Disp: , Rfl:    cholecalciferol (VITAMIN D) 1000 units tablet, Take 2,000 Units by mouth daily. , Disp: , Rfl:    CRANBERRY PO, Take 1 tablet by mouth daily., Disp: , Rfl:    fexofenadine (ALLEGRA) 180 MG tablet, Take 180 mg by mouth daily., Disp: , Rfl:    fluticasone (FLONASE) 50 MCG/ACT nasal spray, USE 2 SPRAYS IN EACH NOSTRIL DAILY AS NEEDED FOR ALLERGIES, Disp: 48 g, Rfl: 3   fluticasone-salmeterol (ADVAIR) 250-50 MCG/ACT AEPB, Inhale 1 puff then rinse mouth, twice daily, Disp: 180 each, Rfl: 4   KLOR-CON M20 20 MEQ tablet, TAKE 1 TABLET TWICE A DAY, Disp: 180 tablet, Rfl: 3   montelukast (SINGULAIR) 10 MG tablet, TAKE 1 TABLET AT BEDTIME, Disp: 90 tablet, Rfl: 3   predniSONE (DELTASONE) 10 MG tablet, Take 1 tablet (10 mg total) by mouth daily with breakfast. 2 daily x 3 days, then one daily, Disp: 10 tablet, Rfl: 0   diphenhydrAMINE (BENADRYL) 25 MG tablet, Take 1 tablet (25 mg total) by mouth every 6 (six) hours for 1 day., Disp: 4 tablet, Rfl: 0      Objective:   Vitals:   03/26/23 1353  BP: (!) 140/90  Pulse:  (!) 118  SpO2: 97%  Weight: 175 lb 3.2 oz (79.5 kg)  Height: 5\' 2"  (1.575 m)    Estimated body mass index is 32.04 kg/m as calculated from the following:   Height as of this encounter: 5\' 2"  (1.575 m).   Weight as of this encounter: 175 lb 3.2 oz (79.5 kg).  @  Marinus Maw  Filed Weights   03/26/23 1353  Weight: 175 lb 3.2 oz (79.5 kg)     Physical Exam   General: No distress. Looks well O2 at rest: no Cane present: no Sitting in wheel chair: no Frail: no Obese: yes Neuro: Alert and Oriented x 3. GCS 15. Speech normal Psych: Pleasant Resp:  Barrel Chest - no.  Wheeze - no, Crackles - no, No overt respiratory distress CVS: Normal heart sounds. Murmurs - no Ext: Stigmata of Connective Tissue Disease - no HEENT: Normal upper airway. PEERL +. No post nasal drip        Assessment:       ICD-10-CM   1. Abnormal CT scan of lung  R91.8 Pulmonary function test    QuantiFERON-TB Gold Plus    Rheumatoid Factor    Cyclic citrul peptide antibody, IgG    Anti-DNA antibody, double-stranded    ANA+ENA+DNA/DS+Scl 70+SjoSSA/B    Anti-DNA antibody, double-stranded    Cyclic citrul peptide antibody, IgG    Rheumatoid Factor    2. Multiple lung nodules on CT  R91.8 CT Chest High Resolution    3. OSA on CPAP  G47.33     4. Asthma, extrinsic, moderate persistent, uncomplicated  J45.40     5. Seasonal and perennial allergic rhinitis  J30.89    J30.2          Plan:     Patient Instructions  Abnormal CT scan of lung Multiple lung nodules on CT  -The CT scan (2022) does not look like classic pulmonary fibrosis for me.  It seems of some micronodularity on the top.  Unclear reasons for this.  I am also reassured by the fact that he pulmonary function test June 2023 is normal.  Plan - Do blood work around [employer] gold,, ANA, rheumatoid factor, CCP, double-stranded DNA, SSA, SSB and SCL 70   -Get rid of any  down comforter and down throw that you have  -I will  discuss in a case conference  -Do full pulmonary function test in the next 3 months - 4 months  - do HRCT supine and prone, inspiratory and expiratory volume next few weeks to few months   OSA on CPAP  Plan  - Refer Dr. Vassie Loll or Dr. Wynona Neat in our office  Asthma, extrinsic, moderate persistent, uncomplicated Seasonal and perennial allergic rhinitis   Plan  - Refer [place] allergy or [facility] allergy  Follouwp  - 3 to 4 months but after completing the above.  No printing who is next can you please  ( Level 05 visit E&M 2024: Estb >= 40 min  in  visit type: on-site physical face to visit  in total care time and counseling or/and coordination of care by this undersigned MD - Dr Kalman Shan. This includes one or more of the following on this same day 03/26/2023: pre-charting, chart review, note writing, documentation discussion of test results, diagnostic or treatment recommendations, prognosis, risks and benefits of management options, instructions, education, compliance or risk-factor reduction. It excludes time spent by the CMA or office staff in the care of the patient. Actual time 45 min)   SIGNATURE    Dr. Kalman Shan, M.D., F.C.C.P,  Pulmonary and Critical Care Medicine Staff Physician, [facility] Center Director - Interstitial Lung Disease  Program  Pulmonary Fibrosis [facility] at [facility] [place], [place], [address]  Pager: [phone], If no answer or between  15:00h - 7:00h: call [phone] Telephone: [phone]  3:07 PM 03/26/2023

## 2023-03-28 LAB — QUANTIFERON-TB GOLD PLUS
Mitogen-NIL: 5.63 IU/mL
NIL: 0.03 IU/mL
QuantiFERON-TB Gold Plus: NEGATIVE
TB1-NIL: 0.05 IU/mL
TB2-NIL: 0.03 IU/mL

## 2023-03-28 LAB — RHEUMATOID FACTOR: Rheumatoid fact SerPl-aCnc: <10 IU/mL (ref <14)

## 2023-03-28 LAB — ANTI-DNA ANTIBODY, DOUBLE-STRANDED: ds DNA Ab: <1 IU/mL

## 2023-03-28 LAB — CYCLIC CITRUL PEPTIDE ANTIBODY, IGG: Cyclic Citrullin Peptide Ab: <16 UNITS

## 2023-03-31 LAB — ANA+ENA+DNA/DS+SCL 70+SJOSSA/B
ANA Titer 1: NEGATIVE
ENA RNP Ab: <0.2 AI (ref 0.0–0.9)
ENA SM Ab Ser-aCnc: <0.2 AI (ref 0.0–0.9)
ENA SSA (RO) Ab: <0.2 AI (ref 0.0–0.9)
ENA SSB (LA) Ab: <0.2 AI (ref 0.0–0.9)
Scleroderma (Scl-70) (ENA) Antibody, IgG: <0.2 AI (ref 0.0–0.9)
dsDNA Ab: <1 IU/mL (ref 0–9)

## 2023-04-04 ENCOUNTER — Ambulatory Visit
Admission: RE | Admit: 2023-04-04 | Discharge: 2023-04-04 | Disposition: A | Discharge status: Home / Self Care | Payer: 59 | Source: Ambulatory Visit | Attending: Internal Medicine | Admitting: Internal Medicine

## 2023-04-04 DIAGNOSIS — R918 Other nonspecific abnormal finding of lung field: Secondary | ICD-10-CM

## 2023-04-08 ENCOUNTER — Ambulatory Visit: Payer: Self-pay | Admitting: Internal Medicine

## 2023-04-08 NOTE — Progress Notes (Cosign Needed)
Interstitial Lung Disease Multidisciplinary Conference   Wanda Thornton    MRN [id number]    DOB June 15, 1961  Primary Care Physician:Jordan, Timoteo Expose, MD  Referring Physician: Dr. Marchelle Gearing  Time of Conference: 7.00am- 8.00am Date of conference: 04/08/2023 Location of Conference: -  Virtual  Participating Pulmonary: Dr. Kalman Shan Pathology: - Radiology: Dr Allegra Lai  Others: -  Brief History: Patient of Dr. Maple Hudson for allergies and asthma. Had abnormal CT read as ILD. PFT is normal /hyperinflated. I do not see ILD. I see some micronodules apically. She has no fixed Dyspnea on exertion. She is exposed to down comforter.   PFT    Latest Ref Rng & Units 03/01/2022    9:04 AM  PFT Results  FVC-Pre L 2.90   FVC-Predicted Pre % 121   FVC-Post L 2.88   FVC-Predicted Post % 121   Pre FEV1/FVC % % 88   Post FEV1/FCV % % 90   FEV1-Pre L 2.56   FEV1-Predicted Pre % 136   FEV1-Post L 2.61   DLCO uncorrected ml/min/mmHg 21.60   DLCO UNC% % 117   DLCO corrected ml/min/mmHg 21.60   DLCO COR %Predicted % 117   DLVA Predicted % 113   TLC L 4.60   TLC % Predicted % 99   RV % Predicted % 82       MDD discussion of CT scan    - Date or time period of scan:  HRCT: 04/04/2023 HRCT: 10/31/2020    - Discussion synopsis:  No evidence of ILD per Dr Dorothey Baseman. But there are findings in upper lobes which are very subtle. Very subtle ground glass , reticular findings that are peribrronchosvascular. Not nodular. This could be post infectious scarring and other possible UL diseases such as sarcoid, or HP. Though there is NO air trapping  - What is the final conclusion per 2018 ATS/Fleischner Criteria - overall likely post inflammatory scarring in UL.  - Concordance with official report: discordant  Pathology discussion of biopsy: n/a    MDD Impression/Recs:  No ILD. Plan a) get rid of down comforter; b) monitor with PFT/ CT   Time Spent in preparation and  discussion:  > 30 min    SIGNATURE   Dr. Kalman Shan, M.D., F.C.C.P,  Pulmonary and Critical Care Medicine Staff Physician, [facility] Center Director - Interstitial Lung Disease  Program  Pulmonary Fibrosis [facility] at [facility] [place], [place], [address]  Pager: [phone], If no answer or between  15:00h - 7:00h: call [phone] Telephone: [phone]  9:29 PM 04/08/2023 ...................................................................................................................Marland Kitchen References: Diagnosis of Hypersensitivity Pneumonitis in Adults. An Official ATS/JRS/ALAT Clinical Practice Guideline. Ragu G et al, Am J Respir Crit Care Med. 2020 Aug 1;202(3):e36-e69.       Diagnosis of Idiopathic Pulmonary Fibrosis. An Official ATS/ERS/JRS/ALAT Clinical Practice Guideline. Raghu G et al, Am J Respir Crit Care Med. 2018 Sep 1;198(5):e44-e68.   IPF Suspected   Histopath ology Pattern      UIP  Probable UIP  Indeterminate for  UIP  Alternative  diagnosis    UIP  IPF  IPF  IPF  Non-IPF dx   HRCT   Probabe UIP  IPF  IPF  IPF (Likely)**  Non-IPF dx  Pattern  Indeterminate for UIP  IPF  IPF (Likely)**  Indeterminate  for IPF**  Non-IPF dx    Alternative diagnosis  IPF (Likely)**/ non-IPF dx  Non-IPF dx  Non-IPF dx  Non-IPF dx     Idiopathic pulmonary fibrosis diagnosis based upon HRCT and Biopsy paterns.  ** IPF is the likely diagnosis when any of following features are present:  Moderate-to-severe traction bronchiectasis/bronchiolectasis (defined as mild traction bronchiectasis/bronchiolectasis in four or more lobes including the lingual as a lobe, or moderate to severe traction bronchiectasis in two or more lobes) in a man over age 73 years or in a woman over age 12 years Extensive (>30%) reticulation on HRCT and an age >70 years  Increased neutrophils and/or absence of lymphocytosis in BAL fluid   Multidisciplinary discussion reaches a confident diagnosis of IPF.   **Indeterminate for IPF  Without an adequate biopsy is unlikely to be IPF  With an adequate biopsy may be reclassified to a more specific diagnosis after multidisciplinary discussion and/or additional consultation.   dx = diagnosis; HRCT = high-resolution computed tomography; IPF = idiopathic pulmonary fibrosis; UIP = usual interstitial pneumonia.

## 2023-04-11 NOTE — Progress Notes (Signed)
Stable lookin lung x 2.5 years. Very sbutle fidings. Not fitting into pattern of any diseae. Best is to watch. See you in sept 2024 after breathing test.

## 2023-05-03 ENCOUNTER — Other Ambulatory Visit: Payer: Self-pay | Admitting: Family Medicine

## 2023-05-03 DIAGNOSIS — I10 Essential (primary) hypertension: Secondary | ICD-10-CM

## 2023-05-03 DIAGNOSIS — E876 Hypokalemia: Secondary | ICD-10-CM

## 2023-06-03 ENCOUNTER — Ambulatory Visit (INDEPENDENT_AMBULATORY_CARE_PROVIDER_SITE_OTHER): Payer: 59 | Admitting: Pulmonary Disease

## 2023-06-03 ENCOUNTER — Encounter: Payer: Self-pay | Admitting: Pulmonary Disease

## 2023-06-03 VITALS — BP 130/80 | HR 99 | Ht 62.0 in | Wt 178.2 lb

## 2023-06-03 DIAGNOSIS — G4733 Obstructive sleep apnea (adult) (pediatric): Secondary | ICD-10-CM

## 2023-06-03 NOTE — Progress Notes (Signed)
Wanda Thornton    [id number]    20-Jun-1961  Primary Care Physician:Jordan, Timoteo Expose, MD  Referring Physician: [place], Betty G, MD [address] [place],  [place] [address]  Chief complaint:   Patient being seen for obstructive sleep apnea  HPI:  Diagnosed with obstructive sleep apnea many years ago Moved here from [place]  Having some issues with CPAP machine She did have an older machine that she was able to transition back to using, machine made a humming noise but she feels this is better tolerated than a new machine  She feels current machine is about 60 years old but she was waking up feeling congested Did take the machine to the DME company and they could not find anything wrong with the machine, they stated they run it overnight and could not find any issues with it  She did try to use it after it was evaluated but could not tolerated  Follows up with Dr. Sandi Carne for abnormal CT scan of the chest -Repeat CTs have been stable -Will continue to follow with Dr. Marchelle Gearing  She does have multiple allergies  Outpatient Encounter Medications as of 06/03/2023  Medication Sig   albuterol (VENTOLIN HFA) 108 (90 Base) MCG/ACT inhaler USE 2 INHALATIONS EVERY 4 TO 6 HOURS AS NEEDED FOR DYSPNEA AND WHEEZING   amLODipine (NORVASC) 10 MG tablet TAKE 1 TABLET DAILY   B Complex-C (B-COMPLEX WITH VITAMIN C) tablet Take 1 tablet by mouth daily.   CALCIUM CITRATE PO Take 2 tablets by mouth 2 (two) times daily.   cholecalciferol (VITAMIN D) 1000 units tablet Take 2,000 Units by mouth daily.    CRANBERRY PO Take 1 tablet by mouth daily.   fexofenadine (ALLEGRA) 180 MG tablet Take 180 mg by mouth daily.   fluticasone (FLONASE) 50 MCG/ACT nasal spray USE 2 SPRAYS IN EACH NOSTRIL DAILY AS NEEDED FOR ALLERGIES   fluticasone-salmeterol (ADVAIR) 250-50 MCG/ACT AEPB Inhale 1 puff then rinse mouth, twice daily   KLOR-CON M20 20 MEQ tablet TAKE 1 TABLET TWICE A DAY    montelukast (SINGULAIR) 10 MG tablet TAKE 1 TABLET AT BEDTIME   [DISCONTINUED] diphenhydrAMINE (BENADRYL) 25 MG tablet Take 1 tablet (25 mg total) by mouth every 6 (six) hours for 1 day.   [DISCONTINUED] predniSONE (DELTASONE) 10 MG tablet Take 1 tablet (10 mg total) by mouth daily with breakfast. 2 daily x 3 days, then one daily   No facility-administered encounter medications on file as of 06/03/2023.    Allergies as of 06/03/2023 - Review Complete 06/03/2023  Allergen Reaction Noted   Amoxicillin Hives 04/19/2018   Bactrim [sulfamethoxazole-trimethoprim] Swelling 04/19/2018   Clindamycin/lincomycin Swelling 04/19/2018   Hydrocodone  04/19/2018   Keflex [cephalexin] Swelling 04/19/2018   Lisinopril  04/19/2018    Past Medical History:  Diagnosis Date   Allergy    Arthritis    fingers   Asthma    Hypertension    Sleep apnea    cpap    Past Surgical History:  Procedure Laterality Date   BREAST BIOPSY Right    PARTIAL HYSTERECTOMY  2005   TRIGGER FINGER RELEASE     wisdom teeth      Family History  Problem Relation Age of Onset   Hyperlipidemia Mother    Hypertension Mother    Hypertension Father    Hyperlipidemia Father    Diabetes Sister    Cancer Sister        breast  Breast cancer Sister    Diabetes Sister    Cancer Sister        breast   Breast cancer Sister    Breast cancer Maternal Aunt    Colon cancer Neg Hx    Colon polyps Neg Hx    Esophageal cancer Neg Hx    Rectal cancer Neg Hx    Stomach cancer Neg Hx     Social History   Socioeconomic History   Marital status: Married    Spouse name: Not on file   Number of children: Not on file   Years of education: Not on file   Highest education level: Not on file  Occupational History   Not on file  Tobacco Use   Smoking status: Never    Passive exposure: Yes   Smokeless tobacco: Never  Vaping Use   Vaping status: Never Used  Substance and Sexual Activity   Alcohol use: No   Drug use: No    Sexual activity: Yes  Other Topics Concern   Not on file  Social History Narrative   Not on file   Social Determinants of Health   Financial Resource Strain: Not on file  Food Insecurity: Not on file  Transportation Needs: Not on file  Physical Activity: Not on file  Stress: Not on file  Social Connections: Not on file  Intimate Partner Violence: Not on file    Review of Systems  Constitutional:  Negative for fatigue.  Respiratory:  Positive for apnea.   Psychiatric/Behavioral:  Positive for sleep disturbance.     Vitals:   06/03/23 0913  BP: 130/80  Pulse: 99  SpO2: 99%     Physical Exam Constitutional:      Appearance: Normal appearance.  HENT:     Head: Normocephalic.     Nose: Nose normal.     Mouth/Throat:     Mouth: Mucous membranes are moist.  Eyes:     General: No scleral icterus. Cardiovascular:     Rate and Rhythm: Normal rate and regular rhythm.     Heart sounds: No murmur heard.    No friction rub.  Pulmonary:     Effort: No respiratory distress.     Breath sounds: No stridor. No wheezing or rhonchi.  Musculoskeletal:     Cervical back: No rigidity or tenderness.  Neurological:     Mental Status: She is alert.  Psychiatric:        Mood and Affect: Mood normal.      Data Reviewed: CPAP compliance reviewed showing excellent compliance with CPAP Residual AHI of 0.9 AutoSet 5-15  Assessment:  Obstructive sleep apnea -Compliant with CPAP therapy  No issues tolerating current CPAP  Encouraged to have machine reevaluated to see if anything is wrong with it  I did reassure her that her current machine seems to be working well and she stands no risks with continuing to use it  Plan/Recommendations: Continue using CPAP nightly  Follow-up a year from now  Encouraged to call with significant concerns  Weight loss efforts encouraged     Virl Diamond MD [facility] 06/03/2023, 9:29 AM  CC: [place], Betty G,  MD

## 2023-06-03 NOTE — Patient Instructions (Signed)
Continue using your CPAP nightly  Check into how old your new machine is, if it is more than 60 years old, you should be eligible for new machine. If it is not up to 5 years, then a DME company has to check it to make sure it is not working well before [employer] will cover it  I will reassure you that from the download from your machine, your CPAP is working well, controlling the events optimally  No risk with the machine that you are using right now  I will see you a year from now  You are welcome to call with any significant concerns

## 2023-06-21 ENCOUNTER — Other Ambulatory Visit: Payer: Self-pay | Admitting: Family Medicine

## 2023-06-21 DIAGNOSIS — J4540 Moderate persistent asthma, uncomplicated: Secondary | ICD-10-CM

## 2023-06-23 ENCOUNTER — Other Ambulatory Visit: Payer: Self-pay | Admitting: Family Medicine

## 2023-06-23 DIAGNOSIS — Z1231 Encounter for screening mammogram for malignant neoplasm of breast: Secondary | ICD-10-CM

## 2023-06-30 ENCOUNTER — Encounter: Payer: Self-pay | Admitting: Internal Medicine

## 2023-06-30 ENCOUNTER — Ambulatory Visit (INDEPENDENT_AMBULATORY_CARE_PROVIDER_SITE_OTHER): Payer: 59 | Admitting: Internal Medicine

## 2023-06-30 VITALS — BP 124/72 | HR 76 | Resp 16 | Ht 62.0 in | Wt 176.0 lb

## 2023-06-30 DIAGNOSIS — J3089 Other allergic rhinitis: Secondary | ICD-10-CM

## 2023-06-30 DIAGNOSIS — Z23 Encounter for immunization: Secondary | ICD-10-CM

## 2023-06-30 DIAGNOSIS — G4733 Obstructive sleep apnea (adult) (pediatric): Secondary | ICD-10-CM

## 2023-06-30 DIAGNOSIS — R918 Other nonspecific abnormal finding of lung field: Secondary | ICD-10-CM

## 2023-06-30 DIAGNOSIS — J302 Other seasonal allergic rhinitis: Secondary | ICD-10-CM

## 2023-06-30 LAB — PULMONARY FUNCTION TEST
DL/VA % pred: 100 %
DL/VA: 4.33 ml/min/mmHg/L
DLCO cor % pred: 103 %
DLCO cor: 18.99 ml/min/mmHg
DLCO unc % pred: 103 %
DLCO unc: 18.99 ml/min/mmHg
FEF 25-75 Pre: 4.26 L/s
FEF2575-%Pred-Pre: 191 %
FEV1-%Pred-Pre: 116 %
FEV1-Pre: 2.69 L
FEV1FVC-%Pred-Pre: 114 %
FEV6-%Pred-Pre: 104 %
FEV6-Pre: 3.01 L
FEV6FVC-%Pred-Pre: 103 %
FVC-%Pred-Pre: 101 %
FVC-Pre: 3.01 L
Pre FEV1/FVC ratio: 89 %
Pre FEV6/FVC Ratio: 100 %

## 2023-06-30 NOTE — Progress Notes (Signed)
Spirometry/DLCO performed today. 

## 2023-06-30 NOTE — Patient Instructions (Addendum)
Abnormal CT scan of lung Multiple lung nodules on CT  -The CT scan (2022) and 2024  does not look like classic pulmonary fibrosis for me.  It seems of some micronodularity on the top.  It is stable.  Which is very reassuring.  Especially because the pulmonary function test is normal.  Autoimmune and blood TB test are negative/normal.  Glad you got rid of the down comforter at home.  Unclear cause but could be burned-out sarcoid  Plan -expectant approach with serial monitoring is the best approach. - Do full pulmonary function test in 12 months     Asthma, extrinsic, moderate persistent, uncomplicated Seasonal and perennial allergic rhinitis  -Mild intermittent and ongoing  Plan  -RE- Refer [place] allergy or [facility] allergy  Follouwp  - 12  months but after completing PFT

## 2023-06-30 NOTE — Progress Notes (Signed)
OV 03/26/2023 -   Subjective:  Patient ID: Wanda Thornton, female , DOB: [birth year] , age 62 y.o. , MRN: [id number] , ADDRESS: [address] [place] [address] PCP [place], Betty G, MD Patient Care Team: [place], Betty G, MD as PCP - General (Family Medicine)  This Provider for this visit: Treatment Team:  Attending Provider: Kalman Shan, MD    03/26/2023 -   Chief Complaint  Patient presents with   Consult    Consult for Abnormal CT scan of lung.     HPI Wanda Thornton 62 y.o. -this is a transfer of care from Dr. Jetty Duhamel.  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  She states she has a history of allergies for which she established with Dr. Jetty Duhamel.  She also has history of sleep apnea and she is on CPAP and she established for this with Dr. Jetty Duhamel.  In July 2022 she had normal IgE and eosinophils of 400.  In June 2023 she had normal pulmonary function test.  In December 2022 she had normal angiotensin-converting enzyme and also hypersensitive pneumonitis panel.  In 2018 she had [facility] allergy panel is positive for almond and Timothy grass.  There is a CT scan in February 2022 hide resolution CT chest that was interpreted by Dr. Trudie Reed.  It is documented below.  He felt there might be some early ILD.  I personally visualized it all I see is micronodularity on the upper lobes but there is no air trapping of this no classical ILD.  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  Patient was recently aware of this result and she requested being seen by an ILD physician.  Therefore she is here.  She does not have any fixed exertional dyspnea.  She is really worried about the CT scan results.  Therefore she is here.  Most recently she feels her allergies have been acting up because of some  dust exposure and also the weather changes.  She just finished a prednisone taper today.  She is partly partially better but she is definitely better.  With a history of seasonal allergies she used to be on allergy shots 15 years ago another in [place] but this would make her wheeze so she stopped doing it.\  She is also expressed desire and changing doctors.  I recommended a sleep physician within our practice.  And also establishing with allergy physician outside of practice.  She is okay with this.  CT Chest data 2022 personally visualized and independently interpreted and my findings are: - Not convinced about ILD arrative & Impression  CLINICAL DATA:  62 year old female with history of chest congestion and productive cough for 1 year. Evaluate for interstitial lung disease.   EXAM: CT CHEST WITHOUT CONTRAST   TECHNIQUE: Multidetector CT imaging of the chest was performed following the standard protocol without intravenous contrast. High resolution imaging of the lungs, as well as inspiratory and expiratory imaging, was performed.   COMPARISON:  No priors.   FINDINGS: Cardiovascular: Heart size is normal. There is no significant pericardial fluid, thickening or pericardial calcification. Aortic atherosclerosis. No definite coronary artery calcifications.   Mediastinum/Nodes: No pathologically enlarged mediastinal or hilar lymph nodes. Please note that accurate exclusion of hilar adenopathy is limited on noncontrast CT scans. Esophagus is unremarkable in  appearance. No axillary lymphadenopathy.   Lungs/Pleura: High-resolution images demonstrate a few areas of mild cylindrical bronchiectasis with some mild thickening of the peribronchovascular interstitium, regional architectural distortion, small amount of peripheral bronchiolectasis, and some peribronchovascular micro nodularity. These findings are most evident in the upper lobes of the lungs bilaterally,  particularly near the lung apices. No other generalized regions of ground-glass attenuation, septal thickening, subpleural reticulation or honeycombing are noted. Lung bases in particular appear essentially spared. Very mild micro nodularity also noted in association with the major fissures of the lungs. No acute consolidative airspace disease. No pleural effusions. No larger more suspicious appearing pulmonary nodules or masses are noted. Linear scarring or subsegmental atelectasis in the left lower lobe.   Upper Abdomen: Unremarkable.   Musculoskeletal: There are no aggressive appearing lytic or blastic lesions noted in the visualized portions of the skeleton.   IMPRESSION: 1. The appearance of the lungs is unusual, with upper lung predominant mild bronchiectasis and peribronchovascular micronodularity. Findings are not favored to represent interstitial lung disease, but may represent sequela of chronic indolent atypical infectious process. Alternatively, these findings could be seen in the setting of a systemic disease such as sarcoidosis. Notably, there is no mediastinal or hilar lymphadenopathy noted on today's examination. 2. Aortic atherosclerosis.   Aortic Atherosclerosis (ICD10-I70.0).     Electronically Signed   By: Trudie Reed M.D.   On: 11/01/2020 08:36      OV 06/30/2023  Subjective:  Patient ID: Wanda Thornton, female , DOB: 13-Jun-1961 , age 70 y.o. , MRN: [id number] , ADDRESS: [address] [place] [address] PCP [place], Betty G, MD Patient Care Team: [place], Betty G, MD as PCP - General (Family Medicine)  This Provider for this visit: Treatment Team:  Attending Provider: Kalman Shan, MD    06/30/2023 -   Chief Complaint  Patient presents with   Follow-up    PFT Results     HPI Wanda Thornton 62 y.o.   #Follow-up upper lobe micronodularity.: -discussed in April 08, 2023 for interstitial lung disease conference.  She  does not have ILD.  She likely has postinflammatory consequences in the upper lobe.  Serial monitoring with conservative management is recommended and advised to get rid of her down comforter.  She tells me today that she did get rid of the got down comforter.  She says no interim health issues other than this morning her husband cleaning the bathroom with some chemicals in her nose stuffed up.  Her blood QuantiFERON gold and autoimmune panels all negative.  She had repeat pulmonary function test normal.  She had repeat high-resolution CT chest in July 2024.  Personally visualized and compared to 2022 no change and I agree with the thoracic radiologist on this.  She had pulmonary function test and it is normal   #Possible sleep apnea/like ongoing sleep apnea: She saw Dr Val Eagle 06/23/23 per chart review. He has asked her to continue cPAP  #Mild allergies seasonal: Last admitted referral to the allergist but she has not seen 1.  She is willing to get a referral.  #Social - Brother died in [place].  She is up-to-date with her COVID-vaccine.  She had a flu shot today.  She will have RSV shot commercially.  Educated t her about this.   CT Chest data from date: HRCT 04/04/23  - personally visualized and independently interpreted : yes - my findings are: per official  IMPRESSION: 1. The appearance of the lungs is stable compared to  the prior study, once again favored to reflect an alternative diagnosis (not usual interstitial pneumonia) per current ATS guidelines. Findings are once again favored to reflect either chronic atypical infection, or a systemic disease such as sarcoidosis. 2. Aortic atherosclerosis.   Aortic Atherosclerosis (ICD10-I70.0).     Electronically Signed   By: Trudie Reed M.D.   On: 04/07/2023 11:51  PFT    Latest Ref Rng & Units 06/30/2023   10:02 AM 03/01/2022    9:04 AM  PFT Results  FVC-Pre L 3.01  2.90   FVC-Predicted Pre % 101  121   FVC-Post L  2.88    FVC-Predicted Post %  121   Pre FEV1/FVC % % 89  88   Post FEV1/FCV % %  90   FEV1-Pre L 2.69  2.56   FEV1-Predicted Pre % 116  136   FEV1-Post L  2.61   DLCO uncorrected ml/min/mmHg 18.99  21.60   DLCO UNC% % 103  117   DLCO corrected ml/min/mmHg 18.99  21.60   DLCO COR %Predicted % 103  117   DLVA Predicted % 100  113   TLC L  4.60   TLC % Predicted %  99   RV % Predicted %  82        LAB RESULTS last 96 hours No results found.  LAB RESULTS last 90 days Recent Results (from the past 2160 hour(s))  Pulmonary function test     Status: None   Collection Time: 06/30/23 10:02 AM  Result Value Ref Range   FVC-Pre 3.01 L   FVC-%Pred-Pre 101 %   FEV1-Pre 2.69 L   FEV1-%Pred-Pre 116 %   FEV6-Pre 3.01 L   FEV6-%Pred-Pre 104 %   Pre FEV1/FVC ratio 89 %   FEV1FVC-%Pred-Pre 114 %   Pre FEV6/FVC Ratio 100 %   FEV6FVC-%Pred-Pre 103 %   FEF 25-75 Pre 4.26 L/sec   FEF2575-%Pred-Pre 191 %   DLCO unc 18.99 ml/min/mmHg   DLCO unc % pred 103 %   DLCO cor 18.99 ml/min/mmHg   DLCO cor % pred 103 %   DL/VA 6.57 ml/min/mmHg/L   DL/VA % pred 846 %      Latest Reference Range & Units 03/26/23 14:57  ANA Titer 1  Negative  Cyclic Citrullin Peptide Ab UNITS <16  ds DNA Ab IU/mL <1  dsDNA Ab 0 - 9 IU/mL <1  ENA RNP Ab 0.0 - 0.9 AI <0.2  ENA SSA (RO) Ab 0.0 - 0.9 AI <0.2  ENA SSB (LA) Ab 0.0 - 0.9 AI <0.2  RA Latex Turbid. <14 IU/mL <10  ENA SM Ab Ser-aCnc 0.0 - 0.9 AI <0.2  Scleroderma (Scl-70) (ENA) Antibody, IgG 0.0 - 0.9 AI <0.2   uantiFERON-TB Gold Plus NEGATIVE NEGATIVE  Comment: Negative test result. M. tuberculosis complex    03/26/23 14:57  QUANTIFERON-TB GOLD PLUS Rpt  Rpt: View report in Results Review for more information   has a past medical history of Allergy, Arthritis, Asthma, Hypertension, and Sleep apnea.   reports that she has never smoked. She has been exposed to tobacco smoke. She has never used smokeless tobacco.  Past Surgical History:  Procedure  Laterality Date   BREAST BIOPSY Right    PARTIAL HYSTERECTOMY  2005   TRIGGER FINGER RELEASE     wisdom teeth      Allergies  Allergen Reactions   Amoxicillin Hives   Bactrim [Sulfamethoxazole-Trimethoprim] Swelling    Anaphylaxis    Clindamycin/Lincomycin Swelling  Anaphylaxis    Hydrocodone     Nausea vomiting   Keflex [Cephalexin] Swelling    Anaphylaxis    Lisinopril     headache    Immunization History  Administered Date(s) Administered   Influenza, Seasonal, Injecte, Preservative Fre 06/30/2023   Influenza,inj,Quad PF,6+ Mos 10/16/1998, 06/17/2017, 06/17/2018, 06/29/2019, 07/10/2020, 07/23/2021, 07/19/2022   Moderna Sars-Covid-2 Vaccination 12/16/2019, 01/18/2020, 08/05/2020, 02/22/2021, 07/08/2021   Pfizer Covid-19 Vaccine Bivalent Booster 5y-11y 06/29/2022   Pneumococcal Polysaccharide-23 11/07/2015   Tdap 09/15/2009, 10/08/2019   Zoster Recombinant(Shingrix) 07/19/2022, 09/18/2022    Family History  Problem Relation Age of Onset   Hyperlipidemia Mother    Hypertension Mother    Hypertension Father    Hyperlipidemia Father    Diabetes Sister    Cancer Sister        breast   Breast cancer Sister    Diabetes Sister    Cancer Sister        breast   Breast cancer Sister    Breast cancer Maternal Aunt    Colon cancer Neg Hx    Colon polyps Neg Hx    Esophageal cancer Neg Hx    Rectal cancer Neg Hx    Stomach cancer Neg Hx      Current Outpatient Medications:    albuterol (VENTOLIN HFA) 108 (90 Base) MCG/ACT inhaler, USE 2 INHALATIONS EVERY 4 TO 6 HOURS AS NEEDED FOR DYSPNEA AND WHEEZING, Disp: 25.5 g, Rfl: 3   amLODipine (NORVASC) 10 MG tablet, TAKE 1 TABLET DAILY, Disp: 90 tablet, Rfl: 3   B Complex-C (B-COMPLEX WITH VITAMIN C) tablet, Take 1 tablet by mouth daily., Disp: , Rfl:    CALCIUM CITRATE PO, Take 2 tablets by mouth 2 (two) times daily., Disp: , Rfl:    cholecalciferol (VITAMIN D) 1000 units tablet, Take 2,000 Units by mouth daily. , Disp: ,  Rfl:    CRANBERRY PO, Take 1 tablet by mouth daily., Disp: , Rfl:    fexofenadine (ALLEGRA) 180 MG tablet, Take 180 mg by mouth daily., Disp: , Rfl:    fluticasone (FLONASE) 50 MCG/ACT nasal spray, USE 2 SPRAYS IN EACH NOSTRIL DAILY AS NEEDED FOR ALLERGIES, Disp: 48 g, Rfl: 3   fluticasone-salmeterol (ADVAIR) 250-50 MCG/ACT AEPB, Inhale 1 puff then rinse mouth, twice daily, Disp: 180 each, Rfl: 4   KLOR-CON M20 20 MEQ tablet, TAKE 1 TABLET TWICE A DAY, Disp: 180 tablet, Rfl: 3   montelukast (SINGULAIR) 10 MG tablet, TAKE 1 TABLET AT BEDTIME, Disp: 90 tablet, Rfl: 3      Objective:   Vitals:   06/30/23 1028  BP: 124/72  Pulse: 76  Resp: 16  SpO2: 97%  Weight: 176 lb (79.8 kg)  Height: 5\' 2"  (1.575 m)    Estimated body mass index is 32.19 kg/m as calculated from the following:   Height as of this encounter: 5\' 2"  (1.575 m).   Weight as of this encounter: 176 lb (79.8 kg).  @WEIGHTCHANGE @  [employer]   06/30/23 1028  Weight: 176 lb (79.8 kg)     Physical Exam   General: No distress. Looks well O2 at rest: no Cane present: no Sitting in wheel chair: no Frail: no Obese: no Neuro: Alert and Oriented x 3. GCS 15. Speech normal Psych: Pleasant Resp:  Barrel Chest - no.  Wheeze - no, Crackles - no, No overt respiratory distress CVS: Normal heart sounds. Murmurs - no Ext: Stigmata of Connective Tissue Disease - no HEENT: Normal upper airway. PEERL +.  No post nasal drip        Assessment:       ICD-10-CM   1. Abnormal CT scan of lung  R91.8     2. Multiple lung nodules on CT  R91.8     3. Seasonal and perennial allergic rhinitis  J30.89    J30.2     4. OSA on CPAP  G47.33     5. Need for vaccination  Z23 Flu vaccine trivalent PF, 6mos and older(Flulaval,Afluria,Fluarix,Fluzone)         Plan:     Patient Instructions  Abnormal CT scan of lung Multiple lung nodules on CT  -The CT scan (2022) and 2024  does not look like classic pulmonary fibrosis for  me.  It seems of some micronodularity on the top.  It is stable.  Which is very reassuring.  Especially because the pulmonary function test is normal.  Autoimmune and blood TB test are negative/normal.  Glad you got rid of the down comforter at home.  Unclear cause but could be burned-out sarcoid  Plan -expectant approach with serial monitoring is the best approach. - Do full pulmonary function test in 12 months     Asthma, extrinsic, moderate persistent, uncomplicated Seasonal and perennial allergic rhinitis  -Mild intermittent and ongoing  Plan  -RE- Refer [place] allergy or [facility] allergy  Follouwp  - 12  months but after completing PFT   FOLLOWUP Return in about 1 year (around 06/29/2024) for 15 min visit, with Dr Marchelle Gearing, Face to Face Visit.    SIGNATURE    Dr. Kalman Shan, M.D., F.C.C.P,  Pulmonary and Critical Care Medicine Staff Physician, [facility] Center Director - Interstitial Lung Disease  Program  Pulmonary Fibrosis [facility] at [facility] [place], [place], [address]  Pager: [phone], If no answer or between  15:00h - 7:00h: call [phone] Telephone: [phone]  5:07 PM 06/30/2023

## 2023-06-30 NOTE — Patient Instructions (Signed)
Spirometry/DLCO performed today. 

## 2023-07-07 ENCOUNTER — Encounter: Payer: Self-pay | Admitting: Pulmonary Disease

## 2023-07-07 DIAGNOSIS — G4733 Obstructive sleep apnea (adult) (pediatric): Secondary | ICD-10-CM

## 2023-07-07 NOTE — Telephone Encounter (Signed)
Ok to place an order for a new CPAP machine?

## 2023-07-08 NOTE — Telephone Encounter (Signed)
Yes, okay to place order for new CPAP machine   Auto CPAP 5-15 with heated humidification

## 2023-07-10 ENCOUNTER — Ambulatory Visit
Admission: RE | Admit: 2023-07-10 | Discharge: 2023-07-10 | Disposition: A | Discharge status: Home / Self Care | Payer: 59 | Source: Ambulatory Visit

## 2023-07-10 DIAGNOSIS — Z780 Asymptomatic menopausal state: Secondary | ICD-10-CM

## 2023-07-18 NOTE — Progress Notes (Signed)
HPI: Wanda Thornton is a 62 y.o. female with a PMHx significant for HTN, asthma, OSA on CPAP, seasonal allergies, vitamin D deficiency, HLD, and sarcoidosis, who is here today for her routine physical.  Last CPE: 07/19/2022  Exercise: She walks 3x per week and takes the stairs frequently at home and at work.  Diet: She usually cooks at home, but has been eating out more recently due to a death in the family. She eats vegetables daily. She eats primarily chicken and fish, and avoids red meat. She snacks on chocolate and chips.  Sleep: She reports she is sleeping ~8 hours per night. She mentions she is having some trouble with her CPAP lately. Alcohol Use: She reports no alcohol intake.  Smoking: never Vision: UTD on routine vision care.  Dental: UTD on routine dental care.   Immunization History  Administered Date(s) Administered   Influenza, Seasonal, Injecte, Preservative Fre 06/30/2023   Influenza,inj,Quad PF,6+ Mos 10/16/1998, 06/17/2017, 06/17/2018, 06/29/2019, 07/10/2020, 07/23/2021, 07/19/2022   Moderna Sars-Covid-2 Vaccination 12/16/2019, 01/18/2020, 08/05/2020, 02/22/2021, 07/08/2021   Pfizer Covid-19 Vaccine Bivalent Booster 5y-11y 06/29/2022   Pneumococcal Polysaccharide-23 11/07/2015   Tdap 09/15/2009, 10/08/2019   Zoster Recombinant(Shingrix) 07/19/2022, 09/18/2022   Health Maintenance  Topic Date Due   Cervical Cancer Screening (HPV/Pap Cotest)  11/27/2018   COVID-19 Vaccine (7 - 2023-24 season) 06/01/2023   MAMMOGRAM  07/22/2024   Colonoscopy  08/08/2026   DTaP/Tdap/Td (3 - Td or Tdap) 10/07/2029   INFLUENZA VACCINE  Completed   Hepatitis C Screening  Completed   Zoster Vaccines- Shingrix  Completed   HPV VACCINES  Aged Out   HIV Screening  Discontinued   Chronic medical problems:   Hypertension: She is currently taking amlodipine 10 mg daily. BP readings at home: She is checking at home. She says her readings are in the 120s/80s. Side effects:  none Negative for unusual or severe headache, visual changes, exertional chest pain, dyspnea,  focal weakness, or edema. HypoK+:She is taking potassium supplementation.  Lab Results  Component Value Date   CREATININE 0.72 07/19/2022   BUN 12 07/19/2022   NA 142 07/19/2022   K 3.5 08/14/2022   CL 105 07/19/2022   CO2 27 07/19/2022   Asthma: She is currently taking albuterol 108 (90 base) MCG/ACT inhaler, advair 250-50 MCG/ACT AEPB, and singulair 10 mg daily. She reports she only needs her albuterol once every few weeks.  She is now seeing pulmonology for her asthma and sleep apnea. She follows with them yearly.   She sees her endocrinologist yearly.   Vit D def: She is taking vitamin D supplementation 2000 units daily.   Concerns today: none  Review of Systems  Constitutional:  Negative for activity change, appetite change and fever.  HENT:  Negative for mouth sores, sore throat and trouble swallowing.   Eyes:  Negative for redness and visual disturbance.  Respiratory:  Negative for cough, shortness of breath and wheezing.   Cardiovascular:  Negative for chest pain, palpitations and leg swelling.  Gastrointestinal:  Negative for abdominal pain, nausea and vomiting.       No changes in bowel habits.  Endocrine: Negative for cold intolerance, heat intolerance, polydipsia, polyphagia and polyuria.  Genitourinary:  Negative for decreased urine volume, dysuria, hematuria, vaginal bleeding and vaginal discharge.  Musculoskeletal:  Negative for gait problem and myalgias.  Skin:  Negative for color change and rash.  Allergic/Immunologic: Positive for environmental allergies.  Neurological:  Negative for syncope, weakness and headaches.  Hematological:  Negative for adenopathy. Does not bruise/bleed easily.  Psychiatric/Behavioral:  Negative for confusion. The patient is not nervous/anxious.   All other systems reviewed and are negative.  Current Outpatient Medications on File Prior to  Visit  Medication Sig Dispense Refill   albuterol (VENTOLIN HFA) 108 (90 Base) MCG/ACT inhaler USE 2 INHALATIONS EVERY 4 TO 6 HOURS AS NEEDED FOR DYSPNEA AND WHEEZING 25.5 g 3   amLODipine (NORVASC) 10 MG tablet TAKE 1 TABLET DAILY 90 tablet 3   B Complex-C (B-COMPLEX WITH VITAMIN C) tablet Take 1 tablet by mouth daily.     CALCIUM CITRATE PO Take 2 tablets by mouth 2 (two) times daily.     cholecalciferol (VITAMIN D) 1000 units tablet Take 2,000 Units by mouth daily.      CRANBERRY PO Take 1 tablet by mouth daily.     fexofenadine (ALLEGRA) 180 MG tablet Take 180 mg by mouth daily.     fluticasone (FLONASE) 50 MCG/ACT nasal spray USE 2 SPRAYS IN EACH NOSTRIL DAILY AS NEEDED FOR ALLERGIES 48 g 3   fluticasone-salmeterol (ADVAIR) 250-50 MCG/ACT AEPB Inhale 1 puff then rinse mouth, twice daily 180 each 4   KLOR-CON M20 20 MEQ tablet TAKE 1 TABLET TWICE A DAY 180 tablet 3   montelukast (SINGULAIR) 10 MG tablet TAKE 1 TABLET AT BEDTIME 90 tablet 3   No current facility-administered medications on file prior to visit.    Past Medical History:  Diagnosis Date   Allergy    Arthritis    fingers   Asthma    Hypertension    Sleep apnea    cpap    Past Surgical History:  Procedure Laterality Date   BREAST BIOPSY Right    PARTIAL HYSTERECTOMY  2005   TRIGGER FINGER RELEASE     wisdom teeth      Allergies  Allergen Reactions   Amoxicillin Hives   Bactrim [Sulfamethoxazole-Trimethoprim] Swelling    Anaphylaxis    Clindamycin/Lincomycin Swelling    Anaphylaxis    Hydrocodone     Nausea vomiting   Keflex [Cephalexin] Swelling    Anaphylaxis    Lisinopril     headache   Family History  Problem Relation Age of Onset   Hyperlipidemia Mother    Hypertension Mother    Hypertension Father    Hyperlipidemia Father    Diabetes Sister    Cancer Sister        breast   Breast cancer Sister    Diabetes Sister    Cancer Sister        breast   Breast cancer Sister    Breast cancer  Maternal Aunt    Colon cancer Neg Hx    Colon polyps Neg Hx    Esophageal cancer Neg Hx    Rectal cancer Neg Hx    Stomach cancer Neg Hx     Social History   Socioeconomic History   Marital status: Married    Spouse name: Not on file   Number of children: Not on file   Years of education: Not on file   Highest education level: Not on file  Occupational History   Not on file  Tobacco Use   Smoking status: Never    Passive exposure: Yes   Smokeless tobacco: Never  Vaping Use   Vaping status: Never Used  Substance and Sexual Activity   Alcohol use: No   Drug use: No   Sexual activity: Yes  Other Topics Concern   Not on file  Social History Narrative   Not on file   Social Determinants of Health   Financial Resource Strain: Not on file  Food Insecurity: Not on file  Transportation Needs: Not on file  Physical Activity: Not on file  Stress: Not on file  Social Connections: Not on file   Today's Vitals   07/21/23 0751  BP: 120/80  Pulse: 100  Resp: 12  Temp: 98.9 F (37.2 C)  TempSrc: Oral  SpO2: 99%  Weight: 177 lb 4 oz (80.4 kg)  Height: 5\' 2"  (1.575 m)   Body mass index is 32.42 kg/m.  Wt Readings from Last 3 Encounters:  06/30/23 176 lb (79.8 kg)  06/03/23 178 lb 3.2 oz (80.8 kg)  03/26/23 175 lb 3.2 oz (79.5 kg)   Physical Exam Vitals and nursing note reviewed.  Constitutional:      General: She is not in acute distress.    Appearance: She is well-developed.  HENT:     Head: Normocephalic and atraumatic.     Right Ear: Hearing, tympanic membrane, ear canal and external ear normal.     Left Ear: Hearing, tympanic membrane, ear canal and external ear normal.     Mouth/Throat:     Mouth: Mucous membranes are moist.     Pharynx: Oropharynx is clear. Uvula midline.  Eyes:     Extraocular Movements: Extraocular movements intact.     Conjunctiva/sclera: Conjunctivae normal.     Pupils: Pupils are equal, round, and reactive to light.  Neck:      Thyroid: Thyromegaly present. No thyroid mass.  Cardiovascular:     Rate and Rhythm: Normal rate and regular rhythm.     Pulses:          Dorsalis pedis pulses are 2+ on the right side and 2+ on the left side.     Heart sounds: No murmur heard. Pulmonary:     Effort: Pulmonary effort is normal. No respiratory distress.     Breath sounds: Normal breath sounds.  Abdominal:     Palpations: Abdomen is soft. There is no hepatomegaly or mass.     Tenderness: There is no abdominal tenderness.  Genitourinary:    Comments: No concerns today. Musculoskeletal:     Right lower leg: No edema.     Left lower leg: No edema.     Comments: No major deformity or signs of synovitis appreciated.  Lymphadenopathy:     Cervical: No cervical adenopathy.     Upper Body:     Right upper body: No supraclavicular adenopathy.     Left upper body: No supraclavicular adenopathy.  Skin:    General: Skin is warm.     Findings: No erythema or rash.  Neurological:     General: No focal deficit present.     Mental Status: She is alert and oriented to person, place, and time.     Cranial Nerves: No cranial nerve deficit.     Coordination: Coordination normal.     Gait: Gait normal.     Deep Tendon Reflexes:     Reflex Scores:      Bicep reflexes are 2+ on the right side and 2+ on the left side.      Patellar reflexes are 2+ on the right side and 2+ on the left side. Psychiatric:        Mood and Affect: Mood and affect normal.   ASSESSMENT AND PLAN:  Wanda Thornton was here today for her annual physical examination.  Lab Results  Component Value Date   NA 143 07/21/2023   CL 104 07/21/2023   K 3.7 07/21/2023   CO2 31 07/21/2023   BUN 12 07/21/2023   CREATININE 0.75 07/21/2023   GFR 86.86 07/21/2023   CALCIUM 9.3 07/21/2023   ALBUMIN 4.3 07/21/2023   GLUCOSE 95 07/21/2023   Lab Results  Component Value Date   ALT 16 07/21/2023   AST 21 07/21/2023   ALKPHOS 92 07/21/2023   BILITOT 0.9  07/21/2023   Lab Results  Component Value Date   CHOL 182 07/21/2023   HDL 52.60 07/21/2023   LDLCALC 116 (H) 07/21/2023   TRIG 68.0 07/21/2023   CHOLHDL 3 07/21/2023  The 10-year ASCVD risk score (Arnett DK, et al., 2019) is: 5%   Values used to calculate the score:     Age: 36 years     Sex: Female     Is Non-Hispanic African American: Yes     Diabetic: No     Tobacco smoker: No     Systolic Blood Pressure: 120 mmHg     Is BP treated: Yes     HDL Cholesterol: 52.6 mg/dL     Total Cholesterol: 182 mg/dL  Routine general medical examination at a health care facility Assessment & Plan: We discussed the importance of regular physical activity and healthy diet for prevention of chronic illness and/or complications. Preventive guidelines reviewed. Vaccination up to date. Ca++ and vit D supplementation to continue. S/P hysterectomy. Next CPE in a year.   Hypertension, essential, benign Assessment & Plan: BP adequately controlled. Continue Amlodipine 10 mg daily and DASH/low salt diet. Continue monitoring BP at home. Eye exam is current. As far as BP is stable, annual follow up is appropriate.  Orders: -     Comprehensive metabolic panel; Future  Dyslipidemia (high LDL; low HDL) Assessment & Plan: Non pharmacologic treatment to continue for now. Further recommendations will be given according to 10 years CVD risk score and lipid panel numbers.  Orders: -     Comprehensive metabolic panel; Future -     Lipid panel; Future  Vitamin D deficiency, unspecified Assessment & Plan: Continue current dose of vit D supplementation. Further recommendations according to 25 OH vit D result.  Orders: -     VITAMIN D 25 Hydroxy (Vit-D Deficiency, Fractures); Future  Hypokalemia Assessment & Plan: Last K+ 3.5 in 07/2022. Primary aldosteronism ruled out by her endocrinologist. Continue KLOR 20 meq x 2 daily. Further recommendations according to lab results.   Asthma,  extrinsic, moderate persistent, uncomplicated Assessment & Plan: Problem is well controlled. On Advair 250-50 mcg,Albuterol inh,and Singulair. Following with pulmonologist annually.   Return in 1 year (on 07/20/2024) for CPE.  I, Rolla Etienne Wierda, acting as a scribe for Lennis Rader [place], MD., have documented all relevant documentation on the behalf of Trip Cavanagh [place], MD, as directed by  Lucero Auzenne [place], MD while in the presence of Amando Ishikawa [place], MD.   I, Suanne Marker, have reviewed all documentation for this visit. The documentation on 07/18/23 for the exam, diagnosis, procedures, and orders are all accurate and complete.  Lisle Skillman G. [place], MD  [facility]. [place] office.

## 2023-07-21 ENCOUNTER — Encounter: Payer: Self-pay | Admitting: Family Medicine

## 2023-07-21 ENCOUNTER — Ambulatory Visit: Payer: 59 | Admitting: Family Medicine

## 2023-07-21 VITALS — BP 120/80 | HR 100 | Temp 98.9°F | Resp 12 | Ht 62.0 in | Wt 177.2 lb

## 2023-07-21 DIAGNOSIS — E559 Vitamin D deficiency, unspecified: Secondary | ICD-10-CM

## 2023-07-21 DIAGNOSIS — Z0000 Encounter for general adult medical examination without abnormal findings: Secondary | ICD-10-CM | POA: Diagnosis not present

## 2023-07-21 DIAGNOSIS — I10 Essential (primary) hypertension: Secondary | ICD-10-CM

## 2023-07-21 DIAGNOSIS — J4540 Moderate persistent asthma, uncomplicated: Secondary | ICD-10-CM

## 2023-07-21 DIAGNOSIS — E785 Hyperlipidemia, unspecified: Secondary | ICD-10-CM

## 2023-07-21 DIAGNOSIS — E876 Hypokalemia: Secondary | ICD-10-CM

## 2023-07-21 LAB — COMPREHENSIVE METABOLIC PANEL
ALT: 16 U/L (ref 0–35)
AST: 21 U/L (ref 0–37)
Albumin: 4.3 g/dL (ref 3.5–5.2)
Alkaline Phosphatase: 92 U/L (ref 39–117)
BUN: 12 mg/dL (ref 6–23)
CO2: 31 meq/L (ref 19–32)
Calcium: 9.3 mg/dL (ref 8.4–10.5)
Chloride: 104 meq/L (ref 96–112)
Creatinine, Ser: 0.75 mg/dL (ref 0.40–1.20)
GFR: 86.86 mL/min (ref >60.00)
Glucose, Bld: 95 mg/dL (ref 70–99)
Potassium: 3.7 meq/L (ref 3.5–5.1)
Sodium: 143 meq/L (ref 135–145)
Total Bilirubin: 0.9 mg/dL (ref 0.2–1.2)
Total Protein: 7.3 g/dL (ref 6.0–8.3)

## 2023-07-21 LAB — LIPID PANEL
Cholesterol: 182 mg/dL (ref 0–200)
HDL: 52.6 mg/dL (ref >39.00)
LDL Cholesterol: 116 mg/dL — ABNORMAL HIGH (ref 0–99)
NonHDL: 129.67
Total CHOL/HDL Ratio: 3
Triglycerides: 68 mg/dL (ref 0.0–149.0)
VLDL: 13.6 mg/dL (ref 0.0–40.0)

## 2023-07-21 LAB — VITAMIN D 25 HYDROXY (VIT D DEFICIENCY, FRACTURES): VITD: 50.59 ng/mL (ref 30.00–100.00)

## 2023-07-21 NOTE — Assessment & Plan Note (Signed)
We discussed the importance of regular physical activity and healthy diet for prevention of chronic illness and/or complications. Preventive guidelines reviewed. Vaccination up to date. Ca++ and vit D supplementation to continue. S/P hysterectomy. Next CPE in a year.

## 2023-07-21 NOTE — Assessment & Plan Note (Signed)
BP adequately controlled. Continue Amlodipine 10 mg daily and DASH/low salt diet. Continue monitoring BP at home. Eye exam is current. As far as BP is stable, annual follow up is appropriate.

## 2023-07-21 NOTE — Assessment & Plan Note (Addendum)
Last K+ 3.5 in 07/2022. Primary aldosteronism ruled out by her endocrinologist. Continue KLOR 20 meq x 2 daily. Further recommendations according to lab results.

## 2023-07-21 NOTE — Patient Instructions (Addendum)
A few things to remember from today's visit:  Routine general medical examination at a health care facility  Hypertension, essential, benign - Plan: Comprehensive metabolic panel  Dyslipidemia (high LDL; low HDL) - Plan: Comprehensive metabolic panel, Lipid panel  Vitamin D deficiency, unspecified - Plan: VITAMIN D 25 Hydroxy (Vit-D Deficiency, Fractures)  If you need refills for medications you take chronically, please call your pharmacy. Do not use My Chart to request refills or for acute issues that need immediate attention. If you send a my chart message, it may take a few days to be addressed, specially if I am not in the office.  Please be sure medication list is accurate. If a new problem present, please set up appointment sooner than planned today.  Health Maintenance, Female Adopting a healthy lifestyle and getting preventive care are important in promoting health and wellness. Ask your health care provider about: The right schedule for you to have regular tests and exams. Things you can do on your own to prevent diseases and keep yourself healthy. What should I know about diet, weight, and exercise? Eat a healthy diet  Eat a diet that includes plenty of vegetables, fruits, low-fat dairy products, and lean protein. Do not eat a lot of foods that are high in solid fats, added sugars, or sodium. Maintain a healthy weight Body mass index (BMI) is used to identify weight problems. It estimates body fat based on height and weight. Your health care provider can help determine your BMI and help you achieve or maintain a healthy weight. Get regular exercise Get regular exercise. This is one of the most important things you can do for your health. Most adults should: Exercise for at least 150 minutes each week. The exercise should increase your heart rate and make you sweat (moderate-intensity exercise). Do strengthening exercises at least twice a week. This is in addition to the  moderate-intensity exercise. Spend less time sitting. Even light physical activity can be beneficial. Watch cholesterol and blood lipids Have your blood tested for lipids and cholesterol at 60 years of age, then have this test every 5 years. Have your cholesterol levels checked more often if: Your lipid or cholesterol levels are high. You are older than 60 years of age. You are at high risk for heart disease. What should I know about cancer screening? Depending on your health history and family history, you may need to have cancer screening at various ages. This may include screening for: Breast cancer. Cervical cancer. Colorectal cancer. Skin cancer. Lung cancer. What should I know about heart disease, diabetes, and high blood pressure? Blood pressure and heart disease High blood pressure causes heart disease and increases the risk of stroke. This is more likely to develop in people who have high blood pressure readings or are overweight. Have your blood pressure checked: Every 3-5 years if you are 57-29 years of age. Every year if you are 33 years old or older. Diabetes Have regular diabetes screenings. This checks your fasting blood sugar level. Have the screening done: Once every three years after age 89 if you are at a normal weight and have a low risk for diabetes. More often and at a younger age if you are overweight or have a high risk for diabetes. What should I know about preventing infection? Hepatitis B If you have a higher risk for hepatitis B, you should be screened for this virus. Talk with your health care provider to find out if you are at risk for  hepatitis B infection. Hepatitis C Testing is recommended for: Everyone born from [birth year] through 1963. Anyone with known risk factors for hepatitis C. Sexually transmitted infections (STIs) Get screened for STIs, including gonorrhea and chlamydia, if: You are sexually active and are younger than 60 years of age. You are  older than 60 years of age and your health care provider tells you that you are at risk for this type of infection. Your sexual activity has changed since you were last screened, and you are at increased risk for chlamydia or gonorrhea. Ask your health care provider if you are at risk. Ask your health care provider about whether you are at high risk for HIV. Your health care provider may recommend a prescription medicine to help prevent HIV infection. If you choose to take medicine to prevent HIV, you should first get tested for HIV. You should then be tested every 3 months for as long as you are taking the medicine. Pregnancy If you are about to stop having your period (premenopausal) and you may become pregnant, seek counseling before you get pregnant. Take 400 to 800 micrograms (mcg) of folic acid every day if you become pregnant. Ask for birth control (contraception) if you want to prevent pregnancy. Osteoporosis and menopause Osteoporosis is a disease in which the bones lose minerals and strength with aging. This can result in bone fractures. If you are 57 years old or older, or if you are at risk for osteoporosis and fractures, ask your health care provider if you should: Be screened for bone loss. Take a calcium or vitamin D supplement to lower your risk of fractures. Be given hormone replacement therapy (HRT) to treat symptoms of menopause. Follow these instructions at home: Alcohol use Do not drink alcohol if: Your health care provider tells you not to drink. You are pregnant, may be pregnant, or are planning to become pregnant. If you drink alcohol: Limit how much you have to: 0-1 drink a day. Know how much alcohol is in your drink. In the [place] one drink equals one 12 oz bottle of beer (355 mL), one 5 oz glass of wine (148 mL), or one 1 oz glass of hard liquor (44 mL). Lifestyle Do not use any products that contain nicotine or tobacco. These products include cigarettes, chewing  tobacco, and vaping devices, such as e-cigarettes. If you need help quitting, ask your health care provider. Do not use street drugs. Do not share needles. Ask your health care provider for help if you need support or information about quitting drugs. General instructions Schedule regular health, dental, and eye exams. Stay current with your vaccines. Tell your health care provider if: You often feel depressed. You have ever been abused or do not feel safe at home. Summary Adopting a healthy lifestyle and getting preventive care are important in promoting health and wellness. Follow your health care provider's instructions about healthy diet, exercising, and getting tested or screened for diseases. Follow your health care provider's instructions on monitoring your cholesterol and blood pressure. This information is not intended to replace advice given to you by your health care provider. Make sure you discuss any questions you have with your health care provider. Document Revised: 02/05/2021 Document Reviewed: 02/05/2021 Elsevier Patient Education  2024 [employer].

## 2023-07-21 NOTE — Assessment & Plan Note (Signed)
Problem is well controlled. On Advair 250-50 mcg,Albuterol inh,and Singulair. Following with pulmonologist annually.

## 2023-07-21 NOTE — Assessment & Plan Note (Signed)
Non pharmacologic treatment to continue for now. Further recommendations will be given according to 10 years CVD risk score and lipid panel numbers.  

## 2023-07-21 NOTE — Assessment & Plan Note (Signed)
Continue current dose of vit D supplementation. Further recommendations according to 25 OH vit D result. 

## 2023-07-28 ENCOUNTER — Ambulatory Visit
Admission: RE | Admit: 2023-07-28 | Discharge: 2023-07-28 | Disposition: A | Discharge status: Home / Self Care | Payer: 59 | Source: Ambulatory Visit | Attending: Family Medicine | Admitting: Family Medicine

## 2023-07-28 DIAGNOSIS — Z1231 Encounter for screening mammogram for malignant neoplasm of breast: Secondary | ICD-10-CM

## 2023-08-26 NOTE — Progress Notes (Signed)
Name: Wanda Thornton  MRN/ DOB: [id number], 1960/11/10    Age/ Sex: 62 y.o., female     PCP: [place], Betty G, MD   Reason for Endocrinology Evaluation: MNG     Initial Endocrinology Clinic Visit: 08/12/2020    PATIENT IDENTIFIER: Wanda Thornton is a 62 y.o., female with a past medical history of HTN, OSA on CPAP and dyslipidemia . She has followed with [facility] since 08/12/2020 for consultative assistance with management of her MNG.   HISTORICAL SUMMARY:  Pt noted to have thyromegaly during an examination in 06/2019, which prompted a thyroid ultrasound demonstrating MNG.  She is S/P FNA of the 1.8 cm left inferior nodule with benign cytology on 09/01/2019   Denies radiation exposure      NO FH of thyroid disease   Abnormal Aldo/renin ratio:  Patient has been noted with suppressed renin 0.14 NG/DL and elevated Aldo/PRA ratio at 142.9 in November 2023.  Saline loading showed normalization of aldosterone at 3.4 NG/DL December 2130.  SUBJECTIVE:     Today (08/27/2023):  Wanda Thornton is here for a follow up on MNG.   Patient has been following up with pulmonary for abnormal CT scan of the lung  Denies local neck swelling  Denies palpitations Denies loose stools or diarrhea  Denies hand tremors Denies SOB or cough    HISTORY:  Past Medical History:  Past Medical History:  Diagnosis Date   Allergy    Arthritis    fingers   Asthma    Hypertension    Sleep apnea    cpap   Past Surgical History:  Past Surgical History:  Procedure Laterality Date   BREAST BIOPSY Right    PARTIAL HYSTERECTOMY  2005   TRIGGER FINGER RELEASE     wisdom teeth     Social History:  reports that she has never smoked. She has been exposed to tobacco smoke. She has never used smokeless tobacco. She reports that she does not drink alcohol and does not use drugs. Family History:  Family History  Problem Relation Age of Onset   Hyperlipidemia Mother     Hypertension Mother    Hypertension Father    Hyperlipidemia Father    Diabetes Sister    Cancer Sister        breast   Breast cancer Sister    Diabetes Sister    Cancer Sister        breast   Breast cancer Sister    Breast cancer Maternal Aunt    Colon cancer Neg Hx    Colon polyps Neg Hx    Esophageal cancer Neg Hx    Rectal cancer Neg Hx    Stomach cancer Neg Hx      HOME MEDICATIONS: Allergies as of 08/27/2023       Reactions   Amoxicillin Hives   Bactrim [sulfamethoxazole-trimethoprim] Swelling   Anaphylaxis    Clindamycin/lincomycin Swelling   Anaphylaxis    Hydrocodone    Nausea vomiting   Keflex [cephalexin] Swelling   Anaphylaxis    Lisinopril    headache        Medication List        Accurate as of August 27, 2023  7:37 AM. If you have any questions, ask your nurse or doctor.          albuterol 108 (90 Base) MCG/ACT inhaler Commonly known as: VENTOLIN HFA USE 2 INHALATIONS EVERY 4 TO 6 HOURS AS NEEDED FOR DYSPNEA AND  WHEEZING   amLODipine 10 MG tablet Commonly known as: NORVASC TAKE 1 TABLET DAILY   B-complex with vitamin C tablet Take 1 tablet by mouth daily.   CALCIUM CITRATE PO Take 2 tablets by mouth 2 (two) times daily.   cholecalciferol 1000 units tablet Commonly known as: VITAMIN D Take 2,000 Units by mouth daily.   CRANBERRY PO Take 1 tablet by mouth daily.   fexofenadine 180 MG tablet Commonly known as: ALLEGRA Take 180 mg by mouth daily.   fluticasone 50 MCG/ACT nasal spray Commonly known as: FLONASE USE 2 SPRAYS IN EACH NOSTRIL DAILY AS NEEDED FOR ALLERGIES   fluticasone-salmeterol 250-50 MCG/ACT Aepb Commonly known as: ADVAIR Inhale 1 puff then rinse mouth, twice daily   Klor-Con M20 20 MEQ tablet Generic drug: potassium chloride SA TAKE 1 TABLET TWICE A DAY   Magnesium 250 MG Caps   montelukast 10 MG tablet Commonly known as: SINGULAIR TAKE 1 TABLET AT BEDTIME          OBJECTIVE:   PHYSICAL  EXAM: VS: BP 110/72 (BP Location: Left Arm, Patient Position: Sitting, Cuff Size: Large)   Pulse 92   Ht 5\' 2"  (1.575 m)   Wt 180 lb (81.6 kg)   SpO2 98%   BMI 32.92 kg/m    EXAM: General: Pt appears well and is in NAD  Neck: General: Supple without adenopathy. Thyroid: no nodules appreciated   Lungs: Clear with good BS bilat  Heart: Auscultation: RRR.  Abdomen: Soft, nontender  Extremities:  BL LE: No pretibial edema  Mental Status: Judgment, insight: Intact Orientation: Oriented to time, place, and person Mood and affect: No depression, anxiety, or agitation     DATA REVIEWED:  Latest Reference Range & Units 08/27/23 07:59  TSH 0.40 - 4.50 mIU/L 1.52  Triiodothyronine,Free,Serum 2.3 - 4.2 pg/mL 3.3  T4,Free(Direct) 0.8 - 1.8 ng/dL 1.1     Latest Reference Range & Units 07/21/23 08:31  Sodium 135 - 145 mEq/L 143  Potassium 3.5 - 5.1 mEq/L 3.7  Chloride 96 - 112 mEq/L 104  CO2 19 - 32 mEq/L 31  Glucose 70 - 99 mg/dL 95  BUN 6 - 23 mg/dL 12  Creatinine 4.09 - 8.11 mg/dL 9.14  Calcium 8.4 - 78.2 mg/dL 9.3  Alkaline Phosphatase 39 - 117 U/L 92  Albumin 3.5 - 5.2 g/dL 4.3  AST 0 - 37 U/L 21  ALT 0 - 35 U/L 16  Total Protein 6.0 - 8.3 g/dL 7.3  Total Bilirubin 0.2 - 1.2 mg/dL 0.9  GFR >95.62 mL/min 86.86     Thyroid Ultrasound 08/28/2022 Estimated total number of nodules >/= 1 cm: 1   Number of spongiform nodules >/=  2 cm not described below (TR1): 0   Number of mixed cystic and solid nodules >/= 1.5 cm not described below (TR2): 0   _________________________________________________________   Nodule 1: 0.8 cm cystic nodule in the superior right thyroid lobe is unchanged from prior examination and does not meet criteria for FNA or imaging follow-up.   _________________________________________________________   Nodule 2: 0.9 x 0.4 x 0.8 cm solid hypoechoic nodule located within or posterior to the mid right thyroid lobe is unchanged since  prior examination. If this is a thyroid nodule, it would be TI-RADS 4 and does not meet criteria for FNA or imaging surveillance. This may also represent a parathyroid adenoma given its location. This same nodule measures 0.8 x 0.6 x 0.4 cm on the examination from 07/07/2019.   _________________________________________________________  Nodule 3: 1.0 x 0.6 x 0.8 cm solid isoechoic left inferior thyroid nodule is not significantly changed since prior examination. Please correlate with prior FNA results from 09/01/2019.   IMPRESSION: 1. Nodule 3 located in the left inferior thyroid lobe is not significantly changed in size since prior examination. Please correlate with prior FNA results from 09/01/2019. 2. Nodule 2 (TI-RADS 4) located within or posterior to the mid right thyroid lobe is unchanged since prior examination. If this nodule is a thyroid nodule, it would not meet criteria for FNA or imaging surveillance. Alternatively it could represent a parathyroid adenoma. Please correlate with laboratory workup for hyperparathyroidism.    FNA left inferior 09/01/2019   Clinical History: Nodule 2 Left Inferior 1.8 cm; other 2 dimensions: 1.3 x 0.8 cm, Solid / almost completely solid, Hypoechoic, ACR TI-RADS total points: 4, Moderately suspicious nodule Specimen Submitted:  A. THYROID, LT LOBE LLP, FINE NEEDLE ASPIRATION:   FINAL MICROSCOPIC DIAGNOSIS: - Consistent with benign follicular nodule (Bethesda category II)    ASSESSMENT / PLAN / RECOMMENDATIONS:   MNG:  - No local neck symptoms - She is clinically and biochemically euthyroid  - S/P benign FNA of the left inferior 1.8 cm nodule  - Will repeat thyroid ultrasound this year  -TFTs are normal    2. Hyperaldosteronism:  -Patient had an abnormal screening test with suppressed renin and elevated Aldo/renin ratio -Confirmation with saline loading, showed decrease aldosterone from 27.9 to 3.4 ng/dL  - Rules OUT  hyperaldosteronism 08/2022     F/U in 1 yr    Signed electronically by: Lyndle Herrlich, MD  [facility] Endocrinology  [facility] [address] [place] [place], [place] [address] Phone: [phone] FAX: [phone]      CC: [place], Betty G, MD [address] [place] [address] Phone: [phone]  Fax: [phone]   Return to Endocrinology clinic as below: No future appointments.

## 2023-08-27 ENCOUNTER — Encounter: Payer: Self-pay | Admitting: Internal Medicine

## 2023-08-27 ENCOUNTER — Ambulatory Visit: Payer: 59 | Admitting: Internal Medicine

## 2023-08-27 VITALS — BP 110/72 | HR 92 | Ht 62.0 in | Wt 180.0 lb

## 2023-08-27 DIAGNOSIS — E042 Nontoxic multinodular goiter: Secondary | ICD-10-CM

## 2023-08-28 LAB — T4, FREE: Free T4: 1.1 ng/dL (ref 0.8–1.8)

## 2023-08-28 LAB — T3, FREE: T3, Free: 3.3 pg/mL (ref 2.3–4.2)

## 2023-08-28 LAB — TSH: TSH: 1.52 m[IU]/L (ref 0.40–4.50)

## 2023-09-01 ENCOUNTER — Ambulatory Visit
Admission: RE | Admit: 2023-09-01 | Discharge: 2023-09-01 | Disposition: A | Discharge status: Home / Self Care | Payer: 59 | Source: Ambulatory Visit | Attending: Internal Medicine | Admitting: Internal Medicine

## 2023-09-01 DIAGNOSIS — E042 Nontoxic multinodular goiter: Secondary | ICD-10-CM

## 2023-09-22 ENCOUNTER — Ambulatory Visit (INDEPENDENT_AMBULATORY_CARE_PROVIDER_SITE_OTHER): Payer: 59 | Admitting: Family Medicine

## 2023-09-22 ENCOUNTER — Encounter: Payer: Self-pay | Admitting: Family Medicine

## 2023-09-22 VITALS — BP 126/80 | HR 96 | Temp 98.3°F | Resp 16 | Ht 62.0 in | Wt 181.4 lb

## 2023-09-22 DIAGNOSIS — R0981 Nasal congestion: Secondary | ICD-10-CM

## 2023-09-22 DIAGNOSIS — J3089 Other allergic rhinitis: Secondary | ICD-10-CM | POA: Diagnosis not present

## 2023-09-22 DIAGNOSIS — J4540 Moderate persistent asthma, uncomplicated: Secondary | ICD-10-CM

## 2023-09-22 DIAGNOSIS — J302 Other seasonal allergic rhinitis: Secondary | ICD-10-CM

## 2023-09-22 MED ORDER — PREDNISONE 20 MG PO TABS: 40.0000 mg | ORAL_TABLET | Freq: Every day | ORAL | 0 refills | Status: AC

## 2023-09-22 NOTE — Assessment & Plan Note (Signed)
Currently she is not having acute symptoms. Continue  Advair 250-50 mcg,Albuterol inh 1-2 puff qid prn,and Singulair 10 mg daily. Instructed about warning signs.

## 2023-09-22 NOTE — Assessment & Plan Note (Signed)
With acute exacerbation. Continue Flonase nasal spray, can take it daily for 10 to 14 days then as needed. Nasal saline irrigations as needed throughout the day. Continue OTC antihistaminic, currently on Allegra 180 mg daily.If needed can change to Zyrtec for about 2 weeks as instructed.

## 2023-09-22 NOTE — Progress Notes (Signed)
ACUTE VISIT Chief Complaint  Patient presents with   Nasal Congestion    Started on Wednesday along with itchy throat. Was exposed to heavy perfume at work on Tuesday.    HPI: [name] is a 62 y.o. female with a PMHx significant for HTN, asthma, OSA on CPAP, dyslipidemia, and vitamin D deficiency, who is here today complaining of nasal congestion as described above.   Patient complains of nasal congestion since recently having her braces removed. She says the glue from her braces went up in to her nose and down into her throat.  She endorses associated itchy throat and cough, rhinorrhea during the day, as well as skin pruritus that resolved after taking benadryl.  In addition to the benadryl, she has also taken allegra 180 mg daily and been using Flonase nasal spray.   Asthma: She also has albuterol inh, which has not needed and takes Advair 250-50 mcg bid and Singulair 10 mg daily.  Denies wheezing, stridor, dysphagia,SOB,or CP. Pertinent negatives include fever, chills, body aches, eye or nose pruritus, or difficulty swallowing.  No sick contact.  Review of Systems  Constitutional:  Negative for activity change, appetite change, chills, diaphoresis and unexpected weight change.  HENT:  Positive for postnasal drip and rhinorrhea. Negative for facial swelling, mouth sores, nosebleeds, trouble swallowing and voice change.   Cardiovascular:  Negative for palpitations and leg swelling.  Gastrointestinal:  Negative for abdominal pain, nausea and vomiting.  Genitourinary:  Negative for decreased urine volume, dysuria and hematuria.  Musculoskeletal:  Negative for gait problem and myalgias.  Skin:  Negative for rash.  Allergic/Immunologic: Positive for environmental allergies.  Neurological:  Negative for syncope, weakness and headaches.  See other pertinent positives and negatives in HPI.  Current Outpatient Medications on File Prior to Visit  Medication Sig Dispense  Refill   albuterol (VENTOLIN HFA) 108 (90 Base) MCG/ACT inhaler USE 2 INHALATIONS EVERY 4 TO 6 HOURS AS NEEDED FOR DYSPNEA AND WHEEZING 25.5 g 3   amLODipine (NORVASC) 10 MG tablet TAKE 1 TABLET DAILY 90 tablet 3   B Complex-C (B-COMPLEX WITH VITAMIN C) tablet Take 1 tablet by mouth daily.     CALCIUM CITRATE PO Take 2 tablets by mouth 2 (two) times daily.     cholecalciferol (VITAMIN D) 1000 units tablet Take 2,000 Units by mouth daily.      CRANBERRY PO Take 1 tablet by mouth daily.     fexofenadine (ALLEGRA) 180 MG tablet Take 180 mg by mouth daily.     fluticasone (FLONASE) 50 MCG/ACT nasal spray USE 2 SPRAYS IN EACH NOSTRIL DAILY AS NEEDED FOR ALLERGIES 48 g 3   fluticasone-salmeterol (ADVAIR) 250-50 MCG/ACT AEPB Inhale 1 puff then rinse mouth, twice daily 180 each 4   KLOR-CON M20 20 MEQ tablet TAKE 1 TABLET TWICE A DAY 180 tablet 3   Magnesium 250 MG CAPS      montelukast (SINGULAIR) 10 MG tablet TAKE 1 TABLET AT BEDTIME 90 tablet 3   No current facility-administered medications on file prior to visit.   Past Medical History:  Diagnosis Date   Allergy    Arthritis    fingers   Asthma    Hypertension    Sleep apnea    cpap   Allergies  Allergen Reactions   Amoxicillin Hives   Bactrim [Sulfamethoxazole-Trimethoprim] Swelling    Anaphylaxis    Clindamycin/Lincomycin Swelling    Anaphylaxis    Hydrocodone     Nausea vomiting   Keflex [  Cephalexin] Swelling    Anaphylaxis    Lisinopril     headache    Social History   Socioeconomic History   Marital status: Married    Spouse name: Not on file   Number of children: Not on file   Years of education: Not on file   Highest education level: Bachelor's degree (e.g., BA, AB, BS)  Occupational History   Not on file  Tobacco Use   Smoking status: Never    Passive exposure: Yes   Smokeless tobacco: Never  Vaping Use   Vaping status: Never Used  Substance and Sexual Activity   Alcohol use: No   Drug use: No   Sexual  activity: Yes  Other Topics Concern   Not on file  Social History Narrative   Not on file   Social Drivers of Health   Financial Resource Strain: Low Risk  (09/21/2023)   Overall Financial Resource Strain (CARDIA)    Difficulty of Paying Living Expenses: Not hard at all  Food Insecurity: No Food Insecurity (09/21/2023)   Hunger Vital Sign    Worried About Running Out of Food in the Last Year: Never true    Ran Out of Food in the Last Year: Never true  Transportation Needs: No Transportation Needs (09/21/2023)   PRAPARE - Administrator, Civil Service (Medical): No    Lack of Transportation (Non-Medical): No  Physical Activity: Sufficiently Active (09/21/2023)   Exercise Vital Sign    Days of Exercise per Week: 3 days    Minutes of Exercise per Session: 60 min  Stress: No Stress Concern Present (09/21/2023)   [employer] of Occupational Health - Occupational Stress Questionnaire    Feeling of Stress : Not at all  Social Connections: Socially Isolated (09/21/2023)   Social Connection and Isolation Panel [NHANES]    Frequency of Communication with Friends and Family: Once a week    Frequency of Social Gatherings with Friends and Family: Once a week    Attends Religious Services: Never    Database administrator or Organizations: No    Attends Engineer, structural: Not on file    Marital Status: Married    Vitals:   09/22/23 0658  BP: 126/80  Pulse: 96  Resp: 16  Temp: 98.3 F (36.8 C)  SpO2: 98%   Body mass index is 33.17 kg/m.  Physical Exam Vitals and nursing note reviewed.  Constitutional:      General: She is not in acute distress.    Appearance: She is well-developed.  HENT:     Head: Normocephalic and atraumatic.     Nose: Septal deviation and congestion present.     Right Turbinates: Enlarged.     Left Turbinates: Enlarged.     Mouth/Throat:     Mouth: Mucous membranes are moist.     Pharynx: Oropharynx is clear. No pharyngeal  swelling.  Eyes:     Conjunctiva/sclera: Conjunctivae normal.  Cardiovascular:     Rate and Rhythm: Normal rate and regular rhythm.     Heart sounds: No murmur heard. Pulmonary:     Effort: Pulmonary effort is normal. No respiratory distress.     Breath sounds: Normal breath sounds.  Abdominal:     Palpations: Abdomen is soft.  Musculoskeletal:     Cervical back: No edema or erythema.  Lymphadenopathy:     Cervical: No cervical adenopathy.  Skin:    General: Skin is warm.     Findings: No erythema  or rash.  Neurological:     General: No focal deficit present.     Mental Status: She is alert and oriented to person, place, and time.     Cranial Nerves: No cranial nerve deficit.     Gait: Gait normal.  Psychiatric:        Mood and Affect: Mood and affect normal.    ASSESSMENT AND PLAN:  Ms. Zepf was seen today for nasal congestion.   Nasal congestion Acute with associated throat pruritus but no rash or angioedema. Recommend short course of Prednisone 40 mg for 3-5 days with breakfast, she has taken medication in the past and has been well tolerated. Continue Flonase nasal spray x 10-14 days then prn. If skin pruritus presents again, she can stop Allegra and take Zyrtec 10 mg bid for 2 weeks. Clearly instructed about warning signs.  -     predniSONE; Take 2 tablets (40 mg total) by mouth daily with breakfast for 5 days.  Dispense: 10 tablet; Refill: 0  Seasonal and perennial allergic rhinitis Assessment & Plan: With acute exacerbation. Continue Flonase nasal spray, can take it daily for 10 to 14 days then as needed. Nasal saline irrigations as needed throughout the day. Continue OTC antihistaminic, currently on Allegra 180 mg daily.If needed can change to Zyrtec for about 2 weeks as instructed.  Orders: -     predniSONE; Take 2 tablets (40 mg total) by mouth daily with breakfast for 5 days.  Dispense: 10 tablet; Refill: 0  Asthma, extrinsic, moderate persistent,  uncomplicated Assessment & Plan: Currently she is not having acute symptoms. Continue  Advair 250-50 mcg,Albuterol inh 1-2 puff qid prn,and Singulair 10 mg daily. Instructed about warning signs.  Return if symptoms worsen or fail to improve, for keep next appointment.  I, Rolla Etienne Wierda, acting as a scribe for Duchess Armendarez [place], MD., have documented all relevant documentation on the behalf of Strummer Canipe [place], MD, as directed by  Alisea Matte [place], MD while in the presence of Kaydra Borgen [place], MD.   I, Charmagne Buhl [place], MD, have reviewed all documentation for this visit. The documentation on 09/22/23 for the exam, diagnosis, procedures, and orders are all accurate and complete.  Rikayla Demmon G. [place], MD  [facility]. [place] office.

## 2023-09-22 NOTE — Patient Instructions (Addendum)
A few things to remember from today's visit:  Nasal congestion - Plan: predniSONE (DELTASONE) 20 MG tablet  Seasonal and perennial allergic rhinitis - Plan: predniSONE (DELTASONE) 20 MG tablet Flonase nasal spray daily at night ffpr 10-14 days. Nasal saline irrigations through the day. If itchy skin again,try stopping Allegra and start zyrtec 10 mg 2 times daily for 10-14 days. Prednisone with breakfast for 3-5 days.  If you need refills for medications you take chronically, please call your pharmacy. Do not use My Chart to request refills or for acute issues that need immediate attention. If you send a my chart message, it may take a few days to be addressed, specially if I am not in the office.  Please be sure medication list is accurate. If a new problem present, please set up appointment sooner than planned today.

## 2023-09-30 ENCOUNTER — Telehealth: Payer: Self-pay | Admitting: Internal Medicine

## 2023-09-30 NOTE — Telephone Encounter (Signed)
 Wanda Thornton - potential ILD patient who I saw summer 2024 and then did all her tests but no followup. Please give 15 min video or regular visit (no need for PFT or CT ) to discuss results please  Thanks    SIGNATURE    Dr. Dorethia Cave, M.D., F.C.C.P,  Pulmonary and Critical Care Medicine Staff Physician, [facility] Director - Interstitial Lung Disease  Program  Pulmonary Fibrosis [facility] at [facility] [place], [place], [address]   Pager: [phone], If no answer  -> Check AMION or Try [phone] Telephone (clinical office): [phone] Telephone (research): [phone]  6:58 PM 09/30/2023

## 2023-10-02 NOTE — Telephone Encounter (Signed)
 Patient scheduled 12/04/2023 with Dr. Marchelle Gearing.

## 2023-10-27 ENCOUNTER — Telehealth: Payer: Self-pay

## 2023-10-27 NOTE — Telephone Encounter (Signed)
Copied from CRM [id number]. Topic: Clinical - Medical Advice >> Oct 27, 2023 10:18 AM Corin V wrote: Reason for CRM: Patient's husband is sick and has a cold. She believes she has gotten it. She has a 101 degree temp, nose draining, headache. Symptoms started Sunday morning. Please let her know the best medication to take.

## 2023-10-27 NOTE — Telephone Encounter (Signed)
I called and spoke with patient. Appointment made for tomorrow at 9:30 am. Pt is aware she can take tylenol for the fever.

## 2023-10-28 ENCOUNTER — Encounter: Payer: Self-pay | Admitting: Family Medicine

## 2023-10-28 ENCOUNTER — Ambulatory Visit (INDEPENDENT_AMBULATORY_CARE_PROVIDER_SITE_OTHER): Payer: 59 | Admitting: Family Medicine

## 2023-10-28 VITALS — BP 124/80 | HR 104 | Temp 99.8°F | Resp 16 | Ht 62.0 in | Wt 180.5 lb

## 2023-10-28 DIAGNOSIS — R509 Fever, unspecified: Secondary | ICD-10-CM

## 2023-10-28 DIAGNOSIS — J069 Acute upper respiratory infection, unspecified: Secondary | ICD-10-CM | POA: Diagnosis not present

## 2023-10-28 LAB — POCT INFLUENZA A/B
Influenza A, POC: NEGATIVE
Influenza B, POC: NEGATIVE

## 2023-10-28 LAB — POC COVID19 BINAXNOW: SARS Coronavirus 2 Ag: NEGATIVE

## 2023-10-28 NOTE — Patient Instructions (Addendum)
A few things to remember from today's visit:  URI, acute  Fever, unspecified fever cause - Plan: POCT Influenza A/B, POC COVID-19 Continue Tylenol 500 mg 3-4 times per day as needed. Monitor for new symptoms. Flonase nasal spray at bedtime for 10-14 days and nasal saline irrigations through he day.  If you need refills for medications you take chronically, please call your pharmacy. Do not use My Chart to request refills or for acute issues that need immediate attention. If you send a my chart message, it may take a few days to be addressed, specially if I am not in the office.  Please be sure medication list is accurate. If a new problem present, please set up appointment sooner than planned today.

## 2023-10-28 NOTE — Progress Notes (Signed)
ACUTE VISIT Chief Complaint  Patient presents with   Fever    Started Sunday, high temp of 101 yesterday.    Nasal Congestion   HPI: Wanda Thornton is a 63 y.o. female with a PMHx significant for HTN, asthma, OSA on CPAP, dyslipidemia, and vitamin D deficiency, who is here today complaining of fever, headache, and congestion.   Patient complains of symptoms including fever, throbbing frontal headache, rhinorrhea, nasal congestion, and right ear ache starting on 1/26.  Congestion is slightly worse at night.  Her fever was 101.3 yesterday, and she took tylenol to bring it down. Hasn't taken tylenol today.   She says her headache has resolved and her right ear improved yesterday, and she feels she is getting better in general.  Mentions her husband was sick with similar symptoms on 1/23 and 1/24.   Pertinent negatives include cough, SOB, wheezing,abdominal pain, nausea, vomiting, diarrhea, or urinary symptoms.  Review of Systems  Constitutional:  Positive for activity change, chills and fatigue.  HENT:  Positive for postnasal drip. Negative for ear discharge, facial swelling, hearing loss and sinus pain.   Eyes:  Negative for discharge and redness.  Cardiovascular:  Negative for chest pain and leg swelling.  Gastrointestinal:  Negative for nausea and vomiting.  Skin:  Negative for rash.  Allergic/Immunologic: Positive for environmental allergies.  Neurological:  Negative for syncope, facial asymmetry and weakness.  See other pertinent positives and negatives in HPI.  Current Outpatient Medications on File Prior to Visit  Medication Sig Dispense Refill   albuterol (VENTOLIN HFA) 108 (90 Base) MCG/ACT inhaler USE 2 INHALATIONS EVERY 4 TO 6 HOURS AS NEEDED FOR DYSPNEA AND WHEEZING 25.5 g 3   amLODipine (NORVASC) 10 MG tablet TAKE 1 TABLET DAILY 90 tablet 3   B Complex-C (B-COMPLEX WITH VITAMIN C) tablet Take 1 tablet by mouth daily.     CALCIUM CITRATE PO Take 2 tablets by  mouth 2 (two) times daily.     cholecalciferol (VITAMIN D) 1000 units tablet Take 2,000 Units by mouth daily.      CRANBERRY PO Take 1 tablet by mouth daily.     fexofenadine (ALLEGRA) 180 MG tablet Take 180 mg by mouth daily.     fluticasone (FLONASE) 50 MCG/ACT nasal spray USE 2 SPRAYS IN EACH NOSTRIL DAILY AS NEEDED FOR ALLERGIES 48 g 3   fluticasone-salmeterol (ADVAIR) 250-50 MCG/ACT AEPB Inhale 1 puff then rinse mouth, twice daily 180 each 4   KLOR-CON M20 20 MEQ tablet TAKE 1 TABLET TWICE A DAY 180 tablet 3   Magnesium 250 MG CAPS      montelukast (SINGULAIR) 10 MG tablet TAKE 1 TABLET AT BEDTIME 90 tablet 3   No current facility-administered medications on file prior to visit.    Past Medical History:  Diagnosis Date   Allergy    Arthritis    fingers   Asthma    Hypertension    Sleep apnea    cpap   Allergies  Allergen Reactions   Amoxicillin Hives   Bactrim [Sulfamethoxazole-Trimethoprim] Swelling    Anaphylaxis    Clindamycin/Lincomycin Swelling    Anaphylaxis    Hydrocodone     Nausea vomiting   Keflex [Cephalexin] Swelling    Anaphylaxis    Lisinopril     headache    Social History   Socioeconomic History   Marital status: Married    Spouse name: Not on file   Number of children: Not on file   Years of  education: Not on file   Highest education level: Bachelor's degree (e.g., BA, AB, BS)  Occupational History   Not on file  Tobacco Use   Smoking status: Never    Passive exposure: Yes   Smokeless tobacco: Never  Vaping Use   Vaping status: Never Used  Substance and Sexual Activity   Alcohol use: No   Drug use: No   Sexual activity: Yes  Other Topics Concern   Not on file  Social History Narrative   Not on file   Social Drivers of Health   Financial Resource Strain: Low Risk  (09/21/2023)   Overall Financial Resource Strain (CARDIA)    Difficulty of Paying Living Expenses: Not hard at all  Food Insecurity: No Food Insecurity (09/21/2023)    Hunger Vital Sign    Worried About Running Out of Food in the Last Year: Never true    Ran Out of Food in the Last Year: Never true  Transportation Needs: No Transportation Needs (09/21/2023)   PRAPARE - Administrator, Civil Service (Medical): No    Lack of Transportation (Non-Medical): No  Physical Activity: Sufficiently Active (09/21/2023)   Exercise Vital Sign    Days of Exercise per Week: 3 days    Minutes of Exercise per Session: 60 min  Stress: No Stress Concern Present (09/21/2023)   [employer] of Occupational Health - Occupational Stress Questionnaire    Feeling of Stress : Not at all  Social Connections: Socially Isolated (09/21/2023)   Social Connection and Isolation Panel [NHANES]    Frequency of Communication with Friends and Family: Once a week    Frequency of Social Gatherings with Friends and Family: Once a week    Attends Religious Services: Never    Database administrator or Organizations: No    Attends Engineer, structural: Not on file    Marital Status: Married    Vitals:   10/28/23 0938  BP: 124/80  Pulse: (!) 104  Resp: 16  Temp: 99.8 F (37.7 C)  SpO2: 98%   Body mass index is 33.01 kg/m.  Physical Exam Vitals and nursing note reviewed.  Constitutional:      General: She is not in acute distress.    Appearance: She is well-developed. She is not ill-appearing.  HENT:     Head: Normocephalic and atraumatic.     Right Ear: Ear canal and external ear normal. A middle ear effusion is present. Tympanic membrane is not erythematous.     Left Ear: Tympanic membrane, ear canal and external ear normal.     Nose: Congestion and rhinorrhea present.     Mouth/Throat:     Pharynx: Postnasal drip present.  Eyes:     Conjunctiva/sclera: Conjunctivae normal.  Cardiovascular:     Rate and Rhythm: Regular rhythm. Tachycardia present.     Heart sounds: No murmur heard. Pulmonary:     Effort: Pulmonary effort is normal. No respiratory  distress.     Breath sounds: Normal breath sounds. No stridor.  Lymphadenopathy:     Head:     Right side of head: No submandibular adenopathy.     Left side of head: No submandibular adenopathy.     Cervical: No cervical adenopathy.  Skin:    General: Skin is warm.     Findings: No erythema or rash.  Neurological:     General: No focal deficit present.     Mental Status: She is alert and oriented to person, place, and  time.     Gait: Gait normal.  Psychiatric:        Mood and Affect: Mood and affect normal.   ASSESSMENT AND PLAN:  Wanda Thornton was seen today for fever, headache, and congestion.   Fever, unspecified fever cause COVID-19 and rapid flu test negative. Instructed to recheck for COVID-19 at home, if possible, tomorrow if fever reoccurs. Continue Tylenol 500 mg 3-4 times per day as needed. Continue monitoring temperature.  -     POCT Influenza A/B -     POC COVID-19 BinaxNow  URI, acute Symptoms suggests a viral etiology, symptomatic treatment recommended. She is feeling better today. Instructed to monitor for signs of complications. Auto inflation maneuvers recommended to help with right ear discomfort, which has improved, possible eustachian tube dysfunction. Nasal saline irrigations as needed throughout the day and Flonase nasal spray at bedtime as needed for nasal congestion and rhinorrhea. Monitor for new symptoms. Instructed about warning signs. F/U as needed.  Return if symptoms worsen or fail to improve.  I, Rolla Etienne Wierda, acting as a scribe for Chantrell Apsey [place], MD., have documented all relevant documentation on the behalf of Allexa Acoff [place], MD, as directed by  Damonica Chopra [place], MD while in the presence of Ki Corbo [place], MD.   I, Rae Sutcliffe [place], MD, have reviewed all documentation for this visit. The documentation on 10/28/23 for the exam, diagnosis, procedures, and orders are all accurate and complete.  Carmalita Wakefield G. [place], MD  [facility] [place]  office.

## 2023-11-07 ENCOUNTER — Telehealth: Payer: Self-pay

## 2023-11-07 ENCOUNTER — Ambulatory Visit: Payer: 59

## 2023-11-07 NOTE — Telephone Encounter (Signed)
 I called and left patient a voicemail. She is on the nurse visit schedule today for a RSV vaccine. We do not carry this vaccine in the office. I let her know and that she could get it from the local pharmacy. Will send mychart message as well.

## 2023-12-04 ENCOUNTER — Telehealth: Payer: Self-pay

## 2023-12-04 ENCOUNTER — Ambulatory Visit: Payer: 59 | Admitting: Internal Medicine

## 2023-12-04 VITALS — BP 141/87 | HR 102 | Temp 98.4°F | Ht 62.0 in | Wt 183.0 lb

## 2023-12-04 DIAGNOSIS — J4540 Moderate persistent asthma, uncomplicated: Secondary | ICD-10-CM

## 2023-12-04 DIAGNOSIS — R918 Other nonspecific abnormal finding of lung field: Secondary | ICD-10-CM

## 2023-12-04 NOTE — Progress Notes (Addendum)
 OV 03/26/2023 -   Subjective:  Patient ID: Wanda Thornton, female , DOB: 1960-11-29 , age 63 y.o. , MRN: [id number] , ADDRESS: [address] [place] [address] PCP [place], Betty G, MD Patient Care Team: [place], Betty G, MD as PCP - General (Family Medicine)  This Provider for this visit: Treatment Team:  Attending Provider: Kalman Shan, MD    03/26/2023 -   Chief Complaint  Patient presents with   Consult    Consult for Abnormal CT scan of lung.     HPI Wanda Thornton 63 y.o. -this is a transfer of care from Dr. Jetty Duhamel.  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  She states she has a history of allergies for which she established with Dr. Jetty Duhamel.  She also has history of sleep apnea and she is on CPAP and she established for this with Dr. Jetty Duhamel.  In July 2022 she had normal IgE and eosinophils of 400.  In June 2023 she had normal pulmonary function test.  In December 2022 she had normal angiotensin-converting enzyme and also hypersensitive pneumonitis panel.  In 2018 she had RAST allergy panel is positive for almond and Timothy grass.  There is a CT scan in February 2022 hide resolution CT chest that was interpreted by Dr. Trudie Reed.  It is documented below.  He felt there might be some early ILD.  I personally visualized it all I see is micronodularity on the upper lobes but there is no air trapping of this no classical ILD.  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  Patient was recently aware of this result and she requested being seen by an ILD physician.  Therefore she is here.  She does not have any fixed exertional dyspnea.  She is really worried about the CT scan results.  Therefore she is here.  Most recently she feels her allergies have been acting up because of some  dust exposure and also the weather changes.  She just finished a prednisone taper today.  She is partly partially better but she is definitely better.  With a history of seasonal allergies she used to be on allergy shots 15 years ago another in [place] but this would make her wheeze so she stopped doing it.\  She is also expressed desire and changing doctors.  I recommended a sleep physician within our practice.  And also establishing with allergy physician outside of practice.  She is okay with this.  CT Chest data 2022 personally visualized and independently interpreted and my findings are: - Not convinced about ILD arrative & Impression  CLINICAL DATA:  63 year old female with history of chest congestion and productive cough for 1 year. Evaluate for interstitial lung disease.   EXAM: CT CHEST WITHOUT CONTRAST   TECHNIQUE: Multidetector CT imaging of the chest was performed following the standard protocol without intravenous contrast. High resolution imaging of the lungs, as well as inspiratory and expiratory imaging, was performed.   COMPARISON:  No priors.   FINDINGS: Cardiovascular: Heart size is normal. There is no significant pericardial fluid, thickening or pericardial calcification. Aortic atherosclerosis. No definite coronary artery calcifications.   Mediastinum/Nodes: No pathologically enlarged mediastinal or hilar lymph nodes. Please note that accurate exclusion of hilar adenopathy is limited on noncontrast CT scans. Esophagus is unremarkable in  appearance. No axillary lymphadenopathy.   Lungs/Pleura: High-resolution images demonstrate a few areas of mild cylindrical bronchiectasis with some mild thickening of the peribronchovascular interstitium, regional architectural distortion, small amount of peripheral bronchiolectasis, and some peribronchovascular micro nodularity. These findings are most evident in the upper lobes of the lungs bilaterally,  particularly near the lung apices. No other generalized regions of ground-glass attenuation, septal thickening, subpleural reticulation or honeycombing are noted. Lung bases in particular appear essentially spared. Very mild micro nodularity also noted in association with the major fissures of the lungs. No acute consolidative airspace disease. No pleural effusions. No larger more suspicious appearing pulmonary nodules or masses are noted. Linear scarring or subsegmental atelectasis in the left lower lobe.   Upper Abdomen: Unremarkable.   Musculoskeletal: There are no aggressive appearing lytic or blastic lesions noted in the visualized portions of the skeleton.   IMPRESSION: 1. The appearance of the lungs is unusual, with upper lung predominant mild bronchiectasis and peribronchovascular micronodularity. Findings are not favored to represent interstitial lung disease, but may represent sequela of chronic indolent atypical infectious process. Alternatively, these findings could be seen in the setting of a systemic disease such as sarcoidosis. Notably, there is no mediastinal or hilar lymphadenopathy noted on today's examination. 2. Aortic atherosclerosis.   Aortic Atherosclerosis (ICD10-I70.0).     Electronically Signed   By: Trudie Reed M.D.   On: 11/01/2020 08:36      OV 06/30/2023  Subjective:  Patient ID: Wanda Thornton, female , DOB: 07-31-1961 , age 63 y.o. , MRN: [id number] , ADDRESS: [address] [place] [address] PCP [place], Betty G, MD Patient Care Team: [place], Betty G, MD as PCP - General (Family Medicine)  This Provider for this visit: Treatment Team:  Attending Provider: Kalman Shan, MD    06/30/2023 -   Chief Complaint  Patient presents with   Follow-up    PFT Results     HPI Wanda Thornton 63 y.o.   #Follow-up upper lobe micronodularity.: -discussed in April 08, 2023 for interstitial lung disease conference.  She  does not have ILD.  She likely has postinflammatory consequences in the upper lobe.  Serial monitoring with conservative management is recommended and advised to get rid of her down comforter.  She tells me today that she did get rid of the got down comforter.  She says no interim health issues other than this morning her husband cleaning the bathroom with some chemicals in her nose stuffed up.  Her blood QuantiFERON gold and autoimmune panels all negative.  She had repeat pulmonary function test normal.  She had repeat high-resolution CT chest in July 2024.  Personally visualized and compared to 2022 no change and I agree with the thoracic radiologist on this.  She had pulmonary function test and it is normal   #Possible sleep apnea/like ongoing sleep apnea: She saw Dr Val Eagle 06/23/23 per chart review. He has asked her to continue cPAP  #Mild allergies seasonal: Last admitted referral to the allergist but she has not seen 1.  She is willing to get a referral.  #Social - Brother died in [place].  She is up-to-date with her COVID-vaccine.  She had a flu shot today.  She will have RSV shot commercially.  Educated t her about this.   CT Chest data from date: HRCT 04/04/23  - personally visualized and independently interpreted : yes - my findings are: per official  IMPRESSION: 1. The appearance of the lungs is stable compared to  the prior study, once again favored to reflect an alternative diagnosis (not usual interstitial pneumonia) per current ATS guidelines. Findings are once again favored to reflect either chronic atypical infection, or a systemic disease such as sarcoidosis. 2. Aortic atherosclerosis.   Aortic Atherosclerosis (ICD10-I70.0).     Electronically Signed   By: Trudie Reed M.D.   On: 04/07/2023 11:51   OV 12/04/2023  Subjective:  Patient ID: Wanda Thornton, female , DOB: January 26, 1961 , age 4 y.o. , MRN: [id number] , ADDRESS: [address] [place]  [address] PCP [place], Betty G, MD Patient Care Team: [place], Betty G, MD as PCP - General (Family Medicine)  This Provider for this visit: Treatment Team:  Attending Provider: Kalman Shan, MD    12/04/2023 -   Chief Complaint  Patient presents with   Follow-up    ild, had pft done in September 2024     HPI Wanda Thornton 63 y.o. -follow-up upper lobe nodularity.  I saw her in September 2024.  At that time we decided to a pulm function test in 1 year and see her back.  At some point last month or in January 2025 and I was reviewing her chart I did not realize the fact that I saw her in September 2024.  I thought I had seen in June 2024 and did not see her in September to discuss the results.  Therefore I had her scheduled this visit.  So she made this visit and she is puzzled why she is made this visit.  I apologize for the confusion.  In any event she feels well.  Since her last visit she has had urgent care visit for cold but otherwise no new medical problems no surgeries no emergency room visits no changes in her medications and she feels stable.  We visualized the CT scan again and she has 2-1/2-year stability of upper lobe micronodularity. - got 2nd opiion from Dr Fredirick Lathe, "The most recent CT Chest we have is from 04/04/23. I don't see any nodules requiring specific followup. Looks like sarcoid."   SYMPTOM SCALE - ILD 12/04/2023  Current weight   O2 use 0  Shortness of Breath 0 -> 5 scale with 5 being worst (score 6 If unable to do)  At rest 0  Simple tasks - showers, clothes change, eating, shaving 0  Household (dishes, doing bed, laundry) 0  Shopping 0  Walking level at own pace 0  Walking up Stairs 0  Total (30-36) Dyspnea Score 0  How bad is your cough? 0  How bad is your fatigue 0  How bad is nausea 00  How bad is vomiting?  0  How bad is diarrhea? 0  How bad is anxiety? 0  How bad is depression 0  Any chronic pain - if so where and how bad 0      Simple  office walk 224 (66+46 x 2) feet Pod A at [facility] x  3 laps goal with forehead probe 12/04/2023    O2 used ra   Number laps completed Sit and stand x15   Comments about pace std   Resting Pulse Ox/HR 100% and 99/min   Final Pulse Ox/HR 100% and 120/min   Desaturated </= 88% x   Desaturated <= 3% points x   Got Tachycardic >/= 90/min x   Symptoms at end of test x   Miscellaneous comments x        PFT     Latest Ref  Rng & Units 06/30/2023   10:02 AM 03/01/2022    9:04 AM  PFT Results  FVC-Pre L 3.01  2.90   FVC-Predicted Pre % 101  121   FVC-Post L  2.88   FVC-Predicted Post %  121   Pre FEV1/FVC % % 89  88   Post FEV1/FCV % %  90   FEV1-Pre L 2.69  2.56   FEV1-Predicted Pre % 116  136   FEV1-Post L  2.61   DLCO uncorrected ml/min/mmHg 18.99  21.60   DLCO UNC% % 103  117   DLCO corrected ml/min/mmHg 18.99  21.60   DLCO COR %Predicted % 103  117   DLVA Predicted % 100  113   TLC L  4.60   TLC % Predicted %  99   RV % Predicted %  82        LAB RESULTS last 96 hours No results found.       has a past medical history of Allergy, Arthritis, Asthma, Hypertension, and Sleep apnea.   reports that she has never smoked. She has been exposed to tobacco smoke. She has never used smokeless tobacco.  Past Surgical History:  Procedure Laterality Date   BREAST BIOPSY Right    PARTIAL HYSTERECTOMY  2005   TRIGGER FINGER RELEASE     wisdom teeth      Allergies  Allergen Reactions   Amoxicillin Hives   Bactrim [Sulfamethoxazole-Trimethoprim] Swelling    Anaphylaxis    Clindamycin/Lincomycin Swelling    Anaphylaxis    Hydrocodone     Nausea vomiting   Keflex [Cephalexin] Swelling    Anaphylaxis    Lisinopril     headache    Immunization History  Administered Date(s) Administered   Influenza, Seasonal, Injecte, Preservative Fre 06/30/2023   Influenza,inj,Quad PF,6+ Mos 10/16/1998, 06/17/2017, 06/17/2018, 06/29/2019, 07/10/2020, 07/23/2021, 07/19/2022    Moderna Sars-Covid-2 Vaccination 12/16/2019, 01/18/2020, 08/05/2020, 02/22/2021, 07/08/2021   Pfizer Covid-19 Vaccine Bivalent Booster 5y-11y 06/29/2022   Pneumococcal Polysaccharide-23 11/07/2015   Tdap 09/15/2009, 10/08/2019   Zoster Recombinant(Shingrix) 07/19/2022, 09/18/2022    Family History  Problem Relation Age of Onset   Hyperlipidemia Mother    Hypertension Mother    Hypertension Father    Hyperlipidemia Father    Diabetes Sister    Cancer Sister        breast   Breast cancer Sister    Diabetes Sister    Cancer Sister        breast   Breast cancer Sister    Breast cancer Maternal Aunt    Colon cancer Neg Hx    Colon polyps Neg Hx    Esophageal cancer Neg Hx    Rectal cancer Neg Hx    Stomach cancer Neg Hx      Current Outpatient Medications:    albuterol (VENTOLIN HFA) 108 (90 Base) MCG/ACT inhaler, USE 2 INHALATIONS EVERY 4 TO 6 HOURS AS NEEDED FOR DYSPNEA AND WHEEZING, Disp: 25.5 g, Rfl: 3   amLODipine (NORVASC) 10 MG tablet, TAKE 1 TABLET DAILY, Disp: 90 tablet, Rfl: 3   B Complex-C (B-COMPLEX WITH VITAMIN C) tablet, Take 1 tablet by mouth daily., Disp: , Rfl:    CALCIUM CITRATE PO, Take 2 tablets by mouth 2 (two) times daily., Disp: , Rfl:    cholecalciferol (VITAMIN D) 1000 units tablet, Take 2,000 Units by mouth daily. , Disp: , Rfl:    CRANBERRY PO, Take 1 tablet by mouth daily., Disp: , Rfl:    fexofenadine (  ALLEGRA) 180 MG tablet, Take 180 mg by mouth daily., Disp: , Rfl:    fluticasone (FLONASE) 50 MCG/ACT nasal spray, USE 2 SPRAYS IN EACH NOSTRIL DAILY AS NEEDED FOR ALLERGIES, Disp: 48 g, Rfl: 3   fluticasone-salmeterol (ADVAIR) 250-50 MCG/ACT AEPB, Inhale 1 puff then rinse mouth, twice daily, Disp: 180 each, Rfl: 4   KLOR-CON M20 20 MEQ tablet, TAKE 1 TABLET TWICE A DAY, Disp: 180 tablet, Rfl: 3   Magnesium 250 MG CAPS, , Disp: , Rfl:    montelukast (SINGULAIR) 10 MG tablet, TAKE 1 TABLET AT BEDTIME, Disp: 90 tablet, Rfl: 3      Objective:    Vitals:   12/04/23 1536  BP: (!) 141/87  Pulse: (!) 102  Temp: 98.4 F (36.9 C)  TempSrc: Oral  SpO2: 97%  Weight: 183 lb (83 kg)  Height: 5\' 2"  (1.575 m)    Estimated body mass index is 33.47 kg/m as calculated from the following:   Height as of this encounter: 5\' 2"  (1.575 m).   Weight as of this encounter: 183 lb (83 kg).  @WEIGHTCHANGE @  [employer]   12/04/23 1536  Weight: 183 lb (83 kg)     Physical Exam   General: No distress. Lookk swell O2 at rest: no Cane present: no Sitting in wheel chair: no Frail: no Obese: no Neuro: Alert and Oriented x 3. GCS 15. Speech normal Psych: Pleasant Resp:  Barrel Chest - no.  Wheeze - no, Crackles - no, No overt respiratory distress CVS: Normal heart sounds. Murmurs - no Ext: Stigmata of Connective Tissue Disease - no HEENT: Normal upper airway. PEERL +. No post nasal drip        Assessment:       ICD-10-CM   1. Abnormal CT scan of lung  R91.8 Pulmonary function test    2. Multiple lung nodules on CT  R91.8 Pulmonary function test         Plan:     Patient Instructions  Abnormal CT scan of lung Multiple lung nodules on CT  -The CT scan (2022) and 2024  does not look like classic pulmonary fibrosis for me.  It seems of some micronodularity on the top.  It is stable x 2.5 years through summer 2024.  This is very reassuring.  Especially because the pulmonary function test is normal.  Autoimmune and blood TB test are negative/normal.  Glad you got rid of the down comforter at home.  Unclear cause but could be burned-out sarcoid  Plan  - Do full pulmonary function test in 12 months     Asthma, extrinsic, moderate persistent, uncomplicated Seasonal and perennial allergic rhinitis  -Mild intermittent and ongoing  Plan  -PER PCP v allregies  Follouwp  - 12  months but after completing PFT   FOLLOWUP Return in about 1 year (around 12/03/2024) for 15 min visit, with Dr Marchelle Gearing, Face to Face  Visit.    SIGNATURE    Dr. Kalman Shan, M.D., F.C.C.P,  Pulmonary and Critical Care Medicine Staff Physician, [facility] Center Director - Interstitial Lung Disease  Program  Pulmonary Fibrosis [facility] at [facility] [place], [place], [address]  Pager: [phone], If no answer or between  15:00h - 7:00h: call [phone] Telephone: [phone]  4:23 PM 12/04/2023

## 2023-12-04 NOTE — Telephone Encounter (Signed)
 Called patient and left voice message on cell phone per Dr. Marchelle Gearing there is no need for another scan.

## 2023-12-04 NOTE — Patient Instructions (Addendum)
 Abnormal CT scan of lung Multiple lung nodules on CT  -The CT scan (2022) and 2024  does not look like classic pulmonary fibrosis for me.  It seems of some micronodularity on the top.  It is stable x 2.5 years through summer 2024.  This is very reassuring.  Especially because the pulmonary function test is normal.  Autoimmune and blood TB test are negative/normal.  Glad you got rid of the down comforter at home.  Unclear cause but could be burned-out sarcoid  Plan  - Do full pulmonary function test in 12 months     Asthma, extrinsic, moderate persistent, uncomplicated Seasonal and perennial allergic rhinitis  -Mild intermittent and ongoing  Plan  -PER PCP v allregies  Follouwp  - 12  months but after completing PFT

## 2024-03-02 ENCOUNTER — Ambulatory Visit: Payer: 59 | Admitting: Internal Medicine

## 2024-04-03 ENCOUNTER — Other Ambulatory Visit: Payer: Self-pay | Admitting: Internal Medicine

## 2024-05-01 ENCOUNTER — Other Ambulatory Visit: Payer: Self-pay | Admitting: Family Medicine

## 2024-05-01 DIAGNOSIS — I10 Essential (primary) hypertension: Secondary | ICD-10-CM

## 2024-05-01 DIAGNOSIS — E876 Hypokalemia: Secondary | ICD-10-CM

## 2024-05-10 ENCOUNTER — Other Ambulatory Visit: Payer: Self-pay | Admitting: Family Medicine

## 2024-05-10 DIAGNOSIS — E876 Hypokalemia: Secondary | ICD-10-CM

## 2024-05-10 DIAGNOSIS — I10 Essential (primary) hypertension: Secondary | ICD-10-CM

## 2024-05-10 MED ORDER — POTASSIUM CHLORIDE CRYS ER 20 MEQ PO TBCR: 20.0000 meq | EXTENDED_RELEASE_TABLET | Freq: Two times a day (BID) | ORAL | 3 refills | Status: AC

## 2024-05-10 MED ORDER — AMLODIPINE BESYLATE 10 MG PO TABS: 10.0000 mg | ORAL_TABLET | Freq: Every day | ORAL | 3 refills | Status: AC

## 2024-05-10 NOTE — Telephone Encounter (Signed)
 Copied from CRM #[id number]. Topic: Clinical - Medication Refill >> May 10, 2024  8:10 AM Carlatta H wrote: Medication: amLODipine  (NORVASC ) 10 MG tablet KLOR-CON  M20 20 MEQ tablet  Has the patient contacted their pharmacy? No (Agent: If no, request that the patient contact the pharmacy for the refill. If patient does not wish to contact the pharmacy document the reason why and proceed with request.) (Agent: If yes, when and what did the pharmacy advise?)  This is the patient's preferred pharmacy:    [facility] - [name], [place] - [address] [place] [address] Phone: [phone] Fax: [phone]  Is this the correct pharmacy for this prescription? Yes If no, delete pharmacy and type the correct one.   Has the prescription been filled recently? No  Is the patient out of the medication? Yes  Has the patient been seen for an appointment in the last year OR does the patient have an upcoming appointment? Yes  Can we respond through MyChart? Yes  Agent: Please be advised that Rx refills may take up to 3 business days. We ask that you follow-up with your pharmacy.

## 2024-05-12 ENCOUNTER — Other Ambulatory Visit: Payer: Self-pay | Admitting: Family Medicine

## 2024-05-12 DIAGNOSIS — Z1231 Encounter for screening mammogram for malignant neoplasm of breast: Secondary | ICD-10-CM

## 2024-06-17 ENCOUNTER — Encounter: Payer: Self-pay | Admitting: Pulmonary Disease

## 2024-06-17 ENCOUNTER — Ambulatory Visit: Admitting: Pulmonary Disease

## 2024-06-17 VITALS — BP 136/80 | HR 108 | Ht 62.0 in | Wt 183.0 lb

## 2024-06-17 DIAGNOSIS — G4733 Obstructive sleep apnea (adult) (pediatric): Secondary | ICD-10-CM | POA: Diagnosis not present

## 2024-06-17 NOTE — Progress Notes (Signed)
 Wanda Thornton    [id number]    05/11/1961  Primary Care Physician:Jordan, Dickey MATSU, MD  Referring Physician: [place], Betty G, MD [address] [place],  [place] [address]  Chief complaint:   Patient being seen for obstructive sleep apnea  HPI:  Diagnosed with obstructive sleep apnea many years ago Moved here from [place]   Has been doing well with CPAP Has no concerns about her machine Wakes up feeling rested No significant daytime sleepiness  Her health has been relatively stable with no concerns  Follows up with Dr. Geronimo for abnormal CT scan  History of multiple allergies  Outpatient Encounter Medications as of 06/17/2024  Medication Sig   albuterol  (VENTOLIN  HFA) 108 (90 Base) MCG/ACT inhaler USE 2 INHALATIONS EVERY 4 TO 6 HOURS AS NEEDED FOR DYSPNEA AND WHEEZING   amLODipine  (NORVASC ) 10 MG tablet Take 1 tablet (10 mg total) by mouth daily.   B Complex-C (B-COMPLEX WITH VITAMIN C) tablet Take 1 tablet by mouth daily.   CALCIUM CITRATE PO Take 2 tablets by mouth 2 (two) times daily.   cholecalciferol (VITAMIN D ) 1000 units tablet Take 2,000 Units by mouth daily.    CRANBERRY PO Take 1 tablet by mouth daily.   fexofenadine (ALLEGRA) 180 MG tablet Take 180 mg by mouth daily.   fluticasone  (FLONASE ) 50 MCG/ACT nasal spray USE 2 SPRAYS IN EACH NOSTRIL DAILY AS NEEDED FOR ALLERGIES   fluticasone -salmeterol (ADVAIR) 250-50 MCG/ACT AEPB USE 1 INHALATION THEN RINSE MOUTH TWICE A DAY   Magnesium 250 MG CAPS    montelukast  (SINGULAIR ) 10 MG tablet TAKE 1 TABLET AT BEDTIME   potassium chloride  SA (KLOR-CON  M20) 20 MEQ tablet Take 1 tablet (20 mEq total) by mouth 2 (two) times daily.   No facility-administered encounter medications on file as of 06/17/2024.    Allergies as of 06/17/2024 - Review Complete 06/17/2024  Allergen Reaction Noted   Amoxicillin  Hives 04/19/2018   Bactrim [sulfamethoxazole-trimethoprim] Swelling 04/19/2018    Clindamycin/lincomycin Swelling 04/19/2018   Hydrocodone  04/19/2018   Keflex [cephalexin] Swelling 04/19/2018   Lisinopril  04/19/2018    Past Medical History:  Diagnosis Date   Allergy     Arthritis    fingers   Asthma    Hypertension    Sleep apnea    cpap    Past Surgical History:  Procedure Laterality Date   BREAST BIOPSY Right    PARTIAL HYSTERECTOMY  2005   TRIGGER FINGER RELEASE     wisdom teeth      Family History  Problem Relation Age of Onset   Hyperlipidemia Mother    Hypertension Mother    Hypertension Father    Hyperlipidemia Father    Diabetes Sister    Cancer Sister        breast   Breast cancer Sister    Diabetes Sister    Cancer Sister        breast   Breast cancer Sister    Breast cancer Maternal Aunt    Colon cancer Neg Hx    Colon polyps Neg Hx    Esophageal cancer Neg Hx    Rectal cancer Neg Hx    Stomach cancer Neg Hx     Social History   Socioeconomic History   Marital status: Married    Spouse name: Not on file   Number of children: Not on file   Years of education: Not on file   Highest education level: Bachelor's degree (  e.g., BA, AB, BS)  Occupational History   Not on file  Tobacco Use   Smoking status: Never    Passive exposure: Yes   Smokeless tobacco: Never  Vaping Use   Vaping status: Never Used  Substance and Sexual Activity   Alcohol use: No   Drug use: No   Sexual activity: Yes  Other Topics Concern   Not on file  Social History Narrative   Not on file   Social Drivers of Health   Financial Resource Strain: Low Risk  (09/21/2023)   Overall Financial Resource Strain (CARDIA)    Difficulty of Paying Living Expenses: Not hard at all  Food Insecurity: No Food Insecurity (09/21/2023)   Hunger Vital Sign    Worried About Running Out of Food in the Last Year: Never true    Ran Out of Food in the Last Year: Never true  Transportation Needs: No Transportation Needs (09/21/2023)   PRAPARE - Therapist, art (Medical): No    Lack of Transportation (Non-Medical): No  Physical Activity: Sufficiently Active (09/21/2023)   Exercise Vital Sign    Days of Exercise per Week: 3 days    Minutes of Exercise per Session: 60 min  Stress: No Stress Concern Present (09/21/2023)   [employer] of Occupational Health - Occupational Stress Questionnaire    Feeling of Stress : Not at all  Social Connections: Socially Isolated (09/21/2023)   Social Connection and Isolation Panel    Frequency of Communication with Friends and Family: Once a week    Frequency of Social Gatherings with Friends and Family: Once a week    Attends Religious Services: Never    Database administrator or Organizations: No    Attends Engineer, structural: Not on file    Marital Status: Married  Catering manager Violence: Not on file    Review of Systems  Constitutional:  Negative for fatigue.  Respiratory:  Positive for apnea.   Psychiatric/Behavioral:  Positive for sleep disturbance.     Vitals:   06/17/24 1017  BP: 136/80  Pulse: (!) 108  SpO2: 99%     Physical Exam Constitutional:      Appearance: Normal appearance.  HENT:     Head: Normocephalic.     Nose: Nose normal.     Mouth/Throat:     Mouth: Mucous membranes are moist.  Eyes:     General: No scleral icterus. Cardiovascular:     Rate and Rhythm: Normal rate and regular rhythm.     Heart sounds: No murmur heard.    No friction rub.  Pulmonary:     Effort: No respiratory distress.     Breath sounds: No stridor. No wheezing or rhonchi.  Musculoskeletal:     Cervical back: No rigidity or tenderness.  Neurological:     Mental Status: She is alert.  Psychiatric:        Mood and Affect: Mood normal.      Data Reviewed: CPAP compliance reviewed showing excellent compliance at 100% Average use of 8 hours 28 minutes AutoSet 5-15 Residual AHI 1.2 95 percentile pressure of 8.4   Assessment:  History of  obstructive sleep apnea Compliant with CPAP therapy  No issues tolerating CPAP  Encouraged to continue using CPAP on a nightly basis  Weight loss efforts encouraged  Download from machine shows it is working well   Plan/Recommendations: Continue CPAP nightly  Follow-up a year from now  Encouraged to call with significant  concerns     Jennet Epley MD [facility] 06/17/2024, 10:23 AM  CC: [place], Betty G, MD

## 2024-06-17 NOTE — Patient Instructions (Signed)
 I will see you a year from now  The compliance from your machine shows it is working well and keeping your airway open  No changes need to be made at the present time  Call us  with significant concerns  Continue using the CPAP nightly

## 2024-06-19 ENCOUNTER — Other Ambulatory Visit: Payer: Self-pay | Admitting: Family Medicine

## 2024-06-19 DIAGNOSIS — J4540 Moderate persistent asthma, uncomplicated: Secondary | ICD-10-CM

## 2024-07-02 ENCOUNTER — Ambulatory Visit: Payer: Self-pay

## 2024-07-02 ENCOUNTER — Ambulatory Visit (INDEPENDENT_AMBULATORY_CARE_PROVIDER_SITE_OTHER): Admitting: Family Medicine

## 2024-07-02 VITALS — BP 150/100 | HR 103 | Temp 98.0°F | Ht 62.0 in | Wt 182.0 lb

## 2024-07-02 DIAGNOSIS — R42 Dizziness and giddiness: Secondary | ICD-10-CM | POA: Diagnosis not present

## 2024-07-02 MED ORDER — MECLIZINE HCL 25 MG PO TABS: 25.0000 mg | ORAL_TABLET | Freq: Three times a day (TID) | ORAL | 0 refills | Status: AC | PRN

## 2024-07-02 NOTE — Telephone Encounter (Signed)
 FYI Only or Action Required?: FYI only for provider.  Patient was last seen in primary care on 10/28/2023 by [place], Wanda G, MD.  Called Nurse Triage reporting Dizziness.  Symptoms began yesterday.  Interventions attempted: Rest, hydration, or home remedies.  Symptoms are: unchanged.  Triage Disposition: See Physician Within 24 Hours  Patient/caregiver understands and will follow disposition?: Yes       Copied from CRM [id number]. Topic: Clinical - Red Word Triage >> Jul 02, 2024  8:19 AM Robinson H wrote: [facility] that prompted transfer to Nurse Triage: Dizziness yesterday, better this morning, happened a little yesterday, right hip pain Reason for Disposition  [1] MODERATE dizziness (e.Thornton., interferes with normal activities) AND [2] has NOT been evaluated by doctor (or NP/PA) for this  (Exception: Dizziness caused by heat exposure, sudden standing, or poor fluid intake.)  Answer Assessment - Initial Assessment Questions 1. DESCRIPTION: Describe your dizziness.     I was going to open blinds in the house... and I raised the blinds... I felt dizzy 2. LIGHTHEADED: Do you feel lightheaded? (e.Thornton., somewhat faint, woozy, weak upon standing)     N/a 3. VERTIGO: Do you feel like either you or the room is spinning or tilting? (i.e., vertigo)     The room was shifting 4. SEVERITY: How bad is it?  Do you feel like you are going to faint? Can you stand and walk?     Is still able to walk 5. ONSET:  When did the dizziness begin?     Yesterday x 3 events 6. AGGRAVATING FACTORS: Does anything make it worse? (e.Thornton., standing, change in head position)     Change in position/sudden movements - self resolved 7. HEART RATE: Can you tell me your heart rate? How many beats in 15 seconds?  (Note: Not all patients can do this.)       N/a 8. CAUSE: What do you think is causing the dizziness? (e.Thornton., decreased fluids or food, diarrhea, emotional distress, heat exposure, new  medicine, sudden standing, vomiting; unknown)     Unknown, thinks vertigo - has not been dx with vertigo 9. RECURRENT SYMPTOM: Have you had dizziness before? If Yes, ask: When was the last time? What happened that time?     denies 10. OTHER SYMPTOMS: Do you have any other symptoms? (e.Thornton., fever, chest pain, vomiting, diarrhea, bleeding)       R lower back pain, and R knee. Ears feel stopped up 11. PREGNANCY: Is there any chance you are pregnant? When was your last menstrual period?       N/a  Protocols used: Dizziness - Lightheadedness-A-AH

## 2024-07-02 NOTE — Patient Instructions (Addendum)
-  It was a pleasure to care for you today.  -EKG completed with no significant concerns.  -Orthostatics completed with some changes.  -Suspect symptoms are related to dehydration. Recommend to increase fluid intake to 64-100oz of water day.  -Prescribed Meclizine 25mg  tablet, take 1 tablet every 8 hours as needed for dizziness. Suspect you may not need this. -If symptoms were to change or become worse follow up. If symptoms change over the weekend, follow up at the closes emergency department.  -Send a MyChart early next with a progress of how you are doing.

## 2024-07-02 NOTE — Progress Notes (Signed)
 Acute Office Visit   Subjective:  Patient ID: Wanda Thornton, female    DOB: 1961/05/04, 63 y.o.   MRN: [id number]  Chief Complaint  Patient presents with   Dizziness    HPI: Patient is complaining of dizziness, vertigo. She reports this started yesterday when she bent down to open up the blinds.  It lasted for a few seconds. She had this occur 2 other times yesterday when changing positions quickly. She reports it feels like her the room was shifting. She reports today she feels better. She had open episode when rolling over in bed, but is more brief than yesterday.  Denies any history dizziness, but reports she has had increased stress from work that started about 3 weeks ago. She reports she has an important meeting on Tuesday that will hopefully relieve some of her stress.   Denies any chest pain, SHOB, HA, or lower extremity edema. Reports she will have ear pressure, but does not feel this today.   She reports she usually does not drink a lot of fluids, however with being more stressed, she has decreased her intake of fluids. Averaging about 16oz a day or a little more.  Review of Systems  Neurological:  Positive for dizziness.   See HPI above      Objective:   BP (!) 150/100   Pulse (!) 103   Temp 98 F (36.7 C) (Oral)   Ht 5' 2 (1.575 m)   Wt 182 lb (82.6 kg)   SpO2 98%   BMI 33.29 kg/m    Physical Exam Vitals reviewed.  Constitutional:      General: She is not in acute distress.    Appearance: Normal appearance. She is obese. She is not ill-appearing, toxic-appearing or diaphoretic.  HENT:     Head: Normocephalic and atraumatic.  Eyes:     General:        Right eye: No discharge.        Left eye: No discharge.     Extraocular Movements:     Right eye: Normal extraocular motion and no nystagmus.     Left eye: Normal extraocular motion and no nystagmus.     Conjunctiva/sclera: Conjunctivae normal.  Cardiovascular:     Rate and Rhythm: Normal rate  and regular rhythm.     Heart sounds: Normal heart sounds. No murmur heard.    No friction rub. No gallop.  Pulmonary:     Effort: Pulmonary effort is normal. No respiratory distress.     Breath sounds: Normal breath sounds.  Musculoskeletal:        General: Normal range of motion.  Skin:    General: Skin is warm and dry.  Neurological:     General: No focal deficit present.     Mental Status: She is alert and oriented to person, place, and time. Mental status is at baseline.     Cranial Nerves: Cranial nerves 2-12 are intact. No facial asymmetry.     Motor: No weakness.     Gait: Gait normal.  Psychiatric:        Mood and Affect: Mood normal.        Behavior: Behavior normal.        Thought Content: Thought content normal.        Judgment: Judgment normal.   EKG completed for dizziness. Sinus rhythm with no ST elevation. No previous EKG to compare.     Assessment & Plan:  Dizziness -     EKG  12-Lead -     Orthostatic vital signs -     Meclizine HCl; Take 1 tablet (25 mg total) by mouth 3 (three) times daily as needed for up to 5 days for dizziness.  Dispense: 15 tablet; Refill: 0  -EKG completed with no significant concerns.  -Orthostatics completed with some changes.  -Suspect symptoms are related to dehydration based on positive orthostatics, decrease fluid intake, negative neurological exam, and EKG stable. Recommend to increase fluid intake to 64-100oz of water day.  -Prescribed Meclizine 25mg  tablet, take 1 tablet every 8 hours as needed for dizziness. Suspect she may not need this. -If symptoms were to change or become worse follow up. If symptoms change over the weekend, follow up at the closes emergency department.  -Send a MyChart early next with a progress of how you are doing.   Kashon Kraynak, NP

## 2024-07-03 NOTE — Telephone Encounter (Signed)
 Has an appt 07/21/24. If she feels it is getting worse, we can see her before. If weakness or headache associated, she needs to seek immediate medical attention. Thanks, BJ

## 2024-07-05 NOTE — Telephone Encounter (Signed)
 Spoke with patient. Patient states she has been hydrating more and feels better. Patient states she does not need a sooner appointment and will keep currently scheduled appointment with PCP.

## 2024-07-21 ENCOUNTER — Ambulatory Visit: Payer: Self-pay | Admitting: Family Medicine

## 2024-07-21 ENCOUNTER — Ambulatory Visit (INDEPENDENT_AMBULATORY_CARE_PROVIDER_SITE_OTHER): Admitting: Family Medicine

## 2024-07-21 ENCOUNTER — Encounter: Payer: Self-pay | Admitting: Family Medicine

## 2024-07-21 VITALS — BP 120/78 | HR 98 | Temp 97.9°F | Resp 16 | Ht 62.0 in | Wt 182.8 lb

## 2024-07-21 DIAGNOSIS — Z1329 Encounter for screening for other suspected endocrine disorder: Secondary | ICD-10-CM

## 2024-07-21 DIAGNOSIS — Z130 Encounter for screening for diseases of the blood and blood-forming organs and certain disorders involving the immune mechanism: Secondary | ICD-10-CM

## 2024-07-21 DIAGNOSIS — J3089 Other allergic rhinitis: Secondary | ICD-10-CM

## 2024-07-21 DIAGNOSIS — E559 Vitamin D deficiency, unspecified: Secondary | ICD-10-CM | POA: Diagnosis not present

## 2024-07-21 DIAGNOSIS — I10 Essential (primary) hypertension: Secondary | ICD-10-CM

## 2024-07-21 DIAGNOSIS — Z13228 Encounter for screening for other metabolic disorders: Secondary | ICD-10-CM

## 2024-07-21 DIAGNOSIS — Z0000 Encounter for general adult medical examination without abnormal findings: Secondary | ICD-10-CM

## 2024-07-21 DIAGNOSIS — E785 Hyperlipidemia, unspecified: Secondary | ICD-10-CM

## 2024-07-21 DIAGNOSIS — J302 Other seasonal allergic rhinitis: Secondary | ICD-10-CM

## 2024-07-21 DIAGNOSIS — Z23 Encounter for immunization: Secondary | ICD-10-CM

## 2024-07-21 DIAGNOSIS — E876 Hypokalemia: Secondary | ICD-10-CM

## 2024-07-21 LAB — LIPID PANEL
Cholesterol: 183 mg/dL (ref 0–200)
HDL: 47.3 mg/dL (ref >39.00)
LDL Cholesterol: 121 mg/dL — ABNORMAL HIGH (ref 0–99)
NonHDL: 135.95
Total CHOL/HDL Ratio: 4
Triglycerides: 75 mg/dL (ref 0.0–149.0)
VLDL: 15 mg/dL (ref 0.0–40.0)

## 2024-07-21 LAB — COMPREHENSIVE METABOLIC PANEL WITH GFR
ALT: 19 U/L (ref 0–35)
AST: 22 U/L (ref 0–37)
Albumin: 4.5 g/dL (ref 3.5–5.2)
Alkaline Phosphatase: 97 U/L (ref 39–117)
BUN: 10 mg/dL (ref 6–23)
CO2: 28 meq/L (ref 19–32)
Calcium: 9.3 mg/dL (ref 8.4–10.5)
Chloride: 105 meq/L (ref 96–112)
Creatinine, Ser: 0.75 mg/dL (ref 0.40–1.20)
GFR: 86.25 mL/min (ref >60.00)
Glucose, Bld: 92 mg/dL (ref 70–99)
Potassium: 3.5 meq/L (ref 3.5–5.1)
Sodium: 142 meq/L (ref 135–145)
Total Bilirubin: 1 mg/dL (ref 0.2–1.2)
Total Protein: 7.5 g/dL (ref 6.0–8.3)

## 2024-07-21 LAB — VITAMIN D 25 HYDROXY (VIT D DEFICIENCY, FRACTURES): VITD: 53.47 ng/mL (ref 30.00–100.00)

## 2024-07-21 NOTE — Assessment & Plan Note (Signed)
 Symptoms are adequately controlled with current regimen. Continue Singulair  10 mg daily, which she thinks is helping significantly leading with symptoms. Continue Flonase  nasal spray daily as needed. We discussed son side effects of medications.

## 2024-07-21 NOTE — Assessment & Plan Note (Signed)
 BP adequately controlled. Continue amlodipine  10 mg daily and low-salt diet. Recommend monitoring BP regularly at home. Eye exam is current. As far as problem is stable, annual follow-ups are appropriate.

## 2024-07-21 NOTE — Patient Instructions (Addendum)
 A few things to remember from today's visit:  Routine general medical examination at a health care facility  Screening for endocrine, metabolic and immunity disorder - Plan: Comprehensive metabolic panel with GFR  Hypokalemia - Plan: Comprehensive metabolic panel with GFR  Seasonal and perennial allergic rhinitis  Hypertension, essential, benign - Plan: Comprehensive metabolic panel with GFR  Vitamin D  deficiency, unspecified - Plan: VITAMIN D  25 Hydroxy (Vit-D Deficiency, Fractures)  Need for vaccination - Plan: Flu vaccine trivalent PF, 6mos and older(Flulaval,Afluria,Fluarix,Fluzone), Pneumococcal conjugate vaccine 20-valent (Prevnar 20)  Dyslipidemia (high LDL; low HDL) - Plan: Lipid panel  No changes today. Check blood pressure regularly.  If you need refills for medications you take chronically, please call your pharmacy. Do not use My Chart to request refills or for acute issues that need immediate attention. If you send a my chart message, it may take a few days to be addressed, specially if I am not in the office.  Please be sure medication list is accurate. If a new problem present, please set up appointment sooner than planned today.  Health Maintenance, Female Adopting a healthy lifestyle and getting preventive care are important in promoting health and wellness. Ask your health care provider about: The right schedule for you to have regular tests and exams. Things you can do on your own to prevent diseases and keep yourself healthy. What should I know about diet, weight, and exercise? Eat a healthy diet  Eat a diet that includes plenty of vegetables, fruits, low-fat dairy products, and lean protein. Do not eat a lot of foods that are high in solid fats, added sugars, or sodium. Maintain a healthy weight Body mass index (BMI) is used to identify weight problems. It estimates body fat based on height and weight. Your health care provider can help determine your BMI  and help you achieve or maintain a healthy weight. Get regular exercise Get regular exercise. This is one of the most important things you can do for your health. Most adults should: Exercise for at least 150 minutes each week. The exercise should increase your heart rate and make you sweat (moderate-intensity exercise). Do strengthening exercises at least twice a week. This is in addition to the moderate-intensity exercise. Spend less time sitting. Even light physical activity can be beneficial. Watch cholesterol and blood lipids Have your blood tested for lipids and cholesterol at 61 years of age, then have this test every 5 years. Have your cholesterol levels checked more often if: Your lipid or cholesterol levels are high. You are older than 61 years of age. You are at high risk for heart disease. What should I know about cancer screening? Depending on your health history and family history, you may need to have cancer screening at various ages. This may include screening for: Breast cancer. Cervical cancer. Colorectal cancer. Skin cancer. Lung cancer. What should I know about heart disease, diabetes, and high blood pressure? Blood pressure and heart disease High blood pressure causes heart disease and increases the risk of stroke. This is more likely to develop in people who have high blood pressure readings or are overweight. Have your blood pressure checked: Every 3-5 years if you are 11-71 years of age. Every year if you are 42 years old or older. Diabetes Have regular diabetes screenings. This checks your fasting blood sugar level. Have the screening done: Once every three years after age 20 if you are at a normal weight and have a low risk for diabetes. More often  and at a younger age if you are overweight or have a high risk for diabetes. What should I know about preventing infection? Hepatitis B If you have a higher risk for hepatitis B, you should be screened for this  virus. Talk with your health care provider to find out if you are at risk for hepatitis B infection. Hepatitis C Testing is recommended for: Everyone born from [birth year] through 1963. Anyone with known risk factors for hepatitis C. Sexually transmitted infections (STIs) Get screened for STIs, including gonorrhea and chlamydia, if: You are sexually active and are younger than 61 years of age. You are older than 61 years of age and your health care provider tells you that you are at risk for this type of infection. Your sexual activity has changed since you were last screened, and you are at increased risk for chlamydia or gonorrhea. Ask your health care provider if you are at risk. Ask your health care provider about whether you are at high risk for HIV. Your health care provider may recommend a prescription medicine to help prevent HIV infection. If you choose to take medicine to prevent HIV, you should first get tested for HIV. You should then be tested every 3 months for as long as you are taking the medicine. Pregnancy If you are about to stop having your period (premenopausal) and you may become pregnant, seek counseling before you get pregnant. Take 400 to 800 micrograms (mcg) of folic acid every day if you become pregnant. Ask for birth control (contraception) if you want to prevent pregnancy. Osteoporosis and menopause Osteoporosis is a disease in which the bones lose minerals and strength with aging. This can result in bone fractures. If you are 106 years old or older, or if you are at risk for osteoporosis and fractures, ask your health care provider if you should: Be screened for bone loss. Take a calcium or vitamin D  supplement to lower your risk of fractures. Be given hormone replacement therapy (HRT) to treat symptoms of menopause. Follow these instructions at home: Alcohol use Do not drink alcohol if: Your health care provider tells you not to drink. You are pregnant, may be pregnant,  or are planning to become pregnant. If you drink alcohol: Limit how much you have to: 0-1 drink a day. Know how much alcohol is in your drink. In the [place] one drink equals one 12 oz bottle of beer (355 mL), one 5 oz glass of wine (148 mL), or one 1 oz glass of hard liquor (44 mL). Lifestyle Do not use any products that contain nicotine or tobacco. These products include cigarettes, chewing tobacco, and vaping devices, such as e-cigarettes. If you need help quitting, ask your health care provider. Do not use street drugs. Do not share needles. Ask your health care provider for help if you need support or information about quitting drugs. General instructions Schedule regular health, dental, and eye exams. Stay current with your vaccines. Tell your health care provider if: You often feel depressed. You have ever been abused or do not feel safe at home. Summary Adopting a healthy lifestyle and getting preventive care are important in promoting health and wellness. Follow your health care provider's instructions about healthy diet, exercising, and getting tested or screened for diseases. Follow your health care provider's instructions on monitoring your cholesterol and blood pressure. This information is not intended to replace advice given to you by your health care provider. Make sure you discuss any questions you have with  your health care provider. Document Revised: 02/05/2021 Document Reviewed: 02/05/2021 Elsevier Patient Education  2024 [employer].

## 2024-07-21 NOTE — Assessment & Plan Note (Signed)
 Currently on K-Lor 20 mK twice daily. Primary aldosteronism ruled out by her endocrinologist. Further recommendations according to lab results.

## 2024-07-21 NOTE — Assessment & Plan Note (Signed)
 We discussed the importance of regular physical activity and healthy diet for prevention of chronic illness and/or complications. Preventive guidelines reviewed. Vaccination updated today, Prevnar 20 and flu vaccine given today. Ca++ and vit D supplementation to continue. Mammogram is scheduled for next week. Status post hysterectomy. Next CPE in a year.

## 2024-07-21 NOTE — Assessment & Plan Note (Signed)
 Continue current dose of vitamin D supplementation. Further recommendation will be given according to 25 OH vitamin D result.

## 2024-07-21 NOTE — Progress Notes (Signed)
 Chief Complaint  Patient presents with   Annual Exam   Follow-up   HPI: [name] is a 63 y.o. female with a PMHx significant for HTN, asthma, OSA on CPAP, seasonal allergies, vitamin D  deficiency, HLD, and sarcoidosis, who is here today for her routine physical.  Last CPE: 07/21/23  No new problems since her last visit. She exercises regularly, walks for 15 minutes 4-5 times per week. She eats out frequently, eats vegetables daily. Sleeps about 8 hours per night. She has regular eye exams and visits her dentist twice per year. She does not drink alcohol and no history of tobacco use.  Immunization History  Administered Date(s) Administered   Influenza, Seasonal, Injecte, Preservative Fre 06/30/2023   Influenza,inj,Quad PF,6+ Mos 10/16/1998, 06/17/2017, 06/17/2018, 06/29/2019, 07/10/2020, 07/23/2021, 07/19/2022   Moderna Sars-Covid-2 Vaccination 12/16/2019, 01/18/2020, 08/05/2020, 02/22/2021, 07/08/2021   PFIZER Comirnaty(Gray Top)Covid-19 Tri-Sucrose Vaccine 07/03/2024   Pfizer Covid-19 Vaccine Bivalent Booster 5y-11y 06/29/2022   Pneumococcal Polysaccharide-23 11/07/2015   Tdap 09/15/2009, 10/08/2019   Zoster Recombinant(Shingrix ) 07/19/2022, 09/18/2022   Health Maintenance  Topic Date Due   Pneumococcal Vaccine: 50+ Years (2 of 2 - PCV) 11/06/2016   Influenza Vaccine  04/30/2024   Cervical Cancer Screening (HPV/Pap Cotest)  07/21/2025 (Originally 11/27/2018)   COVID-19 Vaccine (8 - 2025-26 season) 08/28/2024   Mammogram  07/27/2025   Colonoscopy  08/08/2026   DTaP/Tdap/Td (3 - Td or Tdap) 10/07/2029   Hepatitis C Screening  Completed   Zoster Vaccines- Shingrix   Completed   Hepatitis B Vaccines 19-59 Average Risk  Aged Out   HPV VACCINES  Aged Out   Meningococcal B Vaccine  Aged Out   HIV Screening  Discontinued   Hypertension: She is currently taking amlodipine  10 mg daily. She is not monitoring BP at home. Hypokalemia: Currently she is on K-Lor 20  mK twice daily.  Negative for exertional chest pain, dyspnea,  focal weakness, or edema.  Lab Results  Component Value Date   NA 143 07/21/2023   CL 104 07/21/2023   K 3.7 07/21/2023   CO2 31 07/21/2023   BUN 12 07/21/2023   CREATININE 0.75 07/21/2023   GFR 86.86 07/21/2023   CALCIUM 9.3 07/21/2023   ALBUMIN 4.3 07/21/2023   GLUCOSE 95 07/21/2023   Asthma and OSA: Follows with pulmonology regularly.  She wears her CPAP every night. Allergy  rhinitis on Flonase  nasal spray, which she uses daily, and Singulair  10 mg daily at bedtime.  Symptoms exacerbated by strong perfumes and weather changes.  -She sees her endocrinologist yearly for multiple thyroid  nodules.  Lab Results  Component Value Date   TSH 1.52 08/27/2023   Vit D def: She is taking vitamin D  supplementation 2000 units daily.  Lab Results  Component Value Date   VD25OH 50.59 07/21/2023   Review of Systems  Constitutional:  Negative for activity change, appetite change and fever.  HENT:  Negative for mouth sores, sore throat and trouble swallowing.   Eyes:  Negative for redness and visual disturbance.  Respiratory:  Negative for cough, shortness of breath and wheezing.   Cardiovascular:  Negative for chest pain, palpitations and leg swelling.  Gastrointestinal:  Negative for abdominal pain, nausea and vomiting.  Endocrine: Negative for cold intolerance, heat intolerance, polydipsia, polyphagia and polyuria.  Genitourinary:  Negative for decreased urine volume, dysuria, hematuria, vaginal bleeding and vaginal discharge.  Musculoskeletal:  Negative for gait problem and myalgias.  Skin:  Negative for color change and rash.  Allergic/Immunologic: Positive for  environmental allergies.  Neurological:  Negative for syncope, weakness and headaches.  Hematological:  Negative for adenopathy. Does not bruise/bleed easily.  Psychiatric/Behavioral:  Negative for confusion. The patient is not nervous/anxious.   All other systems  reviewed and are negative.  Current Outpatient Medications on File Prior to Visit  Medication Sig Dispense Refill   albuterol  (VENTOLIN  HFA) 108 (90 Base) MCG/ACT inhaler USE 2 INHALATIONS EVERY 4 TO 6 HOURS AS NEEDED FOR DYSPNEA AND WHEEZING 25.5 g 3   amLODipine  (NORVASC ) 10 MG tablet Take 1 tablet (10 mg total) by mouth daily. 90 tablet 3   B Complex-C (B-COMPLEX WITH VITAMIN C) tablet Take 1 tablet by mouth daily.     CALCIUM CITRATE PO Take 2 tablets by mouth 2 (two) times daily.     cholecalciferol (VITAMIN D ) 1000 units tablet Take 2,000 Units by mouth daily.      CRANBERRY PO Take 1 tablet by mouth daily.     fexofenadine (ALLEGRA) 180 MG tablet Take 180 mg by mouth daily.     fluticasone  (FLONASE ) 50 MCG/ACT nasal spray USE 2 SPRAYS IN EACH NOSTRIL DAILY AS NEEDED FOR ALLERGIES 48 g 3   fluticasone -salmeterol (ADVAIR) 250-50 MCG/ACT AEPB USE 1 INHALATION THEN RINSE MOUTH TWICE A DAY 180 each 3   Magnesium 250 MG CAPS      montelukast  (SINGULAIR ) 10 MG tablet TAKE 1 TABLET AT BEDTIME 90 tablet 3   potassium chloride  SA (KLOR-CON  M20) 20 MEQ tablet Take 1 tablet (20 mEq total) by mouth 2 (two) times daily. 180 tablet 3   No current facility-administered medications on file prior to visit.    Past Medical History:  Diagnosis Date   Allergy     Arthritis    fingers   Asthma    Hypertension    Sleep apnea    cpap   Past Surgical History:  Procedure Laterality Date   BREAST BIOPSY Right    PARTIAL HYSTERECTOMY  2005   TRIGGER FINGER RELEASE     wisdom teeth     Allergies  Allergen Reactions   Amoxicillin  Hives   Bactrim [Sulfamethoxazole-Trimethoprim] Swelling    Anaphylaxis    Clindamycin/Lincomycin Swelling    Anaphylaxis    Hydrocodone     Nausea vomiting   Keflex [Cephalexin] Swelling    Anaphylaxis    Lisinopril     headache   Family History  Problem Relation Age of Onset   Hyperlipidemia Mother    Hypertension Mother    Hypertension Father     Hyperlipidemia Father    Diabetes Sister    Cancer Sister        breast   Breast cancer Sister    Diabetes Sister    Cancer Sister        breast   Breast cancer Sister    Breast cancer Maternal Aunt    Colon cancer Neg Hx    Colon polyps Neg Hx    Esophageal cancer Neg Hx    Rectal cancer Neg Hx    Stomach cancer Neg Hx     Social History   Socioeconomic History   Marital status: Married    Spouse name: Not on file   Number of children: Not on file   Years of education: Not on file   Highest education level: Bachelor's degree (e.g., BA, AB, BS)  Occupational History   Not on file  Tobacco Use   Smoking status: Never    Passive exposure: Yes   Smokeless tobacco:  Never  Vaping Use   Vaping status: Never Used  Substance and Sexual Activity   Alcohol use: No   Drug use: No   Sexual activity: Yes  Other Topics Concern   Not on file  Social History Narrative   Not on file   Social Drivers of Health   Financial Resource Strain: Low Risk  (07/17/2024)   Overall Financial Resource Strain (CARDIA)    Difficulty of Paying Living Expenses: Not hard at all  Food Insecurity: No Food Insecurity (07/17/2024)   Hunger Vital Sign    Worried About Running Out of Food in the Last Year: Never true    Ran Out of Food in the Last Year: Never true  Transportation Needs: No Transportation Needs (07/17/2024)   PRAPARE - Administrator, Civil Service (Medical): No    Lack of Transportation (Non-Medical): No  Physical Activity: Sufficiently Active (07/17/2024)   Exercise Vital Sign    Days of Exercise per Week: 4 days    Minutes of Exercise per Session: 50 min  Stress: Stress Concern Present (07/02/2024)   [employer] of Occupational Health - Occupational Stress Questionnaire    Feeling of Stress: To some extent  Social Connections: Unknown (07/17/2024)   Social Connection and Isolation Panel    Frequency of Communication with Friends and Family: Twice a week     Frequency of Social Gatherings with Friends and Family: Patient declined    Attends Religious Services: Patient declined    Database administrator or Organizations: Patient declined    Attends Banker Meetings: Not on file    Marital Status: Married   Today's Vitals   07/21/24 0800  BP: 120/78  Pulse: 98  Temp: 97.9 F (36.6 C)  SpO2: 99%  Weight: 182 lb 12.8 oz (82.9 kg)  Height: 5' 2 (1.575 m)   Body mass index is 33.43 kg/m.  Wt Readings from Last 3 Encounters:  07/21/24 182 lb 12.8 oz (82.9 kg)  07/02/24 182 lb (82.6 kg)  06/17/24 183 lb (83 kg)   Physical Exam Vitals and nursing note reviewed.  Constitutional:      General: She is not in acute distress.    Appearance: She is well-developed.  HENT:     Head: Normocephalic and atraumatic.     Right Ear: Hearing, tympanic membrane, ear canal and external ear normal.     Left Ear: Hearing, tympanic membrane, ear canal and external ear normal.     Mouth/Throat:     Mouth: Mucous membranes are moist.     Pharynx: Oropharynx is clear. Uvula midline.  Eyes:     Extraocular Movements: Extraocular movements intact.     Conjunctiva/sclera: Conjunctivae normal.     Pupils: Pupils are equal, round, and reactive to light.  Neck:     Thyroid : Thyromegaly present. No thyroid  mass.  Cardiovascular:     Rate and Rhythm: Normal rate and regular rhythm.     Pulses:          Dorsalis pedis pulses are 2+ on the right side and 2+ on the left side.     Heart sounds: No murmur heard. Pulmonary:     Effort: Pulmonary effort is normal. No respiratory distress.     Breath sounds: Normal breath sounds.  Abdominal:     Palpations: Abdomen is soft. There is no hepatomegaly or mass.     Tenderness: There is no abdominal tenderness.  Genitourinary:    Comments: No concerns today.  Musculoskeletal:     Right lower leg: No edema.     Left lower leg: No edema.     Comments: No signs of synovitis appreciated.  Lymphadenopathy:      Cervical: No cervical adenopathy.     Upper Body:     Right upper body: No supraclavicular adenopathy.     Left upper body: No supraclavicular adenopathy.  Skin:    General: Skin is warm.     Findings: No erythema or rash.  Neurological:     General: No focal deficit present.     Mental Status: She is alert and oriented to person, place, and time.     Cranial Nerves: No cranial nerve deficit.     Coordination: Coordination normal.     Gait: Gait normal.     Deep Tendon Reflexes:     Reflex Scores:      Bicep reflexes are 2+ on the right side and 2+ on the left side.      Patellar reflexes are 2+ on the right side and 2+ on the left side. Psychiatric:        Mood and Affect: Mood and affect normal.   ASSESSMENT AND PLAN:  [name] was here today for her annual physical examination and follow up.  Orders Placed This Encounter  Procedures   Flu vaccine trivalent PF, 6mos and older(Flulaval,Afluria,Fluarix,Fluzone)   Pneumococcal conjugate vaccine 20-valent (Prevnar 20)   Comprehensive metabolic panel with GFR   Lipid panel   VITAMIN D  25 Hydroxy (Vit-D Deficiency, Fractures)   Lab Results  Component Value Date   NA 142 07/21/2024   CL 105 07/21/2024   K 3.5 07/21/2024   CO2 28 07/21/2024   BUN 10 07/21/2024   CREATININE 0.75 07/21/2024   GFR 86.25 07/21/2024   CALCIUM 9.3 07/21/2024   ALBUMIN 4.5 07/21/2024   GLUCOSE 92 07/21/2024   Lab Results  Component Value Date   ALT 19 07/21/2024   AST 22 07/21/2024   ALKPHOS 97 07/21/2024   BILITOT 1.0 07/21/2024   Lab Results  Component Value Date   VD25OH 53.47 07/21/2024   Lab Results  Component Value Date   CHOL 183 07/21/2024   HDL 47.30 07/21/2024   LDLCALC 121 (H) 07/21/2024   TRIG 75.0 07/21/2024   CHOLHDL 4 07/21/2024  The 10-year ASCVD risk score (Arnett DK, et al., 2019) is: 5.7%   Values used to calculate the score:     Age: 54 years     Clincally relevant sex: Female     Is  Non-Hispanic African American: Yes     Diabetic: No     Tobacco smoker: No     Systolic Blood Pressure: 120 mmHg     Is BP treated: Yes     HDL Cholesterol: 47.3 mg/dL     Total Cholesterol: 183 mg/dL  Routine general medical examination at a health care facility Assessment & Plan: We discussed the importance of regular physical activity and healthy diet for prevention of chronic illness and/or complications. Preventive guidelines reviewed. Vaccination updated today, Prevnar 20 and flu vaccine given today. Ca++ and vit D supplementation to continue. Mammogram is scheduled for next week. Status post hysterectomy. Next CPE in a year.   Screening for endocrine, metabolic and immunity disorder -     Comprehensive metabolic panel with GFR; Future  Hypokalemia Assessment & Plan: Currently on K-Lor 20 mK twice daily. Primary aldosteronism ruled out by her endocrinologist. Further recommendations according to lab  results.  Orders: -     Comprehensive metabolic panel with GFR; Future  Seasonal and perennial allergic rhinitis Assessment & Plan: Symptoms are adequately controlled with current regimen. Continue Singulair  10 mg daily, which she thinks is helping significantly leading with symptoms. Continue Flonase  nasal spray daily as needed. We discussed son side effects of medications.   Hypertension, essential, benign Assessment & Plan: BP adequately controlled. Continue amlodipine  10 mg daily and low-salt diet. Recommend monitoring BP regularly at home. Eye exam is current. As far as problem is stable, annual follow-ups are appropriate.  Orders: -     Comprehensive metabolic panel with GFR; Future  Vitamin D  deficiency, unspecified Assessment & Plan: Continue current dose of vitamin D  supplementation. Further recommendation will be given according to 25 OH vitamin D  result.  Orders: -     VITAMIN D  25 Hydroxy (Vit-D Deficiency, Fractures); Future  Need for  vaccination -     Flu vaccine trivalent PF, 6mos and older(Flulaval,Afluria,Fluarix,Fluzone) -     Pneumococcal conjugate vaccine 20-valent  Dyslipidemia (high LDL; low HDL) Assessment & Plan: Mild. Continue nonpharmacologic treatment. Further recommendation will be given according to lab results.  Orders: -     Lipid panel; Future  Return in 1 year (on 07/21/2025) for CPE, chronic problems.  Lillianah Swartzentruber G. [place], MD  [facility]. [place] [facility].

## 2024-07-21 NOTE — Assessment & Plan Note (Signed)
 Mild. Continue nonpharmacologic treatment. Further recommendation will be given according to lab results.

## 2024-07-28 ENCOUNTER — Ambulatory Visit
Admission: RE | Admit: 2024-07-28 | Discharge: 2024-07-28 | Disposition: A | Source: Ambulatory Visit | Attending: Family Medicine | Admitting: Family Medicine

## 2024-07-28 DIAGNOSIS — Z1231 Encounter for screening mammogram for malignant neoplasm of breast: Secondary | ICD-10-CM

## 2024-08-30 ENCOUNTER — Ambulatory Visit: Payer: 59 | Admitting: Internal Medicine

## 2024-08-30 ENCOUNTER — Encounter: Payer: Self-pay | Admitting: Internal Medicine

## 2024-08-30 VITALS — BP 122/80 | HR 106 | Ht 62.0 in | Wt 178.0 lb

## 2024-08-30 DIAGNOSIS — E042 Nontoxic multinodular goiter: Secondary | ICD-10-CM | POA: Diagnosis not present

## 2024-08-30 NOTE — Progress Notes (Unsigned)
 Name: Wanda Thornton  MRN/ DOB: [id number], July 04, 1961    Age/ Sex: 63 y.o., female     PCP: Jordan, Betty G, MD   Reason for Endocrinology Evaluation: MNG     Initial Endocrinology Clinic Visit: 08/12/2020    PATIENT IDENTIFIER: [name] is a 63 y.o., female with a past medical history of HTN, OSA on CPAP and dyslipidemia . She has followed with [facility] since 08/12/2020 for consultative assistance with management of her MNG.   HISTORICAL SUMMARY:  Pt noted to have thyromegaly during an examination in 06/2019, which prompted a thyroid  ultrasound demonstrating MNG.  She is S/P FNA of the 1.8 cm left inferior nodule with benign cytology on 09/01/2019   Denies radiation exposure      NO FH of thyroid  disease   Abnormal Aldo/renin ratio:  Patient has been noted with suppressed renin 0.14 NG/DL and elevated Aldo/PRA ratio at 142.9 in November 2023.  Saline loading showed normalization of aldosterone at 3.4 NG/DL December 7976.  SUBJECTIVE:     Today (08/30/2024):  [name] is here for a follow up on MNG.  No palpitations  No local neck swelling  No loose stools or diarrhea unless related to gastritis  No tremors    HISTORY:  Past Medical History:  Past Medical History:  Diagnosis Date   Allergy     Arthritis    fingers   Asthma    Hypertension    Sleep apnea    cpap   Past Surgical History:  Past Surgical History:  Procedure Laterality Date   BREAST BIOPSY Right    PARTIAL HYSTERECTOMY  2005   TRIGGER FINGER RELEASE     wisdom teeth     Social History:  reports that she has never smoked. She has been exposed to tobacco smoke. She has never used smokeless tobacco. She reports that she does not drink alcohol and does not use drugs. Family History:  Family History  Problem Relation Age of Onset   Hyperlipidemia Mother    Hypertension Mother    Hypertension Father    Hyperlipidemia Father    Diabetes Sister     Cancer Sister        breast   Breast cancer Sister    Diabetes Sister    Cancer Sister        breast   Breast cancer Sister    Breast cancer Maternal Aunt    Colon cancer Neg Hx    Colon polyps Neg Hx    Esophageal cancer Neg Hx    Rectal cancer Neg Hx    Stomach cancer Neg Hx      HOME MEDICATIONS: Allergies as of 08/30/2024       Reactions   Amoxicillin  Hives   Bactrim [sulfamethoxazole-trimethoprim] Swelling   Anaphylaxis    Clindamycin/lincomycin Swelling   Anaphylaxis    Hydrocodone    Nausea vomiting   Keflex [cephalexin] Swelling   Anaphylaxis    Lisinopril    headache        Medication List        Accurate as of August 30, 2024  7:41 AM. If you have any questions, ask your nurse or doctor.          albuterol  108 (90 Base) MCG/ACT inhaler Commonly known as: VENTOLIN  HFA USE 2 INHALATIONS EVERY 4 TO 6 HOURS AS NEEDED FOR DYSPNEA AND WHEEZING   amLODipine  10 MG tablet Commonly known as: NORVASC  Take 1 tablet (10 mg  total) by mouth daily.   B-complex with vitamin C tablet Take 1 tablet by mouth daily.   CALCIUM CITRATE PO Take 2 tablets by mouth 2 (two) times daily.   cholecalciferol 1000 units tablet Commonly known as: VITAMIN D  Take 2,000 Units by mouth daily.   CRANBERRY PO Take 1 tablet by mouth daily.   fexofenadine 180 MG tablet Commonly known as: ALLEGRA Take 180 mg by mouth daily.   fluticasone  50 MCG/ACT nasal spray Commonly known as: FLONASE  USE 2 SPRAYS IN EACH NOSTRIL DAILY AS NEEDED FOR ALLERGIES   fluticasone -salmeterol 250-50 MCG/ACT Aepb Commonly known as: ADVAIR USE 1 INHALATION THEN RINSE MOUTH TWICE A DAY   Magnesium 250 MG Caps   montelukast  10 MG tablet Commonly known as: SINGULAIR  TAKE 1 TABLET AT BEDTIME   potassium chloride  SA 20 MEQ tablet Commonly known as: Klor-Con  M20 Take 1 tablet (20 mEq total) by mouth 2 (two) times daily.          OBJECTIVE:   PHYSICAL EXAM: VS: BP (!) 144/84   Pulse  (!) 106   Ht 5' 2 (1.575 m)   Wt 178 lb (80.7 kg)   SpO2 98%   BMI 32.56 kg/m    EXAM: General: Pt appears well and is in NAD  Neck: General: Supple without adenopathy. Thyroid : no nodules appreciated   Lungs: Clear with good BS bilat  Heart: Auscultation: RRR.  Abdomen: Soft, nontender  Extremities:  BL LE: No pretibial edema  Mental Status: Judgment, insight: Intact Orientation: Oriented to time, place, and person Mood and affect: No depression, anxiety, or agitation     DATA REVIEWED:   Latest Reference Range & Units 08/30/24 08:13  TSH 0.40 - 4.50 mIU/L 1.53  Triiodothyronine,Free,Serum 2.3 - 4.2 pg/mL 3.4  T4,Free(Direct) 0.8 - 1.8 ng/dL 1.3     Thyroid  Ultrasound 09/01/2023    Nodule # 1: Small benign colloid nodule in the right mid gland measures less than 1 cm. No further follow-up.   Nodule # 2: The previously biopsied nodule in the left inferior gland remains relatively ill-defined and difficult to measure with precision. The nodule measures approximately 1.3 x 0.9 x 0.7 cm, insignificantly changed compared to prior.   Other: Hypoechoic solid nodule deep to the lower pole of the right thyroid  is stable at 0.8 x 0.4 x 0.7 cm. 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   IMPRESSION: 1. No significant interval change in the size or appearance of the previously biopsied nodule in the left inferior thyroid  gland. Assuming a previous benign biopsy result, no further imaging follow-up is recommended. 2. 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     FNA left inferior 09/01/2019   Clinical History: Nodule 2 Left Inferior 1.8 cm; other 2 dimensions: 1.3 x 0.8 cm, Solid / almost completely solid, Hypoechoic, ACR TI-RADS total points: 4, Moderately suspicious nodule Specimen Submitted:  A. THYROID , LT LOBE [employer], FINE NEEDLE ASPIRATION:   FINAL MICROSCOPIC DIAGNOSIS: - Consistent with benign follicular nodule ([place]  category II)    ASSESSMENT / PLAN / RECOMMENDATIONS:   MNG:  - Patient is clinically euthyroid -No local neck symptoms - S/P benign FNA of the left inferior 1.8 cm nodule in 2020 - Will proceed with repeat thyroid  ultrasound, we discussed this would be the last year if no changes in the nodule - Patient may contact our office with any local neck symptoms - Patient may have annual TFTs through PCPs office - TFTs are normal  F/U pending thyroid  ultrasound results   Signed electronically by: Stefano Redgie Butts, MD  [facility]  [facility] [address] [place] [place], [place] [address] Phone: [phone] FAX: [phone]      CC: Jordan, Betty G, MD [address] [place] [address] Phone: [phone]  Fax: [phone]   Return to Endocrinology clinic as below: Future Appointments  Date Time Provider Department Center  07/22/2025  8:00 AM Jordan, Betty G, MD [facility] [address]

## 2024-08-31 ENCOUNTER — Ambulatory Visit: Payer: Self-pay | Admitting: Internal Medicine

## 2024-08-31 LAB — T4, FREE: Free T4: 1.3 ng/dL (ref 0.8–1.8)

## 2024-08-31 LAB — T3, FREE: T3, Free: 3.4 pg/mL (ref 2.3–4.2)

## 2024-08-31 LAB — TSH: TSH: 1.53 m[IU]/L (ref 0.40–4.50)

## 2024-09-07 ENCOUNTER — Inpatient Hospital Stay: Admission: RE | Admit: 2024-09-07 | Discharge: 2024-09-07 | Attending: Internal Medicine | Admitting: Internal Medicine

## 2024-09-07 DIAGNOSIS — E042 Nontoxic multinodular goiter: Secondary | ICD-10-CM

## 2024-12-03 ENCOUNTER — Encounter

## 2024-12-03 ENCOUNTER — Ambulatory Visit: Admitting: Primary Care

## 2025-07-22 ENCOUNTER — Encounter: Admitting: Family Medicine
